# Patient Record
Sex: Male | Born: 1995 | Race: Black or African American | Hispanic: No | Marital: Single | Smoking: Current every day smoker
Health system: Southern US, Community
[De-identification: ages and names within clinical notes are randomized; demographics above are authoritative.]

---
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
## 2016-02-26 ENCOUNTER — Emergency Department: Payer: Commercial Managed Care - POS

## 2016-02-26 ENCOUNTER — Emergency Department
Admission: EM | Admit: 2016-02-26 | Discharge: 2016-02-26 | Disposition: A | Discharge status: Home / Self Care | Payer: Commercial Managed Care - POS | Attending: Emergency Medicine | Admitting: Emergency Medicine

## 2016-02-26 DIAGNOSIS — H5789 Other specified disorders of eye and adnexa: Secondary | ICD-10-CM

## 2016-02-26 DIAGNOSIS — B309 Viral conjunctivitis, unspecified: Secondary | ICD-10-CM

## 2016-02-26 DIAGNOSIS — F17210 Nicotine dependence, cigarettes, uncomplicated: Secondary | ICD-10-CM | POA: Insufficient documentation

## 2016-02-26 MED ORDER — ERYTHROMYCIN 5 MG/GM OP OINT
TOPICAL_OINTMENT | Freq: Four times a day (QID) | OPHTHALMIC | Status: AC
Start: 2016-02-26 — End: 2016-03-02

## 2016-02-26 NOTE — Discharge Instructions (Signed)
Conjunctivitis    You were diagnosed with conjunctivitis. This is also called "pink eye".    Conjunctivitis is an inflammation of the conjunctiva. These are the thin coverings of the white part of the eye and insides of the eyelids. It is caused by many different things. This includes viruses and bacteria. It even includes chemicals. Particles of "junk" that irritate the eye can also be a cause. Viruses are the most common cause.    Symptoms of conjunctivitis include pink eye and redness and drainage. It may feel like there is something in your eye (foreign body sensation). Your lid may get swollen. Your eyelids may also mat or get stuck in the morning.    Conjunctivitis can be very contagious. This means it can easily spread to others. You SHOULD NOT share hygienic items. This includes towels, make-up and tissues. You SHOULD NOT share clothing items. Wash your hands several times a day. Avoid touching your eyes.    Bacterial conjunctivitis is treated with warm compresses. It is also treated with ophthalmic (eye) antibiotics. These antibiotics are topical (not swallowed). Medicine is generally used for 5-7 days.    You SHOULD NOT wear contact lenses while you have conjunctivitis. Wait 48 hours after the infection completely clears up before using them again.    There is no specific follow-up needed. However, if you get any of the problems listed below, you will need a follow-up. If you don t get better as expected, you will also need a follow-up. It is always a good idea to get rechecked by your eye doctor if possible.    YOU SHOULD SEEK MEDICAL ATTENTION IMMEDIATELY, EITHER HERE OR AT THE NEAREST EMERGENCY DEPARTMENT, IF ANY OF THE FOLLOWING OCCURS:   Increasing eye pain.   Vision problems (problems seeing).   Photophobia (light bothering your eyes).   You don t get better after a few days or symptoms get worse at any time.

## 2016-02-26 NOTE — ED Provider Notes (Signed)
EMERGENCY DEPARTMENT HISTORY AND PHYSICAL EXAM    Date Time: 02/26/2016 4:14 PM  Patient Name: Kenneth Moran, 20 y.o., male  ED Provider: Sonda Primes, MD    History of Presenting Illness:     Chief Complaint: right eye redness  History obtained from: Patient.  Onset/Duration: 3 days ago  Quality: occasional dryness in the eye, none now  Severity:minimal  Aggravating Factors: has a URI, along with his fiance  Alleviating Factors: none  Associated Symptoms: rhinorrhea, cough dry  Narrative/Additional Historical Findings:Kenneth Moran is a 20 y.o. male  Who is presenting with the above cc.  He reports occasional crusting from the right eye.  Denies any f/c.  Denies contact lense use.    Nursing notes from this date of service were reviewed.    Past Medical History:   History reviewed. No pertinent past medical history.    Past Surgical History:   History reviewed. No pertinent past surgical history.    Family History:   History reviewed. No pertinent family history.    Social History:     Social History     Social History   . Marital Status: Single     Spouse Name: N/A   . Number of Children: N/A   . Years of Education: N/A     Social History Main Topics   . Smoking status: Current Every Day Smoker -- 0.50 packs/day     Types: Cigarettes   . Smokeless tobacco: Not on file   . Alcohol Use: No   . Drug Use: Yes     Special: Marijuana   . Sexual Activity: Not on file     Other Topics Concern   . Not on file     Social History Narrative   . No narrative on file       Allergies:   No Known Allergies    Medications:   No current facility-administered medications for this encounter.    Current outpatient prescriptions:   .  erythromycin (ROMYCIN) ophthalmic ointment, Place into the right eye every 6 (six) hours. Apply small ribbon to lower lid 4 times a day for 5 days, Disp: 1 g, Rfl: 0    Review of Systems:   Constitutional: No fever or change in activity.  Eyes: positive for eye redness. No eye discharge.  ENT: No ear  pain or sore throat  Cardiovascular: No cp or palpitations  Respiratory: No cough or shortness of breath.  GI: No vomiting or diarrhea.  Genitourinary: Normal urination frequency  Musculoskeletal: No extremity pain or decreased use  Skin: no rash or skin lesions.  Neurologic: Normal level of alertness    All other systems reviewed and are negative    Physical Exam:   ED Triage Vitals   Enc Vitals Group      BP 02/26/16 1556 142/70 mmHg      Heart Rate 02/26/16 1553 113      Resp Rate 02/26/16 1553 18      Temp 02/26/16 1553 98.6 F (37 C)      Temp Source 02/26/16 1553 Tympanic      SpO2 02/26/16 1553 99 %      Weight 02/26/16 1553 86.183 kg      Height 02/26/16 1553 1.778 m      Head Cir --       Peak Flow --       Pain Score --       Pain Loc --  Pain Edu? --       Excl. in GC? --      Constitutional: Vital signs reviewed. Well hydrated, well perfused, and no increased work of breathing. Appearance: nad  Head:  Normocephalic, atraumatic  Eyes: mild conjunctival injection in the right eye. No discharge. EOMI  ENT: Mucous membranes moist, No oral lesions.  Neck: Normal range of motion. Non-tender.  Respiratory/Chest: Clear to auscultation. No respiratory distress.   Cardiovascular: Regular rate and rhythm. No murmur.   Abdomen: Soft and non-tender. No masses or hepatosplenomegaly.  Genitourinary:  UpperExtremity: No edema or cyanosis.  Moving well.  LowerExtremity: No edema or cyanosis.  Moving well.  Neurological: No focal motor deficits by observation. Speech normal. Memory normal.  Skin: Warm and dry. No rash.  Lymphatic: No cervical lymphadenopathy.  Psychiatric: Normal affect. Normal concentration.    Labs:   Labs Reviewed - No data to display      Rads:     Radiology Results (24 Hour)     ** No results found for the last 24 hours. **          MDM and ED Course   DR. Demarko Zeimet  is the primary attending for this patient and has obtained and performed the history, PE, and medical decision making for  this patient.    MDM:    Suspect viral conjunctivitis, but counseled patient on if his symptoms became worse, with signs of bacterial conjunctivitis, he can start erythromycin ophthalmic ointment.  Visual acuity here was within normal limits.  Given referral to ophthalmologist.    Counseled patient on hand hygiene as well.    Assessment/Plan:   Results and instructions reviewed at the bedside with patient and family.    Clinical Impression  Final diagnoses:   Viral conjunctivitis of right eye   Redness of eye, right       Disposition  ED Disposition     Discharge Kenneth Moran discharge to home/self care.    Condition at disposition: Stable            Prescriptions  New Prescriptions    ERYTHROMYCIN (ROMYCIN) OPHTHALMIC OINTMENT    Place into the right eye every 6 (six) hours. Apply small ribbon to lower lid 4 times a day for 5 days           Signed by: Vito Backers, MD  02/26/16 [phone]

## 2016-02-26 NOTE — ED Notes (Signed)
Pt reports sore throat, redness and itchy eye, congestion x 3 days - reports he has been around family members who have been sick

## 2016-06-05 ENCOUNTER — Emergency Department
Admission: EM | Admit: 2016-06-05 | Discharge: 2016-06-05 | Disposition: A | Discharge status: Home / Self Care | Payer: Commercial Managed Care - POS | Attending: Emergency Medicine | Admitting: Emergency Medicine

## 2016-06-05 ENCOUNTER — Emergency Department: Payer: Commercial Managed Care - POS

## 2016-06-05 ENCOUNTER — Observation Stay: Payer: Commercial Managed Care - POS

## 2016-06-05 DIAGNOSIS — F1910 Other psychoactive substance abuse, uncomplicated: Secondary | ICD-10-CM | POA: Insufficient documentation

## 2016-06-05 DIAGNOSIS — F17210 Nicotine dependence, cigarettes, uncomplicated: Secondary | ICD-10-CM | POA: Insufficient documentation

## 2016-06-05 DIAGNOSIS — R4182 Altered mental status, unspecified: Secondary | ICD-10-CM | POA: Insufficient documentation

## 2016-06-05 LAB — COMPREHENSIVE METABOLIC PANEL
ALT: 24 U/L (ref 0–55)
AST (SGOT): 34 U/L (ref 5–34)
Albumin/Globulin Ratio: 1.2 (ref 0.9–2.2)
Albumin: 4.2 g/dL (ref 3.5–5.0)
Alkaline Phosphatase: 57 U/L (ref 38–106)
Anion Gap: 9 (ref 5.0–15.0)
BUN: 18 mg/dL (ref 9–28)
Bilirubin, Total: 0.6 mg/dL (ref 0.2–1.2)
CO2: 28 mEq/L (ref 22–29)
Calcium: 9.7 mg/dL (ref 8.5–10.5)
Chloride: 109 mEq/L (ref 100–111)
Creatinine: 1.6 mg/dL — ABNORMAL HIGH (ref 0.7–1.3)
Globulin: 3.4 g/dL (ref 2.0–3.6)
Glucose: 111 mg/dL — ABNORMAL HIGH (ref 70–100)
Potassium: 3.5 mEq/L (ref 3.5–5.1)
Protein, Total: 7.6 g/dL (ref 6.0–8.3)
Sodium: 146 mEq/L — ABNORMAL HIGH (ref 136–145)

## 2016-06-05 LAB — URINALYSIS, REFLEX TO MICROSCOPIC EXAM IF INDICATED
Bilirubin, UA: NEGATIVE
Blood, UA: NEGATIVE
Glucose, UA: NEGATIVE
Ketones UA: 20 — AB
Leukocyte Esterase, UA: NEGATIVE
Nitrite, UA: NEGATIVE
Protein, UR: 100 — AB
Specific Gravity UA: 1.035 (ref 1.001–1.035)
Urine pH: 5 (ref 5.0–8.0)
Urobilinogen, UA: 2 mg/dL

## 2016-06-05 LAB — CBC AND DIFFERENTIAL
Absolute NRBC: 0 10*3/uL
Basophils Absolute Automated: 0.02 10*3/uL (ref 0.00–0.20)
Basophils Automated: 0.3 %
Eosinophils Absolute Automated: 0.01 10*3/uL (ref 0.00–0.70)
Eosinophils Automated: 0.1 %
Hematocrit: 40.9 % — ABNORMAL LOW (ref 42.0–52.0)
Hgb: 14.2 g/dL (ref 13.0–17.0)
Immature Granulocytes Absolute: 0.02 10*3/uL
Immature Granulocytes: 0.3 %
Lymphocytes Absolute Automated: 1.03 10*3/uL (ref 0.50–4.40)
Lymphocytes Automated: 13.5 %
MCH: 31.5 pg (ref 28.0–32.0)
MCHC: 34.7 g/dL (ref 32.0–36.0)
MCV: 90.7 fL (ref 80.0–100.0)
MPV: 9.9 fL (ref 9.4–12.3)
Monocytes Absolute Automated: 0.61 10*3/uL (ref 0.00–1.20)
Monocytes: 8 %
Neutrophils Absolute: 5.94 10*3/uL (ref 1.80–8.10)
Neutrophils: 77.8 %
Nucleated RBC: 0 /100 WBC (ref 0.0–1.0)
Platelets: 223 10*3/uL (ref 140–400)
RBC: 4.51 10*6/uL — ABNORMAL LOW (ref 4.70–6.00)
RDW: 13 % (ref 12–15)
WBC: 7.63 10*3/uL (ref 3.50–10.80)

## 2016-06-05 LAB — GFR: EGFR: >60

## 2016-06-05 LAB — SALICYLATE LEVEL: Salicylate Level: <5 mg/dL — ABNORMAL LOW (ref 15.0–30.0)

## 2016-06-05 LAB — GLUCOSE WHOLE BLOOD - POCT: Whole Blood Glucose POCT: 127 mg/dL — ABNORMAL HIGH (ref 70–100)

## 2016-06-05 LAB — RAPID DRUG SCREEN, URINE
Barbiturate Screen, UR: NEGATIVE
Benzodiazepine Screen, UR: POSITIVE — AB
Cannabinoid Screen, UR: POSITIVE — AB
Cocaine, UR: NEGATIVE
Opiate Screen, UR: NEGATIVE
PCP Screen, UR: NEGATIVE
Urine Amphetamine Screen: NEGATIVE

## 2016-06-05 LAB — TROPONIN I: Troponin I: <0.01 ng/mL (ref 0.00–0.09)

## 2016-06-05 LAB — ETHANOL: Alcohol: NOT DETECTED mg/dL

## 2016-06-05 LAB — ACETAMINOPHEN LEVEL: Acetaminophen Level: <7 ug/mL — ABNORMAL LOW (ref 10–30)

## 2016-06-05 MED ORDER — SODIUM CHLORIDE 0.9 % IV BOLUS
1000.0000 mL | Freq: Once | INTRAVENOUS | Status: AC
Start: 2016-06-05 — End: 2016-06-05
  Administered 2016-06-05: 1000 mL via INTRAVENOUS

## 2016-06-05 NOTE — ED Provider Notes (Addendum)
EMERGENCY DEPARTMENT NOTE    Physician/Midlevel provider first contact with patient: 06/05/16 1211         HISTORY OF PRESENT ILLNESS   Historian:Patient  Translator Used: No    Chief Complaint: Altered Mental Status      20 y.o. male who presents altered and accompanied by [facility]. Per EMS and [place] [employer], pt was found earlier today running in front of traffic and banging his head on the asphalt. Pt with prior hx of run-ins with the police. Pt was agitated and requiring IM sedation (versed and ketamine). No deformities noted on exam. Pt was febrile, tachycardic, and hypertensive.     1. Location of symptoms:n/a  2. Onset of symptoms: n/a  3. What was patient doing when symptoms started (Context): see above  4. Severity: moderate  5. Timing: n/a  6. Activities that worsen symptoms: n/a  7. Activities that improve symptoms: n/a  8. Quality: n/a  9. Radiation of symptoms: no  10. Associated signs and Symptoms: see above  11. Are symptoms worsening? yes  MEDICAL HISTORY     Past Medical History:  History reviewed. No pertinent past medical history.    Past Surgical History:  History reviewed. No pertinent surgical history.    Social History:  Social History     Social History   . Marital status: Single     Spouse name: N/A   . Number of children: N/A   . Years of education: N/A     Occupational History   . Not on file.     Social History Main Topics   . Smoking status: Current Every Day Smoker     Packs/day: 0.50     Types: Cigarettes   . Smokeless tobacco: Not on file   . Alcohol use No   . Drug use:      Types: Marijuana   . Sexual activity: Not on file     Other Topics Concern   . Not on file     Social History Narrative   . No narrative on file       Family History:  No family history on file.    Outpatient Medication:  There are no discharge medications for this patient.        REVIEW OF SYSTEMS   Review of Systems  Unable to obtain due to clinical status  PHYSICAL EXAM     ED Triage Vitals [06/05/16 1209]   Enc  Vitals Group      BP 137/71      Heart Rate 87      Resp Rate 14      Temp 99.8 F (37.7 C)      Temp src       SpO2 98 %      Weight 88 kg      Height       Head Circumference       Peak Flow       Pain Score       Pain Loc       Pain Edu?       Excl. in GC?        Constitutional: Vital signs reviewed. Minimally responsive to sternal rub. Localized, opens eyes, no respiratory distress noted  Head:  Normocephalic, atraumatic  Eyes: PERRL, normal conjunctiva bilaterally, EOMI  ENT: Mucous membranes moist.  .  Neck: Normal range of motion. Non-tender.   Respiratory/Chest: clear to auscultation. No work of breathing. No tachypnea..  Cardiovascular: Regular  rate and rhythm. No murmur.   Abdomen: Soft and nontender in all quadrants. No guarding or rebound. No masses or hepatosplenomegaly. -----.  UpperExtremity: No edema or cyanosis.  LowerExtremity: No edema or cyanosis.  Neurological: Awake and alert. No focal motor deficits by observation.  Skin: Warm and dry. No rash.  Lymphatic: No cervical lymphadenopathy.    MEDICAL DECISION MAKING     20 y.o. male who presents altered and accompanied by [place] [employer]    Concern for substance abuse  IVF  Labs including urine drug screen, tylenol and salicylate level pending  Observation in ER   CT head negative for acute pathology     Reassessment/Updates:  - pt sternal rubbed, however pt opens eyes, localized and falls right back asleep. Observed in ER. Will admit for observation.     En route to his room, pt woke up, and got out of bed. Stated to myself and RN he would like to leave the hospital. Pt admits to overdosing on codeine syrup earlier today. IV taken out by RN. Pt walked calmly out the hospital.     DISCUSSION        Vital Signs: Reviewed the patient?s vital signs.   Nursing Notes: Reviewed and utilized available nursing notes.  Medical Records Reviewed: Reviewed available past medical records.  Counseling: The emergency provider has spoken with the patient and discussed  today?s findings, in addition to providing specific details for the plan of care.  Questions are answered and there is agreement with the plan.      CARDIAC STUDIES    The following cardiac studies were independently interpreted by the Emergency Medicine Physician.  For full cardiac study results please see chart.    Monitor Strip  Interpreted by ED Physician  Rate: 64  Rhythm: NSR   ST Changes: none    EKG Interpretation:  Signed and interpreted by ED Physician   Time Interpreted: 12:15  Comparison: none  Rate: 87  Rhythm: NSR  Axis: right  Intervals: normal  Blocks: none  ST segments: no acute ST changes  Interpretation: NSR    IMAGING STUDIES    The following imaging studies were independently interpreted by the Emergency Medicine Physician.  For full imaging study results please see chart.    CXR- no opacities noted, no pleural effusions, no pneumothorax, trachea midline    PULSE OXIMETRY    Oxygen Saturation by Pulse Oximetry: 97%  Interventions: none  Interpretation:  normal    EMERGENCY DEPT. MEDICATIONS      ED Medication Orders     Start Ordered     Status Ordering Provider    06/05/16 1213 06/05/16 1212  sodium chloride 0.9 % bolus 1,000 mL  Once     Route: Intravenous  Ordered Dose: 1,000 mL     Last MAR action:  Stopped [id number], Nicolaus Andel          LABORATORY RESULTS    Ordered and independently interpreted AVAILABLE laboratory tests. Please see results section in chart for full details.  Results for orders placed or performed during the hospital encounter of 06/05/16   CBC with differential   Result Value Ref Range    WBC 7.63 3.50 - 10.80 x10 3/uL    Hgb 14.2 13.0 - 17.0 g/dL    Hematocrit 16.1 (L) 42.0 - 52.0 %    Platelets 223 140 - 400 x10 3/uL    RBC 4.51 (L) 4.70 - 6.00 x10 6/uL    MCV 90.7 80.0 -  100.0 fL    MCH 31.5 28.0 - 32.0 pg    MCHC 34.7 32.0 - 36.0 g/dL    RDW 13 12 - 15 %    MPV 9.9 9.4 - 12.3 fL    Neutrophils 77.8 None %    Lymphocytes Automated 13.5 None %    Monocytes 8.0 None %     Eosinophils Automated 0.1 None %    Basophils Automated 0.3 None %    Immature Granulocyte 0.3 None %    Nucleated RBC 0.0 0.0 - 1.0 /100 WBC    Neutrophils Absolute 5.94 1.80 - 8.10 x10 3/uL    Abs Lymph Automated 1.03 0.50 - 4.40 x10 3/uL    Abs Mono Automated 0.61 0.00 - 1.20 x10 3/uL    Abs Eos Automated 0.01 0.00 - 0.70 x10 3/uL    Absolute Baso Automated 0.02 0.00 - 0.20 x10 3/uL    Absolute Immature Granulocyte 0.02 0 x10 3/uL    Absolute NRBC 0.00 0 x10 3/uL   Comprehensive Metabolic Panel (CMP)   Result Value Ref Range    Glucose 111 (H) 70 - 100 mg/dL    BUN 18 9 - 28 mg/dL    Creatinine 1.6 (H) 0.7 - 1.3 mg/dL    Sodium 956 (H) 213 - 145 mEq/L    Potassium 3.5 3.5 - 5.1 mEq/L    Chloride 109 100 - 111 mEq/L    CO2 28 22 - 29 mEq/L    Calcium 9.7 8.5 - 10.5 mg/dL    Protein, Total 7.6 6.0 - 8.3 g/dL    Albumin 4.2 3.5 - 5.0 g/dL    AST (SGOT) 34 5 - 34 U/L    ALT 24 0 - 55 U/L    Alkaline Phosphatase 57 38 - 106 U/L    Bilirubin, Total 0.6 0.2 - 1.2 mg/dL    Globulin 3.4 2.0 - 3.6 g/dL    Albumin/Globulin Ratio 1.2 0.9 - 2.2    Anion Gap 9.0 5.0 - 15.0   Alcohol (Ethanol) Level   Result Value Ref Range    Alcohol None Detected None Detected mg/dL   Acetaminophen level   Result Value Ref Range    Acetaminophen Level <7 (L) 10 - 30 ug/mL   Salicylate Level   Result Value Ref Range    Salicylate Level <5.0 (L) 15.0 - 30.0 mg/dL   Rapid drug screen, urine   Result Value Ref Range    Amphetamine Screen, UR Negative Negative    Barbiturate Screen, UR Negative Negative    Benzodiazepine Screen, UR Positive (A) Negative    Cannabinoid Screen, UR Positive (A) Negative    Cocaine, UR Negative Negative    Opiate Screen, UR Negative Negative    PCP Screen, UR Negative Negative   GFR   Result Value Ref Range    EGFR >60.0    Troponin I   Result Value Ref Range    Troponin I <0.01 0.00 - 0.09 ng/mL   UA, Reflex to Microscopic   Result Value Ref Range    Urine Type ucc     Color, UA Amber (A) Clear - Yellow    Clarity,  UA Clear Clear - Hazy    Specific Gravity UA 1.035 1.001 - 1.035    Urine pH 5.0 5.0 - 8.0    Leukocyte Esterase, UA Negative Negative    Nitrite, UA Negative Negative    Protein, UR 100 (A) Negative    Glucose, UA Negative Negative  Ketones UA 20 (A) Negative    Urobilinogen, UA 2.0 0.2 - 2.0 mg/dL    Bilirubin, UA Negative Negative    Blood, UA Negative Negative    RBC, UA 0 - 2 0 - 5 /hpf    WBC, UA 0 - 5 0 - 5 /hpf    Squamous Epithelial Cells, Urine 0 - 5 0 - 25 /hpf    Hyaline Casts, UA 11 - 25 (A) 0 - 5 /lpf    Urine Mucus Present None   Glucose Whole Blood - POCT   Result Value Ref Range    POCT - Glucose Whole blood 127 (H) 70 - 100 mg/dL   ECG 12 Lead   Result Value Ref Range    Ventricular Rate 87 BPM    Atrial Rate 87 BPM    P-R Interval 156 ms    QRS Duration 92 ms    Q-T Interval 380 ms    QTC Calculation (Bezet) 457 ms    P Axis 75 degrees    R Axis 93 degrees    T Axis -1 degrees         DIAGNOSIS      Diagnosis:  Final diagnoses:   Altered mental status   Substance abuse       Disposition:  ED Disposition     ED Disposition Condition Date/Time Comment    [place]  Mon Jun 08, 2016 10:18 PM             Prescriptions:  There are no discharge medications for this patient.           Rulon Abide, MD  06/08/16 2219       Rulon Abide, MD  06/08/16 2243

## 2016-06-05 NOTE — ED Triage Notes (Signed)
PT BIBA - AMS - #18 LEFT FA / DEXI =156.  250MG  KETAMINE AND VERSED 5MG  IM GIVEN BY EMS

## 2016-06-06 NOTE — ED Notes (Signed)
Pt became alert during transportation and decided to leave. IV was removed and patient exit the hospital safely.

## 2016-06-07 LAB — ECG 12-LEAD
Atrial Rate: 87 {beats}/min
P Axis: 75 degrees
P-R Interval: 156 ms
Q-T Interval: 380 ms
QRS Duration: 92 ms
QTC Calculation (Bezet): 457 ms
R Axis: 93 degrees
T Axis: -1 degrees
Ventricular Rate: 87 {beats}/min

## 2016-06-26 ENCOUNTER — Emergency Department
Admission: EM | Admit: 2016-06-26 | Discharge: 2016-06-27 | Disposition: A | Discharge status: Home / Self Care | Payer: Commercial Managed Care - POS | Attending: Emergency Medicine | Admitting: Emergency Medicine

## 2016-06-26 ENCOUNTER — Emergency Department: Payer: Commercial Managed Care - POS

## 2016-06-26 DIAGNOSIS — S0181XA Laceration without foreign body of other part of head, initial encounter: Secondary | ICD-10-CM | POA: Insufficient documentation

## 2016-06-26 DIAGNOSIS — S41111A Laceration without foreign body of right upper arm, initial encounter: Secondary | ICD-10-CM

## 2016-06-26 DIAGNOSIS — F17210 Nicotine dependence, cigarettes, uncomplicated: Secondary | ICD-10-CM | POA: Insufficient documentation

## 2016-06-26 MED ORDER — LIDOCAINE-EPINEPHRINE 1 %-1:100000 IJ SOLN
10.0000 mL | Freq: Once | INTRAMUSCULAR | Status: AC
Start: 2016-06-26 — End: 2016-06-27
  Administered 2016-06-27: 10 mL via INTRADERMAL
  Filled 2016-06-26: qty 20
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
## 2016-06-27 MED ORDER — IBUPROFEN 600 MG PO TABS
600.0000 mg | ORAL_TABLET | Freq: Once | ORAL | Status: AC
Start: 2016-06-27 — End: 2016-06-27
  Administered 2016-06-27: 600 mg via ORAL
  Filled 2016-06-27: qty 1

## 2016-06-27 MED ORDER — BACITRACIN ZINC 500 UNIT/GM EX OINT
TOPICAL_OINTMENT | Freq: Once | CUTANEOUS | Status: AC
Start: 2016-06-27 — End: 2016-06-27
  Filled 2016-06-27: qty 0.9

## 2016-06-27 MED ORDER — IBUPROFEN 600 MG PO TABS
600.0000 mg | ORAL_TABLET | Freq: Four times a day (QID) | ORAL | 0 refills | Status: AC | PRN
Start: 2016-06-27 — End: ?

## 2016-06-27 NOTE — Discharge Instructions (Signed)
Laceration, General Wound Care    Use the following wound care instructions for your laceration (cut):   Keep the wound clean and dry for the next 24 hours. Avoid excessive moisture. You can wash the wound gently with soap and water. Then apply a dry bandage.   DO NOT allow your wound to soak in water (don't do the dishes or go swimming, for example). You can shower, but do not rub your stitches too hard. Let the wound dry before putting another bandage on.   Take off old dressings every day. Then put on a clean, dry dressing.   If the dressing sticks to the wound, slightly moisten it with water. This way, it can come off more easily.   To help remove a scab, cleanse the area with a mixture of half hydrogen peroxide and half water. This will also help us to take out the sutures when they are ready to be taken out.   Let the area dry thoroughly.   Unless you receive instructions not to do so, you can place a thin layer of antibiotic ointment over the wound. You can buy Polysporin, Bacitracin, or Neosporin at the store. Neosporin can sometimes cause irritation to your skin. If this happens, stop using it and switch to another topical (surface) antibiotic.   If needed, put a clean, dry bandage over the wound to protect it.    Keep the injured area elevated (lifted) for the next 24 hours. This will decrease swelling and pain. You may also want to put ice on the area. Place some ice cubes in a re-sealable (Ziploc) bag and add some water. Put a thin washcloth between the bag and the skin. Apply the ice bag to the area for at least 20 minutes. Do this at least 4 times per day. It is okay to do this more often than directed. You can also do it for longer than directed. NEVER APPLY ICE DIRECTLY TO THE SKIN.    If you had a local anesthetic, it will wear off in about 2 hours. Until then, be careful not to hurt yourself because of having less feeling in the area.    Not all lacerations (cuts) will need  antibiotics. Your doctor may have decided that you need antibiotics to prevent an infection. Be sure to fill the prescription and take all medicines as directed.    If your doctor gave you a prescription for pain medicine, fill the prescription and use the medicine as directed.    YOU SHOULD SEEK MEDICAL ATTENTION IMMEDIATELY, EITHER HERE OR AT THE NEAREST EMERGENCY DEPARTMENT, IF ANY OF THE FOLLOWING OCCURS:   You see redness or swelling.   There are red streaks or there is redness around the wound.   The wound smells bad or has a lot of drainage.   You have fever (temperature higher than 100.4F or 38C), chills, worse pain and / or swelling.

## 2016-07-07 ENCOUNTER — Emergency Department
Admission: EM | Admit: 2016-07-07 | Discharge: 2016-07-07 | Disposition: A | Discharge status: Home / Self Care | Payer: Commercial Managed Care - POS | Attending: Emergency Medicine | Admitting: Emergency Medicine

## 2016-07-07 DIAGNOSIS — IMO0002 Reserved for concepts with insufficient information to code with codable children: Secondary | ICD-10-CM

## 2016-07-07 DIAGNOSIS — Z4802 Encounter for removal of sutures: Secondary | ICD-10-CM | POA: Insufficient documentation

## 2016-07-07 NOTE — ED Notes (Addendum)
20 yr old male, p/w in ED for suture removal to R arm. No specific number of sutures (to arm) listed on previous visit. Per Dr. Leane Platt, staff should remove every other suture, and send pt home with instructions on how to remove remaining sutures in 2 days from now. 15 intact sutures were counted, and 8 removed. No s/s of infection observed.

## 2016-07-08 NOTE — Discharge Instructions (Signed)
Observe  Remove remainder of sutures in two days

## 2016-07-08 NOTE — ED Provider Notes (Signed)
Physician/Midlevel provider first contact with patient: 07/07/16 2238         History     Chief Complaint   Patient presents with   . Suture / Staple Removal     HPI     Time seen:    Historian: patient    Chief complaint: SR    No past medical history on file.    No past surgical history on file.    No family history on file.    Social  Social History   Substance Use Topics   . Smoking status: Current Every Day Smoker     Packs/day: 0.50     Types: Cigarettes   . Smokeless tobacco: Never Used   . Alcohol use No   .   No Known Allergies    Home Medications             ibuprofen (ADVIL,MOTRIN) 600 MG tablet     Take 1 tablet (600 mg total) by mouth every 6 (six) hours as needed for Pain or Fever.         Review of Systems   Skin: Negative for color change and rash.   Neurological: Negative for weakness and numbness.     Physical Exam       Physical Exam   Skin: Skin is warm and dry.   Wound over right biceps area has healed well with   NSOI, with [id number]-[id number]     MDM and ED Course     ED Medication Orders     None        SR by RN     MDM    ED Course        Procedures    Clinical Impression & Disposition     Clinical Impression  Final diagnoses:   Visit for suture removal   Wound healing well on examination     ED Disposition     ED Disposition Condition Date/Time Comment    Discharge  Wed Jul 08, 2016  7:37 PM Neita Goodnight discharge to home/self care.    Condition at disposition: Stable                       Terri Skains, MD  07/08/16 [phone]

## 2021-03-10 ENCOUNTER — Emergency Department (HOSPITAL_COMMUNITY)
Admission: EM | Admit: 2021-03-10 | Discharge: 2021-03-10 | Disposition: A | Discharge status: Home / Self Care | Payer: No Typology Code available for payment source | Attending: Emergency Medicine | Admitting: Emergency Medicine

## 2021-03-10 ENCOUNTER — Emergency Department (HOSPITAL_COMMUNITY): Payer: No Typology Code available for payment source

## 2021-03-10 DIAGNOSIS — R0789 Other chest pain: Secondary | ICD-10-CM | POA: Diagnosis not present

## 2021-03-10 DIAGNOSIS — M7918 Myalgia, other site: Secondary | ICD-10-CM

## 2021-03-10 DIAGNOSIS — R519 Headache, unspecified: Secondary | ICD-10-CM | POA: Insufficient documentation

## 2021-03-10 DIAGNOSIS — M545 Low back pain, unspecified: Secondary | ICD-10-CM | POA: Insufficient documentation

## 2021-03-10 DIAGNOSIS — R079 Chest pain, unspecified: Secondary | ICD-10-CM | POA: Diagnosis present

## 2021-03-10 DIAGNOSIS — R000 Tachycardia, unspecified: Secondary | ICD-10-CM | POA: Diagnosis not present

## 2021-03-10 DIAGNOSIS — Y92410 Unspecified street and highway as the place of occurrence of the external cause: Secondary | ICD-10-CM | POA: Diagnosis not present

## 2021-03-10 DIAGNOSIS — R1011 Right upper quadrant pain: Secondary | ICD-10-CM | POA: Insufficient documentation

## 2021-03-10 MED ORDER — ACETAMINOPHEN 500 MG PO TABS
1000.0000 mg | ORAL_TABLET | Freq: Once | ORAL | Status: AC
  Administered 2021-03-10: 1000 mg via ORAL
  Filled 2021-03-10: qty 2

## 2021-03-10 MED ORDER — IBUPROFEN 800 MG PO TABS
800.0000 mg | ORAL_TABLET | Freq: Once | ORAL | Status: AC
  Administered 2021-03-10: 800 mg via ORAL
  Filled 2021-03-10: qty 1

## 2021-03-10 NOTE — ED Notes (Signed)
Pt returned from CT °
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
## 2021-03-10 NOTE — ED Notes (Signed)
Pt transported to CT ?
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
## 2022-06-22 ENCOUNTER — Encounter (HOSPITAL_COMMUNITY): Admission: EM | Disposition: A | Discharge status: Home / Self Care | Payer: Self-pay | Source: Home / Self Care

## 2022-06-22 ENCOUNTER — Inpatient Hospital Stay (HOSPITAL_COMMUNITY): Payer: Self-pay

## 2022-06-22 ENCOUNTER — Emergency Department (HOSPITAL_COMMUNITY): Payer: Self-pay

## 2022-06-22 ENCOUNTER — Emergency Department (HOSPITAL_COMMUNITY): Payer: Self-pay | Admitting: Certified Registered Nurse Anesthetist

## 2022-06-22 ENCOUNTER — Other Ambulatory Visit: Payer: Self-pay

## 2022-06-22 ENCOUNTER — Inpatient Hospital Stay (HOSPITAL_COMMUNITY): Payer: Self-pay | Admitting: Certified Registered Nurse Anesthetist

## 2022-06-22 ENCOUNTER — Inpatient Hospital Stay (HOSPITAL_COMMUNITY)
Admission: EM | Admit: 2022-06-22 | Discharge: 2022-07-10 | DRG: 957 | Disposition: A | Discharge status: Home / Self Care | Payer: Self-pay | Attending: General Surgery | Admitting: General Surgery

## 2022-06-22 ENCOUNTER — Encounter (HOSPITAL_COMMUNITY): Payer: Self-pay | Admitting: Certified Registered Nurse Anesthetist

## 2022-06-22 DIAGNOSIS — S31619A Laceration without foreign body of abdominal wall, unspecified quadrant with penetration into peritoneal cavity, initial encounter: Secondary | ICD-10-CM

## 2022-06-22 DIAGNOSIS — S21312A Laceration without foreign body of left front wall of thorax with penetration into thoracic cavity, initial encounter: Secondary | ICD-10-CM

## 2022-06-22 DIAGNOSIS — S21332A Puncture wound without foreign body of left front wall of thorax with penetration into thoracic cavity, initial encounter: Secondary | ICD-10-CM

## 2022-06-22 DIAGNOSIS — S0101XA Laceration without foreign body of scalp, initial encounter: Secondary | ICD-10-CM

## 2022-06-22 DIAGNOSIS — S36439A Laceration of unspecified part of small intestine, initial encounter: Secondary | ICD-10-CM

## 2022-06-22 DIAGNOSIS — T07XXXA Unspecified multiple injuries, initial encounter: Principal | ICD-10-CM

## 2022-06-22 DIAGNOSIS — T148XXA Other injury of unspecified body region, initial encounter: Secondary | ICD-10-CM | POA: Diagnosis present

## 2022-06-22 DIAGNOSIS — Z9889 Other specified postprocedural states: Secondary | ICD-10-CM

## 2022-06-22 DIAGNOSIS — S61412A Laceration without foreign body of left hand, initial encounter: Secondary | ICD-10-CM

## 2022-06-22 DIAGNOSIS — S36438A Laceration of other part of small intestine, initial encounter: Secondary | ICD-10-CM

## 2022-06-22 DIAGNOSIS — Y908 Blood alcohol level of 240 mg/100 ml or more: Secondary | ICD-10-CM | POA: Diagnosis present

## 2022-06-22 DIAGNOSIS — T794XXA Traumatic shock, initial encounter: Secondary | ICD-10-CM | POA: Diagnosis present

## 2022-06-22 DIAGNOSIS — S25512A Laceration of intercostal blood vessels, left side, initial encounter: Principal | ICD-10-CM | POA: Diagnosis present

## 2022-06-22 DIAGNOSIS — Y92009 Unspecified place in unspecified non-institutional (private) residence as the place of occurrence of the external cause: Secondary | ICD-10-CM

## 2022-06-22 DIAGNOSIS — J15211 Pneumonia due to Methicillin susceptible Staphylococcus aureus: Secondary | ICD-10-CM | POA: Diagnosis not present

## 2022-06-22 DIAGNOSIS — D62 Acute posthemorrhagic anemia: Secondary | ICD-10-CM | POA: Diagnosis present

## 2022-06-22 DIAGNOSIS — S271XXA Traumatic hemothorax, initial encounter: Secondary | ICD-10-CM | POA: Diagnosis present

## 2022-06-22 DIAGNOSIS — J9601 Acute respiratory failure with hypoxia: Secondary | ICD-10-CM | POA: Diagnosis not present

## 2022-06-22 DIAGNOSIS — S0181XA Laceration without foreign body of other part of head, initial encounter: Secondary | ICD-10-CM | POA: Diagnosis present

## 2022-06-22 DIAGNOSIS — F19231 Other psychoactive substance dependence with withdrawal delirium: Secondary | ICD-10-CM | POA: Diagnosis not present

## 2022-06-22 DIAGNOSIS — J150 Pneumonia due to Klebsiella pneumoniae: Secondary | ICD-10-CM | POA: Diagnosis not present

## 2022-06-22 DIAGNOSIS — Q438 Other specified congenital malformations of intestine: Secondary | ICD-10-CM

## 2022-06-22 DIAGNOSIS — F10129 Alcohol abuse with intoxication, unspecified: Secondary | ICD-10-CM | POA: Diagnosis not present

## 2022-06-22 DIAGNOSIS — Z9911 Dependence on respirator [ventilator] status: Secondary | ICD-10-CM

## 2022-06-22 DIAGNOSIS — Z23 Encounter for immunization: Secondary | ICD-10-CM

## 2022-06-22 DIAGNOSIS — E872 Acidosis, unspecified: Secondary | ICD-10-CM | POA: Diagnosis present

## 2022-06-22 DIAGNOSIS — R451 Restlessness and agitation: Secondary | ICD-10-CM | POA: Diagnosis present

## 2022-06-22 DIAGNOSIS — S61012A Laceration without foreign body of left thumb without damage to nail, initial encounter: Secondary | ICD-10-CM | POA: Diagnosis present

## 2022-06-22 DIAGNOSIS — R443 Hallucinations, unspecified: Secondary | ICD-10-CM | POA: Diagnosis not present

## 2022-06-22 DIAGNOSIS — S21112A Laceration without foreign body of left front wall of thorax without penetration into thoracic cavity, initial encounter: Secondary | ICD-10-CM | POA: Diagnosis present

## 2022-06-22 HISTORY — PX: LAPAROTOMY: SHX154

## 2022-06-22 HISTORY — PX: WOUND EXPLORATION: SHX6188

## 2022-06-22 HISTORY — PX: CYST REMOVAL HAND: SHX6279

## 2022-06-22 HISTORY — PX: HEMATOMA EVACUATION: SHX5118

## 2022-06-22 LAB — COMPREHENSIVE METABOLIC PANEL
ALT: 30 U/L (ref 0–44)
ALT: 16 U/L (ref 0–44)
ALT: 18 U/L (ref 0–44)
AST: 45 U/L — ABNORMAL HIGH (ref 15–41)
AST: 21 U/L (ref 15–41)
AST: 23 U/L (ref 15–41)
Albumin: 3.8 g/dL (ref 3.5–5.0)
Albumin: 2.5 g/dL — ABNORMAL LOW (ref 3.5–5.0)
Albumin: 3.1 g/dL — ABNORMAL LOW (ref 3.5–5.0)
Alkaline Phosphatase: 43 U/L (ref 38–126)
Alkaline Phosphatase: 27 U/L — ABNORMAL LOW (ref 38–126)
Alkaline Phosphatase: 34 U/L — ABNORMAL LOW (ref 38–126)
Anion gap: 15 (ref 5–15)
Anion gap: 9 (ref 5–15)
Anion gap: 11 (ref 5–15)
BUN: 11 mg/dL (ref 6–20)
BUN: 10 mg/dL (ref 6–20)
BUN: 8 mg/dL (ref 6–20)
CO2: 17 mmol/L — ABNORMAL LOW (ref 22–32)
CO2: 16 mmol/L — ABNORMAL LOW (ref 22–32)
CO2: 22 mmol/L (ref 22–32)
Calcium: 8.4 mg/dL — ABNORMAL LOW (ref 8.9–10.3)
Calcium: 7.4 mg/dL — ABNORMAL LOW (ref 8.9–10.3)
Calcium: 8.2 mg/dL — ABNORMAL LOW (ref 8.9–10.3)
Chloride: 109 mmol/L (ref 98–111)
Chloride: 113 mmol/L — ABNORMAL HIGH (ref 98–111)
Chloride: 110 mmol/L (ref 98–111)
Creatinine, Ser: 1.38 mg/dL — ABNORMAL HIGH (ref 0.61–1.24)
Creatinine, Ser: 0.96 mg/dL (ref 0.61–1.24)
Creatinine, Ser: 0.88 mg/dL (ref 0.61–1.24)
GFR, Estimated: >60 mL/min (ref >60)
GFR, Estimated: >60 mL/min (ref >60)
GFR, Estimated: >60 mL/min (ref >60)
Glucose, Bld: 171 mg/dL — ABNORMAL HIGH (ref 70–99)
Glucose, Bld: 127 mg/dL — ABNORMAL HIGH (ref 70–99)
Glucose, Bld: 90 mg/dL (ref 70–99)
Potassium: 3.5 mmol/L (ref 3.5–5.1)
Potassium: 3.9 mmol/L (ref 3.5–5.1)
Potassium: 3.7 mmol/L (ref 3.5–5.1)
Sodium: 141 mmol/L (ref 135–145)
Sodium: 138 mmol/L (ref 135–145)
Sodium: 143 mmol/L (ref 135–145)
Total Bilirubin: 0.8 mg/dL (ref 0.3–1.2)
Total Bilirubin: 0.5 mg/dL (ref 0.3–1.2)
Total Bilirubin: 2.1 mg/dL — ABNORMAL HIGH (ref 0.3–1.2)
Total Protein: 6.6 g/dL (ref 6.5–8.1)
Total Protein: 3.9 g/dL — ABNORMAL LOW (ref 6.5–8.1)
Total Protein: 4.9 g/dL — ABNORMAL LOW (ref 6.5–8.1)

## 2022-06-22 LAB — I-STAT CHEM 8, ED
BUN: 13 mg/dL (ref 6–20)
Calcium, Ion: 1.12 mmol/L — ABNORMAL LOW (ref 1.15–1.40)
Chloride: 105 mmol/L (ref 98–111)
Creatinine, Ser: 1.6 mg/dL — ABNORMAL HIGH (ref 0.61–1.24)
Glucose, Bld: 170 mg/dL — ABNORMAL HIGH (ref 70–99)
HCT: 37 % — ABNORMAL LOW (ref 39.0–52.0)
Hemoglobin: 12.6 g/dL — ABNORMAL LOW (ref 13.0–17.0)
Potassium: 3.6 mmol/L (ref 3.5–5.1)
Sodium: 143 mmol/L (ref 135–145)
TCO2: 20 mmol/L — ABNORMAL LOW (ref 22–32)

## 2022-06-22 LAB — POCT I-STAT 7, (LYTES, BLD GAS, ICA,H+H)
Acid-base deficit: 6 mmol/L — ABNORMAL HIGH (ref 0.0–2.0)
Acid-base deficit: 9 mmol/L — ABNORMAL HIGH (ref 0.0–2.0)
Acid-base deficit: 8 mmol/L — ABNORMAL HIGH (ref 0.0–2.0)
Acid-base deficit: 9 mmol/L — ABNORMAL HIGH (ref 0.0–2.0)
Acid-base deficit: 3 mmol/L — ABNORMAL HIGH (ref 0.0–2.0)
Bicarbonate: 21.1 mmol/L (ref 20.0–28.0)
Bicarbonate: 18 mmol/L — ABNORMAL LOW (ref 20.0–28.0)
Bicarbonate: 17.9 mmol/L — ABNORMAL LOW (ref 20.0–28.0)
Bicarbonate: 18.5 mmol/L — ABNORMAL LOW (ref 20.0–28.0)
Bicarbonate: 22.7 mmol/L (ref 20.0–28.0)
Calcium, Ion: 1.14 mmol/L — ABNORMAL LOW (ref 1.15–1.40)
Calcium, Ion: 1.06 mmol/L — ABNORMAL LOW (ref 1.15–1.40)
Calcium, Ion: 0.71 mmol/L — CL (ref 1.15–1.40)
Calcium, Ion: 0.73 mmol/L — CL (ref 1.15–1.40)
Calcium, Ion: 1.03 mmol/L — ABNORMAL LOW (ref 1.15–1.40)
HCT: 23 % — ABNORMAL LOW (ref 39.0–52.0)
HCT: 23 % — ABNORMAL LOW (ref 39.0–52.0)
HCT: 18 % — ABNORMAL LOW (ref 39.0–52.0)
HCT: 22 % — ABNORMAL LOW (ref 39.0–52.0)
HCT: 20 % — ABNORMAL LOW (ref 39.0–52.0)
Hemoglobin: 7.8 g/dL — ABNORMAL LOW (ref 13.0–17.0)
Hemoglobin: 7.8 g/dL — ABNORMAL LOW (ref 13.0–17.0)
Hemoglobin: 6.1 g/dL — CL (ref 13.0–17.0)
Hemoglobin: 7.5 g/dL — ABNORMAL LOW (ref 13.0–17.0)
Hemoglobin: 6.8 g/dL — CL (ref 13.0–17.0)
O2 Saturation: 100 %
O2 Saturation: 100 %
O2 Saturation: 100 %
O2 Saturation: 100 %
O2 Saturation: 100 %
Patient temperature: 34
Patient temperature: 34
Potassium: 3.5 mmol/L (ref 3.5–5.1)
Potassium: 4.1 mmol/L (ref 3.5–5.1)
Potassium: 4 mmol/L (ref 3.5–5.1)
Potassium: 4.5 mmol/L (ref 3.5–5.1)
Potassium: 4.5 mmol/L (ref 3.5–5.1)
Sodium: 144 mmol/L (ref 135–145)
Sodium: 143 mmol/L (ref 135–145)
Sodium: 143 mmol/L (ref 135–145)
Sodium: 144 mmol/L (ref 135–145)
Sodium: 145 mmol/L (ref 135–145)
TCO2: 23 mmol/L (ref 22–32)
TCO2: 19 mmol/L — ABNORMAL LOW (ref 22–32)
TCO2: 19 mmol/L — ABNORMAL LOW (ref 22–32)
TCO2: 20 mmol/L — ABNORMAL LOW (ref 22–32)
TCO2: 24 mmol/L (ref 22–32)
pCO2 arterial: 42.4 mmHg (ref 32–48)
pCO2 arterial: 35.5 mmHg (ref 32–48)
pCO2 arterial: 43 mmHg (ref 32–48)
pCO2 arterial: 44.7 mmHg (ref 32–48)
pCO2 arterial: 43.6 mmHg (ref 32–48)
pH, Arterial: 7.29 — ABNORMAL LOW (ref 7.35–7.45)
pH, Arterial: 7.297 — ABNORMAL LOW (ref 7.35–7.45)
pH, Arterial: 7.225 — ABNORMAL LOW (ref 7.35–7.45)
pH, Arterial: 7.229 — ABNORMAL LOW (ref 7.35–7.45)
pH, Arterial: 7.325 — ABNORMAL LOW (ref 7.35–7.45)
pO2, Arterial: 536 mmHg — ABNORMAL HIGH (ref 83–108)
pO2, Arterial: 446 mmHg — ABNORMAL HIGH (ref 83–108)
pO2, Arterial: 231 mmHg — ABNORMAL HIGH (ref 83–108)
pO2, Arterial: 451 mmHg — ABNORMAL HIGH (ref 83–108)
pO2, Arterial: 223 mmHg — ABNORMAL HIGH (ref 83–108)

## 2022-06-22 LAB — HIV ANTIBODY (ROUTINE TESTING W REFLEX): HIV Screen 4th Generation wRfx: NONREACTIVE

## 2022-06-22 LAB — CBC
HCT: 37.2 % — ABNORMAL LOW (ref 39.0–52.0)
HCT: 24.8 % — ABNORMAL LOW (ref 39.0–52.0)
HCT: 28.7 % — ABNORMAL LOW (ref 39.0–52.0)
Hemoglobin: 12.5 g/dL — ABNORMAL LOW (ref 13.0–17.0)
Hemoglobin: 8.3 g/dL — ABNORMAL LOW (ref 13.0–17.0)
Hemoglobin: 10.1 g/dL — ABNORMAL LOW (ref 13.0–17.0)
MCH: 31.3 pg (ref 26.0–34.0)
MCH: 30.9 pg (ref 26.0–34.0)
MCH: 30.1 pg (ref 26.0–34.0)
MCHC: 33.6 g/dL (ref 30.0–36.0)
MCHC: 33.5 g/dL (ref 30.0–36.0)
MCHC: 35.2 g/dL (ref 30.0–36.0)
MCV: 93.2 fL (ref 80.0–100.0)
MCV: 92.2 fL (ref 80.0–100.0)
MCV: 85.7 fL (ref 80.0–100.0)
Platelets: 283 10*3/uL (ref 150–400)
Platelets: 165 10*3/uL (ref 150–400)
Platelets: 85 10*3/uL — ABNORMAL LOW (ref 150–400)
RBC: 3.99 MIL/uL — ABNORMAL LOW (ref 4.22–5.81)
RBC: 2.69 MIL/uL — ABNORMAL LOW (ref 4.22–5.81)
RBC: 3.35 MIL/uL — ABNORMAL LOW (ref 4.22–5.81)
RDW: 12.6 % (ref 11.5–15.5)
RDW: 13 % (ref 11.5–15.5)
RDW: 14.9 % (ref 11.5–15.5)
WBC: 9.5 10*3/uL (ref 4.0–10.5)
WBC: 12 10*3/uL — ABNORMAL HIGH (ref 4.0–10.5)
WBC: 7.4 10*3/uL (ref 4.0–10.5)
nRBC: 0 % (ref 0.0–0.2)
nRBC: 0.2 % (ref 0.0–0.2)
nRBC: 0 % (ref 0.0–0.2)

## 2022-06-22 LAB — GLUCOSE, CAPILLARY
Glucose-Capillary: 81 mg/dL (ref 70–99)
Glucose-Capillary: 61 mg/dL — ABNORMAL LOW (ref 70–99)
Glucose-Capillary: 111 mg/dL — ABNORMAL HIGH (ref 70–99)
Glucose-Capillary: 93 mg/dL (ref 70–99)

## 2022-06-22 LAB — DIC (DISSEMINATED INTRAVASCULAR COAGULATION)PANEL
D-Dimer, Quant: 4.23 ug/mL-FEU — ABNORMAL HIGH (ref 0.00–0.50)
Fibrinogen: 192 mg/dL — ABNORMAL LOW (ref 210–475)
INR: 1.4 — ABNORMAL HIGH (ref 0.8–1.2)
Platelets: 76 10*3/uL — ABNORMAL LOW (ref 150–400)
Prothrombin Time: 16.9 seconds — ABNORMAL HIGH (ref 11.4–15.2)
Smear Review: NONE SEEN
aPTT: 33 seconds (ref 24–36)

## 2022-06-22 LAB — SAMPLE TO BLOOD BANK

## 2022-06-22 LAB — HEMOGLOBIN AND HEMATOCRIT, BLOOD
HCT: 27.3 % — ABNORMAL LOW (ref 39.0–52.0)
Hemoglobin: 9.5 g/dL — ABNORMAL LOW (ref 13.0–17.0)

## 2022-06-22 LAB — MASSIVE TRANSFUSION PROTOCOL ORDER (BLOOD BANK NOTIFICATION)

## 2022-06-22 LAB — PREPARE RBC (CROSSMATCH)

## 2022-06-22 LAB — PROTIME-INR
INR: 1.2 (ref 0.8–1.2)
INR: 1.9 — ABNORMAL HIGH (ref 0.8–1.2)
Prothrombin Time: 14.9 seconds (ref 11.4–15.2)
Prothrombin Time: 21.6 seconds — ABNORMAL HIGH (ref 11.4–15.2)

## 2022-06-22 LAB — LACTIC ACID, PLASMA: Lactic Acid, Venous: 6.3 mmol/L (ref 0.5–1.9)

## 2022-06-22 LAB — BLOOD PRODUCT ORDER (VERBAL) VERIFICATION

## 2022-06-22 LAB — MRSA NEXT GEN BY PCR, NASAL: MRSA by PCR Next Gen: NOT DETECTED

## 2022-06-22 LAB — APTT: aPTT: 34 seconds (ref 24–36)

## 2022-06-22 LAB — PHOSPHORUS
Phosphorus: 3.8 mg/dL (ref 2.5–4.6)
Phosphorus: 3.5 mg/dL (ref 2.5–4.6)

## 2022-06-22 LAB — MAGNESIUM
Magnesium: 1.5 mg/dL — ABNORMAL LOW (ref 1.7–2.4)
Magnesium: 2.5 mg/dL — ABNORMAL HIGH (ref 1.7–2.4)

## 2022-06-22 LAB — ABO/RH: ABO/RH(D): AB POS

## 2022-06-22 LAB — ETHANOL: Alcohol, Ethyl (B): 245 mg/dL — ABNORMAL HIGH (ref <10)

## 2022-06-22 SURGERY — WOUND EXPLORATION
Anesthesia: General | Site: Hand | Laterality: Left

## 2022-06-22 SURGERY — LAPAROTOMY, EXPLORATORY
Anesthesia: General

## 2022-06-22 MED ORDER — METHOCARBAMOL 500 MG PO TABS
1000.0000 mg | ORAL_TABLET | Freq: Three times a day (TID) | ORAL | Status: DC
  Administered 2022-06-22 – 2022-07-06 (×41): 1000 mg
  Filled 2022-06-22 (×42): qty 2

## 2022-06-22 MED ORDER — FENTANYL 2500MCG IN NS 250ML (10MCG/ML) PREMIX INFUSION
0.0000 ug/h | INTRAVENOUS | Status: DC
  Administered 2022-06-22: 250 ug/h via INTRAVENOUS
  Administered 2022-06-22: 100 ug/h via INTRAVENOUS
  Administered 2022-06-23: 225 ug/h via INTRAVENOUS
  Administered 2022-06-23: 125 ug/h via INTRAVENOUS
  Administered 2022-06-24: 75 ug/h via INTRAVENOUS
  Administered 2022-06-25: 200 ug/h via INTRAVENOUS
  Filled 2022-06-22 (×7): qty 250

## 2022-06-22 MED ORDER — SODIUM BICARBONATE 8.4 % IV SOLN
INTRAVENOUS | Status: AC
  Filled 2022-06-22: qty 50

## 2022-06-22 MED ORDER — PHENYLEPHRINE HCL-NACL 20-0.9 MG/250ML-% IV SOLN
INTRAVENOUS | Status: AC
  Filled 2022-06-22: qty 250

## 2022-06-22 MED ORDER — PHENYLEPHRINE HCL (PRESSORS) 10 MG/ML IV SOLN: INTRAVENOUS | Status: DC | PRN

## 2022-06-22 MED ORDER — FENTANYL CITRATE (PF) 100 MCG/2ML IJ SOLN
INTRAMUSCULAR | Status: DC | PRN
  Administered 2022-06-22: 100 ug via INTRAVENOUS
  Administered 2022-06-22 (×3): 50 ug via INTRAVENOUS

## 2022-06-22 MED ORDER — PROPOFOL 10 MG/ML IV BOLUS
INTRAVENOUS | Status: AC
  Filled 2022-06-22: qty 20

## 2022-06-22 MED ORDER — SUCCINYLCHOLINE CHLORIDE 200 MG/10ML IV SOSY
PREFILLED_SYRINGE | INTRAVENOUS | Status: AC
  Filled 2022-06-22: qty 10

## 2022-06-22 MED ORDER — CALCIUM CHLORIDE 10 % IV SOLN
INTRAVENOUS | Status: DC | PRN
  Administered 2022-06-22 (×3): 200 mg via INTRAVENOUS

## 2022-06-22 MED ORDER — ROCURONIUM BROMIDE 50 MG/5ML IV SOLN
INTRAVENOUS | Status: AC | PRN
  Administered 2022-06-22: 100 mg via INTRAVENOUS

## 2022-06-22 MED ORDER — CALCIUM GLUCONATE-NACL 1-0.675 GM/50ML-% IV SOLN
INTRAVENOUS | Status: AC
  Administered 2022-06-22: 1000 mg
  Filled 2022-06-22: qty 50

## 2022-06-22 MED ORDER — FENTANYL CITRATE (PF) 250 MCG/5ML IJ SOLN
INTRAMUSCULAR | Status: AC
  Filled 2022-06-22: qty 5

## 2022-06-22 MED ORDER — PROPOFOL 1000 MG/100ML IV EMUL
INTRAVENOUS | Status: AC
  Filled 2022-06-22: qty 100

## 2022-06-22 MED ORDER — LIDOCAINE 2% (20 MG/ML) 5 ML SYRINGE
INTRAMUSCULAR | Status: AC
  Filled 2022-06-22: qty 5

## 2022-06-22 MED ORDER — ALBUMIN HUMAN 5 % IV SOLN: INTRAVENOUS | Status: DC | PRN

## 2022-06-22 MED ORDER — LACTATED RINGERS IV SOLN: INTRAVENOUS | Status: DC | PRN

## 2022-06-22 MED ORDER — IOHEXOL 350 MG/ML SOLN: 75.0000 mL | Freq: Once | INTRAVENOUS | Status: DC | PRN

## 2022-06-22 MED ORDER — ENOXAPARIN SODIUM 30 MG/0.3ML IJ SOSY
30.0000 mg | PREFILLED_SYRINGE | Freq: Two times a day (BID) | INTRAMUSCULAR | Status: DC
  Administered 2022-06-24 – 2022-07-10 (×33): 30 mg via SUBCUTANEOUS
  Filled 2022-06-22 (×32): qty 0.3

## 2022-06-22 MED ORDER — VITAL 1.5 CAL PO LIQD
1000.0000 mL | ORAL | Status: AC
  Administered 2022-06-22 – 2022-06-25 (×3): 1000 mL
  Filled 2022-06-22 (×2): qty 1000

## 2022-06-22 MED ORDER — OXYCODONE HCL 5 MG/5ML PO SOLN
5.0000 mg | ORAL | Status: DC | PRN
  Administered 2022-06-22: 10 mg
  Administered 2022-06-23: 5 mg
  Administered 2022-06-23 – 2022-07-05 (×31): 10 mg
  Administered 2022-07-06: 5 mg
  Filled 2022-06-22 (×22): qty 10
  Filled 2022-06-22: qty 5
  Filled 2022-06-22 (×12): qty 10

## 2022-06-22 MED ORDER — VASOPRESSIN 20 UNIT/ML IV SOLN
INTRAVENOUS | Status: DC | PRN
  Administered 2022-06-22: 4 [IU] via INTRAVENOUS
  Administered 2022-06-22 (×3): 2 [IU] via INTRAVENOUS

## 2022-06-22 MED ORDER — ORAL CARE MOUTH RINSE
15.0000 mL | OROMUCOSAL | Status: DC
  Administered 2022-06-22: 15 mL via OROMUCOSAL

## 2022-06-22 MED ORDER — PANTOPRAZOLE SODIUM 40 MG IV SOLR
40.0000 mg | Freq: Every day | INTRAVENOUS | Status: DC
  Administered 2022-06-22 – 2022-06-23 (×2): 40 mg via INTRAVENOUS
  Filled 2022-06-22 (×2): qty 10

## 2022-06-22 MED ORDER — PROPOFOL 1000 MG/100ML IV EMUL
INTRAVENOUS | Status: AC | PRN
  Administered 2022-06-22: 20 ug/kg/min via INTRAVENOUS

## 2022-06-22 MED ORDER — MIDAZOLAM HCL 2 MG/2ML IJ SOLN
INTRAMUSCULAR | Status: AC
  Filled 2022-06-22: qty 2

## 2022-06-22 MED ORDER — METRONIDAZOLE 500 MG/100ML IV SOLN
500.0000 mg | Freq: Two times a day (BID) | INTRAVENOUS | Status: DC
  Administered 2022-06-22: 500 mg via INTRAVENOUS
  Filled 2022-06-22: qty 100

## 2022-06-22 MED ORDER — DOCUSATE SODIUM 50 MG/5ML PO LIQD
100.0000 mg | Freq: Two times a day (BID) | ORAL | Status: DC
  Administered 2022-06-22 – 2022-06-30 (×13): 100 mg
  Filled 2022-06-22 (×14): qty 10

## 2022-06-22 MED ORDER — VASOPRESSIN 20 UNIT/ML IV SOLN
INTRAVENOUS | Status: AC
  Filled 2022-06-22: qty 1

## 2022-06-22 MED ORDER — NOREPINEPHRINE 4 MG/250ML-% IV SOLN
0.0000 ug/min | INTRAVENOUS | Status: DC
  Administered 2022-06-22: 4 ug/min via INTRAVENOUS

## 2022-06-22 MED ORDER — HEPARIN 6000 UNIT IRRIGATION SOLUTION
Status: AC
  Filled 2022-06-22: qty 500

## 2022-06-22 MED ORDER — TRANEXAMIC ACID-NACL 1000-0.7 MG/100ML-% IV SOLN
INTRAVENOUS | Status: AC
  Administered 2022-06-22: 1000 mg via INTRAVENOUS
  Filled 2022-06-22: qty 100

## 2022-06-22 MED ORDER — SODIUM BICARBONATE 8.4 % IV SOLN
INTRAVENOUS | Status: DC | PRN
  Administered 2022-06-22 (×2): 50 meq via INTRAVENOUS

## 2022-06-22 MED ORDER — LACTATED RINGERS IV SOLN
INTRAVENOUS | Status: AC | PRN
  Administered 2022-06-22 (×2): 1000 mL via INTRAVENOUS

## 2022-06-22 MED ORDER — ORAL CARE MOUTH RINSE: 15.0000 mL | OROMUCOSAL | Status: DC | PRN

## 2022-06-22 MED ORDER — SODIUM CHLORIDE 0.9% IV SOLUTION: Freq: Once | INTRAVENOUS | Status: DC

## 2022-06-22 MED ORDER — ROCURONIUM BROMIDE 10 MG/ML (PF) SYRINGE
PREFILLED_SYRINGE | INTRAVENOUS | Status: AC
  Filled 2022-06-22: qty 10

## 2022-06-22 MED ORDER — FENTANYL CITRATE PF 50 MCG/ML IJ SOSY: 50.0000 ug | PREFILLED_SYRINGE | Freq: Once | INTRAMUSCULAR | Status: DC

## 2022-06-22 MED ORDER — PROPOFOL 500 MG/50ML IV EMUL
INTRAVENOUS | Status: DC | PRN
  Administered 2022-06-22: 50 ug/kg/min via INTRAVENOUS

## 2022-06-22 MED ORDER — FENTANYL BOLUS VIA INFUSION
50.0000 ug | INTRAVENOUS | Status: DC | PRN
  Administered 2022-06-22 – 2022-06-25 (×4): 100 ug via INTRAVENOUS

## 2022-06-22 MED ORDER — CALCIUM GLUCONATE-NACL 1-0.675 GM/50ML-% IV SOLN: 1.0000 g | Freq: Once | INTRAVENOUS | Status: DC

## 2022-06-22 MED ORDER — ROCURONIUM BROMIDE 100 MG/10ML IV SOLN
INTRAVENOUS | Status: DC | PRN
  Administered 2022-06-22: 100 mg via INTRAVENOUS

## 2022-06-22 MED ORDER — POLYETHYLENE GLYCOL 3350 17 G PO PACK
17.0000 g | PACK | Freq: Every day | ORAL | Status: DC
  Administered 2022-06-22 – 2022-06-30 (×6): 17 g
  Filled 2022-06-22 (×6): qty 1

## 2022-06-22 MED ORDER — ETOMIDATE 2 MG/ML IV SOLN
INTRAVENOUS | Status: AC | PRN
  Administered 2022-06-22: 30 mg via INTRAVENOUS

## 2022-06-22 MED ORDER — CALCIUM CHLORIDE 10 % IV SOLN
INTRAVENOUS | Status: DC | PRN
  Administered 2022-06-22: 100 mg via INTRAVENOUS
  Administered 2022-06-22 (×2): 200 mg via INTRAVENOUS
  Administered 2022-06-22: 100 mg via INTRAVENOUS
  Administered 2022-06-22 (×2): 200 mg via INTRAVENOUS

## 2022-06-22 MED ORDER — ACETAMINOPHEN 500 MG PO TABS
1000.0000 mg | ORAL_TABLET | Freq: Four times a day (QID) | ORAL | Status: DC
  Administered 2022-06-22 – 2022-07-06 (×50): 1000 mg
  Filled 2022-06-22 (×52): qty 2

## 2022-06-22 MED ORDER — MAGNESIUM SULFATE 4 GM/100ML IV SOLN
4.0000 g | Freq: Once | INTRAVENOUS | Status: AC
  Administered 2022-06-22: 4 g via INTRAVENOUS
  Filled 2022-06-22: qty 100

## 2022-06-22 MED ORDER — ORAL CARE MOUTH RINSE
15.0000 mL | OROMUCOSAL | Status: DC
  Administered 2022-06-22 – 2022-06-30 (×98): 15 mL via OROMUCOSAL

## 2022-06-22 MED ORDER — HEMOSTATIC AGENTS (NO CHARGE) OPTIME
TOPICAL | Status: DC | PRN
  Administered 2022-06-22: 2 via TOPICAL

## 2022-06-22 MED ORDER — CHLORHEXIDINE GLUCONATE CLOTH 2 % EX PADS
6.0000 | MEDICATED_PAD | Freq: Every day | CUTANEOUS | Status: DC
  Administered 2022-06-23 – 2022-06-24 (×4): 6 via TOPICAL

## 2022-06-22 MED ORDER — CEFAZOLIN SODIUM-DEXTROSE 2-4 GM/100ML-% IV SOLN
2.0000 g | Freq: Once | INTRAVENOUS | Status: AC
  Administered 2022-06-22: 2 g via INTRAVENOUS
  Filled 2022-06-22: qty 100

## 2022-06-22 MED ORDER — NOREPINEPHRINE 4 MG/250ML-% IV SOLN
INTRAVENOUS | Status: DC | PRN
  Administered 2022-06-22: 4 ug/min via INTRAVENOUS

## 2022-06-22 MED ORDER — MIDAZOLAM HCL 5 MG/5ML IJ SOLN
INTRAMUSCULAR | Status: DC | PRN
  Administered 2022-06-22: 2 mg via INTRAVENOUS

## 2022-06-22 MED ORDER — METRONIDAZOLE 500 MG/100ML IV SOLN
500.0000 mg | Freq: Two times a day (BID) | INTRAVENOUS | Status: AC
  Administered 2022-06-22: 500 mg via INTRAVENOUS
  Filled 2022-06-22: qty 100

## 2022-06-22 MED ORDER — POTASSIUM CHLORIDE IN NACL 20-0.9 MEQ/L-% IV SOLN
INTRAVENOUS | Status: DC
  Filled 2022-06-22: qty 1000

## 2022-06-22 MED ORDER — DEXTROSE 50 % IV SOLN
INTRAVENOUS | Status: AC
  Administered 2022-06-22: 12.5 g via INTRAVENOUS
  Filled 2022-06-22: qty 50

## 2022-06-22 MED ORDER — ONDANSETRON HCL 4 MG/2ML IJ SOLN
INTRAMUSCULAR | Status: AC
  Filled 2022-06-22: qty 2

## 2022-06-22 MED ORDER — IOHEXOL 350 MG/ML SOLN
100.0000 mL | Freq: Once | INTRAVENOUS | Status: AC | PRN
  Administered 2022-06-22: 100 mL via INTRAVENOUS

## 2022-06-22 MED ORDER — TETANUS-DIPHTH-ACELL PERTUSSIS 5-2.5-18.5 LF-MCG/0.5 IM SUSY
0.5000 mL | PREFILLED_SYRINGE | Freq: Once | INTRAMUSCULAR | Status: AC
  Administered 2022-06-22: 0.5 mL via INTRAMUSCULAR
  Filled 2022-06-22: qty 0.5

## 2022-06-22 MED ORDER — PROPOFOL 1000 MG/100ML IV EMUL
5.0000 ug/kg/min | INTRAVENOUS | Status: DC
  Administered 2022-06-22: 60 ug/kg/min via INTRAVENOUS
  Administered 2022-06-22: 50 ug/kg/min via INTRAVENOUS
  Administered 2022-06-22: 60 ug/kg/min via INTRAVENOUS
  Administered 2022-06-22 (×2): 70 ug/kg/min via INTRAVENOUS
  Administered 2022-06-23: 30 ug/kg/min via INTRAVENOUS
  Administered 2022-06-23: 20 ug/kg/min via INTRAVENOUS
  Administered 2022-06-23: 35 ug/kg/min via INTRAVENOUS
  Administered 2022-06-23: 50 ug/kg/min via INTRAVENOUS
  Administered 2022-06-23 (×2): 20 ug/kg/min via INTRAVENOUS
  Administered 2022-06-24: 10 ug/kg/min via INTRAVENOUS
  Administered 2022-06-24: 30 ug/kg/min via INTRAVENOUS
  Administered 2022-06-24: 20 ug/kg/min via INTRAVENOUS
  Administered 2022-06-25: 25 ug/kg/min via INTRAVENOUS
  Filled 2022-06-22 (×15): qty 100

## 2022-06-22 MED ORDER — 0.9 % SODIUM CHLORIDE (POUR BTL) OPTIME
TOPICAL | Status: DC | PRN
  Administered 2022-06-22: 1000 mL

## 2022-06-22 MED ORDER — PROSOURCE TF20 ENFIT COMPATIBL EN LIQD
60.0000 mL | Freq: Two times a day (BID) | ENTERAL | Status: DC
  Administered 2022-06-22 – 2022-07-06 (×29): 60 mL
  Filled 2022-06-22 (×29): qty 60

## 2022-06-22 MED ORDER — ROCURONIUM BROMIDE 100 MG/10ML IV SOLN
INTRAVENOUS | Status: DC | PRN
  Administered 2022-06-22: 50 mg via INTRAVENOUS
  Administered 2022-06-22: 100 mg via INTRAVENOUS

## 2022-06-22 MED ORDER — 0.9 % SODIUM CHLORIDE (POUR BTL) OPTIME
TOPICAL | Status: DC | PRN
  Administered 2022-06-22 (×4): 1000 mL

## 2022-06-22 MED ORDER — SODIUM CHLORIDE 0.9 % IV SOLN: INTRAVENOUS | Status: DC | PRN

## 2022-06-22 MED ORDER — CEFAZOLIN SODIUM-DEXTROSE 2-3 GM-%(50ML) IV SOLR
INTRAVENOUS | Status: DC | PRN
  Administered 2022-06-22: 2 g via INTRAVENOUS

## 2022-06-22 MED ORDER — TRANEXAMIC ACID-NACL 1000-0.7 MG/100ML-% IV SOLN: 1000.0000 mg | INTRAVENOUS | Status: AC

## 2022-06-22 MED ORDER — PANTOPRAZOLE SODIUM 40 MG PO TBEC: 40.0000 mg | DELAYED_RELEASE_TABLET | Freq: Every day | ORAL | Status: DC

## 2022-06-22 MED ORDER — PHENYLEPHRINE HCL-NACL 20-0.9 MG/250ML-% IV SOLN
0.0000 ug/min | INTRAVENOUS | Status: DC
  Administered 2022-06-22: 80 ug/min via INTRAVENOUS

## 2022-06-22 MED ORDER — DEXTROSE 50 % IV SOLN: 12.5000 g | INTRAVENOUS | Status: AC

## 2022-06-22 MED ORDER — PHENYLEPHRINE HCL-NACL 20-0.9 MG/250ML-% IV SOLN
INTRAVENOUS | Status: DC | PRN
  Administered 2022-06-22: 60 ug/min via INTRAVENOUS

## 2022-06-22 SURGICAL SUPPLY — 42 items
ADH SKN CLS APL DERMABOND .7 (GAUZE/BANDAGES/DRESSINGS) ×3
BAG COUNTER SPONGE SURGICOUNT (BAG) ×3 IMPLANT
BAG SPNG CNTER NS LX DISP (BAG) ×3
BIOPATCH BLUE 3/4IN DISK W/1.5 (GAUZE/BANDAGES/DRESSINGS) ×1 IMPLANT
BNDG ELASTIC 4X5.8 VLCR STR LF (GAUZE/BANDAGES/DRESSINGS) ×1 IMPLANT
BNDG GAUZE DERMACEA FLUFF 4 (GAUZE/BANDAGES/DRESSINGS) ×1 IMPLANT
BNDG GZE DERMACEA 4 6PLY (GAUZE/BANDAGES/DRESSINGS) ×3
CANISTER SUCT 3000ML PPV (MISCELLANEOUS) ×3 IMPLANT
CLIP LIGATING EXTRA MED SLVR (CLIP) ×1 IMPLANT
CLIP LIGATING EXTRA SM BLUE (MISCELLANEOUS) ×1 IMPLANT
COVER SURGICAL LIGHT HANDLE (MISCELLANEOUS) ×3 IMPLANT
DERMABOND ADVANCED .7 DNX12 (GAUZE/BANDAGES/DRESSINGS) ×1 IMPLANT
DRSG COVADERM 4X10 (GAUZE/BANDAGES/DRESSINGS) ×1 IMPLANT
DRSG XEROFORM 1X8 (GAUZE/BANDAGES/DRESSINGS) ×1 IMPLANT
ELECT BLADE 4.0 EZ CLEAN MEGAD (MISCELLANEOUS) ×3
ELECT REM PT RETURN 9FT ADLT (ELECTROSURGICAL) ×3
ELECTRODE BLDE 4.0 EZ CLN MEGD (MISCELLANEOUS) ×1 IMPLANT
ELECTRODE REM PT RTRN 9FT ADLT (ELECTROSURGICAL) ×3 IMPLANT
GLOVE BIO SURGEON STRL SZ 6 (GLOVE) ×1 IMPLANT
GLOVE BIO SURGEON STRL SZ7.5 (GLOVE) ×3 IMPLANT
GLOVE BIOGEL PI IND STRL 6 (GLOVE) ×1 IMPLANT
GLOVE BIOGEL PI IND STRL 8 (GLOVE) ×3 IMPLANT
GLOVE SRG 8 PF TXTR STRL LF DI (GLOVE) ×3 IMPLANT
GLOVE SURG POLYISO LF SZ8 (GLOVE) IMPLANT
GLOVE SURG UNDER POLY LF SZ8 (GLOVE) ×3
GOWN STRL REUS W/ TWL LRG LVL3 (GOWN DISPOSABLE) ×9 IMPLANT
GOWN STRL REUS W/TWL 2XL LVL3 (GOWN DISPOSABLE) ×6 IMPLANT
GOWN STRL REUS W/TWL LRG LVL3 (GOWN DISPOSABLE) ×9
HEMOSTAT SNOW SURGICEL 2X4 (HEMOSTASIS) ×2 IMPLANT
KIT BASIN OR (CUSTOM PROCEDURE TRAY) ×3 IMPLANT
KIT TURNOVER KIT B (KITS) ×3 IMPLANT
NS IRRIG 1000ML POUR BTL (IV SOLUTION) ×6 IMPLANT
PACK GENERAL/GYN (CUSTOM PROCEDURE TRAY) ×3 IMPLANT
PAD ARMBOARD 7.5X6 YLW CONV (MISCELLANEOUS) ×6 IMPLANT
SPONGE T-LAP 18X18 ~~LOC~~+RFID (SPONGE) ×1 IMPLANT
STAPLER VISISTAT 35W (STAPLE) ×1 IMPLANT
SUT PROLENE 5 0 C 1 24 (SUTURE) ×1 IMPLANT
SUT PROLENE 6 0 CC (SUTURE) ×2 IMPLANT
SUT VIC AB 2-0 CT1 27 (SUTURE) ×15
SUT VIC AB 2-0 CT1 TAPERPNT 27 (SUTURE) ×5 IMPLANT
TOWEL GREEN STERILE (TOWEL DISPOSABLE) ×3 IMPLANT
WATER STERILE IRR 1000ML POUR (IV SOLUTION) ×6 IMPLANT

## 2022-06-22 SURGICAL SUPPLY — 42 items
BAG COUNTER SPONGE SURGICOUNT (BAG) ×1 IMPLANT
BAG SPNG CNTER NS LX DISP (BAG) ×1
BIOPATCH RED 1 DISK 7.0 (GAUZE/BANDAGES/DRESSINGS) IMPLANT
BLADE CLIPPER SURG (BLADE) IMPLANT
BNDG GAUZE DERMACEA FLUFF 4 (GAUZE/BANDAGES/DRESSINGS) IMPLANT
BNDG GZE DERMACEA 4 6PLY (GAUZE/BANDAGES/DRESSINGS) ×1
COVER SURGICAL LIGHT HANDLE (MISCELLANEOUS) ×1 IMPLANT
DRAPE LAPAROSCOPIC ABDOMINAL (DRAPES) ×1 IMPLANT
DRAPE WARM FLUID 44X44 (DRAPES) ×1 IMPLANT
ELECT CAUTERY BLADE 6.4 (BLADE) ×1 IMPLANT
ELECT REM PT RETURN 9FT ADLT (ELECTROSURGICAL) ×1
ELECTRODE REM PT RTRN 9FT ADLT (ELECTROSURGICAL) ×1 IMPLANT
GAUZE PAD ABD 8X10 STRL (GAUZE/BANDAGES/DRESSINGS) IMPLANT
GAUZE SPONGE 4X4 12PLY STRL (GAUZE/BANDAGES/DRESSINGS) IMPLANT
GAUZE SPONGE 4X4 12PLY STRL LF (GAUZE/BANDAGES/DRESSINGS) IMPLANT
GLOVE BIOGEL M STRL SZ7.5 (GLOVE) ×1 IMPLANT
GLOVE INDICATOR 8.0 STRL GRN (GLOVE) ×2 IMPLANT
GOWN STRL REUS W/ TWL LRG LVL3 (GOWN DISPOSABLE) ×1 IMPLANT
GOWN STRL REUS W/TWL 2XL LVL3 (GOWN DISPOSABLE) ×1 IMPLANT
GOWN STRL REUS W/TWL LRG LVL3 (GOWN DISPOSABLE) ×1
HANDLE SUCTION POOLE (INSTRUMENTS) ×1 IMPLANT
KIT BASIN OR (CUSTOM PROCEDURE TRAY) ×1 IMPLANT
KIT TURNOVER KIT B (KITS) ×1 IMPLANT
LIGASURE IMPACT 36 18CM CVD LR (INSTRUMENTS) IMPLANT
NS IRRIG 1000ML POUR BTL (IV SOLUTION) ×2 IMPLANT
PACK GENERAL/GYN (CUSTOM PROCEDURE TRAY) ×1 IMPLANT
PAD ARMBOARD 7.5X6 YLW CONV (MISCELLANEOUS) ×1 IMPLANT
PENCIL SMOKE EVACUATOR (MISCELLANEOUS) ×1 IMPLANT
SPONGE T-LAP 18X18 ~~LOC~~+RFID (SPONGE) IMPLANT
STAPLER VISISTAT 35W (STAPLE) ×1 IMPLANT
SUCTION POOLE HANDLE (INSTRUMENTS) ×1
SUT ETHILON 2 0 FS 18 (SUTURE) IMPLANT
SUT PDS AB 1 TP1 96 (SUTURE) ×2 IMPLANT
SUT SILK 2 0 SH CR/8 (SUTURE) ×1 IMPLANT
SUT SILK 2 0 TIES 10X30 (SUTURE) ×1 IMPLANT
SUT SILK 3 0 SH CR/8 (SUTURE) ×1 IMPLANT
SUT SILK 3 0 TIES 10X30 (SUTURE) ×1 IMPLANT
SYSTEM SAHARA CHEST DRAIN ATS (WOUND CARE) IMPLANT
TOWEL GREEN STERILE (TOWEL DISPOSABLE) ×1 IMPLANT
TRAY FOLEY MTR SLVR 16FR STAT (SET/KITS/TRAYS/PACK) IMPLANT
TRAY WAYNE PNEUMOTHORAX 14X18 (TRAY / TRAY PROCEDURE) IMPLANT
YANKAUER SUCT BULB TIP NO VENT (SUCTIONS) IMPLANT
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
## 2022-06-22 NOTE — ED Notes (Signed)
Moist gauze placed over exposed bowel

## 2022-06-22 NOTE — Progress Notes (Signed)
Patient transported to OR 16 without incidence. Vent outside room.

## 2022-06-22 NOTE — Progress Notes (Signed)
Patient transported from CT to 4N24 without incidence.
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
## 2022-06-22 NOTE — ED Notes (Signed)
Preparing for intubation

## 2022-06-22 NOTE — Transfer of Care (Signed)
Immediate Anesthesia Transfer of Care Note  Patient: Joseph [name]  Procedure(s) Performed: Left chest exploration and intercostal artery ligation (Left: Chest) HEMOTHORAX EVACUATION (Left: Chest) Repair left hand laceration (Hand)  Patient Location: ICU  Anesthesia Type:General  Level of Consciousness: Patient remains intubated per anesthesia plan  Airway & Oxygen Therapy: Patient remains intubated per anesthesia plan and Patient placed on Ventilator (see vital sign flow sheet for setting)  Post-op Assessment: Report given to RN and Post -op Vital signs reviewed and stable  Post vital signs: Reviewed and stable  Last Vitals:  Vitals Value Taken Time  BP 134/92 06/22/22 0814  Temp 35.3 C 06/22/22 0818  Pulse 89 06/22/22 0816  Resp 20 06/22/22 0818  SpO2 92 % 06/22/22 0816  Vitals shown include unvalidated device data.  Last Pain:  Vitals:   06/22/22 0211  PainSc: 10-Worst pain ever         Complications: No notable events documented.
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
## 2022-06-22 NOTE — Anesthesia Preprocedure Evaluation (Signed)
Anesthesia Evaluation  Patient identified by MRN, date of birth, ID band Patient unresponsive  Preop documentation limited or incomplete due to emergent nature of procedure.  Airway Mallampati: Intubated       Dental   Pulmonary     + decreased breath sounds      Cardiovascular  Rhythm:Regular Rate:Tachycardia     Neuro/Psych    GI/Hepatic   Endo/Other    Renal/GU      Musculoskeletal   Abdominal   Peds  Hematology   Anesthesia Other Findings   Reproductive/Obstetrics                             Anesthesia Physical Anesthesia Plan  ASA: 3 and emergent  Anesthesia Plan: General   Post-op Pain Management:    Induction: Intravenous  PONV Risk Score and Plan: Treatment may vary due to age or medical condition  Airway Management Planned:   Additional Equipment: Arterial line and CVP  Intra-op Plan:   Post-operative Plan: Post-operative intubation/ventilation  Informed Consent: I have reviewed the patients History and Physical, chart, labs and discussed the procedure including the risks, benefits and alternatives for the proposed anesthesia with the patient or authorized representative who has indicated his/her understanding and acceptance.       Plan Discussed with: CRNA and Anesthesiologist  Anesthesia Plan Comments:         Anesthesia Quick Evaluation
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
## 2022-06-22 NOTE — ED Notes (Signed)
**  Pt received a total of 13 PRBCs, 8 FFP, 2 PLT and 2 Cryo between 4NICU and OR.  All documented in under MTP tab in flowsheets.

## 2022-06-22 NOTE — Progress Notes (Signed)
Pt received 6 units of RPCs, 3 Ffps, 1  cryo, 1 platelets by the time he left the unit for OR.

## 2022-06-22 NOTE — Progress Notes (Signed)
Orthopedic Tech Progress Note Patient Details:  Joseph [name] 01/02/1996 [id number]  Patient ID: Joseph [name], male   DOB: [birth year], 26 y.o.   MRN: [id number] I attended trauma page. Karolee Stamps 06/22/2022, 1:44 AM
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
## 2022-06-22 NOTE — Anesthesia Preprocedure Evaluation (Addendum)
Anesthesia Evaluation  Patient identified by MRN, date of birth, ID band Patient unresponsive  General Assessment Comment:Emergency case from [place] documentation limited or incomplete due to emergent nature of procedure.  Airway Mallampati: Intubated       Dental   Pulmonary     + decreased breath sounds      Cardiovascular  Rhythm:Regular Rate:Tachycardia     Neuro/Psych    GI/Hepatic   Endo/Other    Renal/GU      Musculoskeletal   Abdominal   Peds  Hematology   Anesthesia Other Findings   Reproductive/Obstetrics                            Anesthesia Physical Anesthesia Plan  ASA: 1 and emergent  Anesthesia Plan: General   Post-op Pain Management:    Induction: Intravenous  PONV Risk Score and Plan: Treatment may vary due to age or medical condition  Airway Management Planned: Oral ETT  Additional Equipment:   Intra-op Plan:   Post-operative Plan: Post-operative intubation/ventilation  Informed Consent:   Plan Discussed with: Anesthesiologist, CRNA and Surgeon  Anesthesia Plan Comments:        Anesthesia Quick Evaluation
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
## 2022-06-22 NOTE — Progress Notes (Signed)
RT NOTE:  Ventilated pt transported to OR without event. Report given to [place], RRT.
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
## 2022-06-22 NOTE — Anesthesia Procedure Notes (Signed)
Arterial Line Insertion Start/End10/05/2022 2:15 AM, 06/22/2022 2:30 AM Performed by: Josephine Igo, CRNA, CRNA  Patient location: OR. Emergency situation Patient sedated Left, radial was placed Catheter size: 20 G Hand hygiene performed  and maximum sterile barriers used   Attempts: 4 Procedure performed without using ultrasound guided technique. Following insertion, dressing applied and Biopatch. Post procedure assessment: normal  Patient tolerated the procedure well with no immediate complications.

## 2022-06-22 NOTE — Transfer of Care (Signed)
Immediate Anesthesia Transfer of Care Note  Patient: Joseph [name]  Procedure(s) Performed: EXPLORATORY LAPAROTOMY PLACEMENT OF LEFT CHEST TUBE. REPAIR OF 7 CENTIMETER RIGHT SCALP LACERATION. REPAIR OF SCALP LACERATION  AT [place].  Patient Location: ICU  Anesthesia Type:General  Level of Consciousness: Patient remains intubated per anesthesia plan  Airway & Oxygen Therapy: Patient remains intubated per anesthesia plan and Patient placed on Ventilator (see vital sign flow sheet for setting)  Post-op Assessment: Report given to RN and Post -op Vital signs reviewed and stable  Post vital signs: Reviewed and stable  Last Vitals:  Vitals Value Taken Time  BP 121/61 06/22/22 0431  Temp    Pulse 86 06/22/22 0434  Resp 20 06/22/22 0439  SpO2 100 % 06/22/22 0434  Vitals shown include unvalidated device data.  Last Pain:  Vitals:   06/22/22 0211  PainSc: 10-Worst pain ever         Complications: No notable events documented.

## 2022-06-22 NOTE — Procedures (Signed)
Cortrak  Person Inserting Tube:  Skarleth Delmonico D, RD Tube Type:  Cortrak - 43 inches Tube Size:  10 Tube Location:  Left nare Secured by: Bridle Technique Used to Measure Tube Placement:  Marking at nare/corner of mouth Cortrak Secured At:  69 cm  Cortrak Tube Team Note:  Consult received to place a Cortrak feeding tube.   X-ray is required, abdominal x-ray has been ordered by the Cortrak team. Please confirm tube placement before using the Cortrak tube.   If the tube becomes dislodged please keep the tube and contact the Cortrak team at [email or web address] for replacement.  If after hours and replacement cannot be delayed, place a NG tube and confirm placement with an abdominal x-ray.    Joseph [name], RD, LDN Clinical Dietitian RD pager # available in AMION  After hours/weekend pager # available in AMION  
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
## 2022-06-22 NOTE — Anesthesia Postprocedure Evaluation (Signed)
Anesthesia Post Note  Patient: Masaichi Kracht  Procedure(s) Performed: Left chest exploration and intercostal artery ligation (Left: Chest) HEMOTHORAX EVACUATION (Left: Chest) Repair left hand laceration (Hand)     Patient location during evaluation: PACU Anesthesia Type: General Level of consciousness: patient remains intubated per anesthesia plan Pain management: pain level not controlled Vital Signs Assessment: post-procedure vital signs reviewed and stable Respiratory status: patient remains intubated per anesthesia plan and patient on ventilator - see flowsheet for VS Cardiovascular status: stable Postop Assessment: no apparent nausea or vomiting Anesthetic complications: no   No notable events documented.  Last Vitals:  Vitals:   06/22/22 0915 06/22/22 0930  BP:    Pulse: 90 89  Resp: 20 20  Temp: (!) 35.7 C (!) 36 C  SpO2: 100% 100%    Last Pain:  Vitals:   06/22/22 0211  PainSc: 10-Worst pain ever                 Huston Foley

## 2022-06-23 ENCOUNTER — Inpatient Hospital Stay (HOSPITAL_COMMUNITY): Payer: Self-pay

## 2022-06-23 ENCOUNTER — Encounter (HOSPITAL_COMMUNITY): Payer: Self-pay | Admitting: General Surgery

## 2022-06-23 LAB — URINALYSIS, ROUTINE W REFLEX MICROSCOPIC
Bacteria, UA: NONE SEEN
Bacteria, UA: NONE SEEN
Bilirubin Urine: NEGATIVE
Bilirubin Urine: NEGATIVE
Glucose, UA: NEGATIVE mg/dL
Glucose, UA: NEGATIVE mg/dL
Ketones, ur: NEGATIVE mg/dL
Ketones, ur: NEGATIVE mg/dL
Leukocytes,Ua: NEGATIVE
Leukocytes,Ua: NEGATIVE
Nitrite: NEGATIVE
Nitrite: NEGATIVE
Protein, ur: NEGATIVE mg/dL
Protein, ur: 100 mg/dL — AB
Specific Gravity, Urine: 1.014 (ref 1.005–1.030)
Specific Gravity, Urine: 1.029 (ref 1.005–1.030)
pH: 5 (ref 5.0–8.0)
pH: 5 (ref 5.0–8.0)

## 2022-06-23 LAB — BPAM FFP
Blood Product Expiration Date: 202310142359
Blood Product Expiration Date: 202310142359
Blood Product Expiration Date: 202310132359
Blood Product Expiration Date: 202310132359
Blood Product Expiration Date: 202310132359
Blood Product Expiration Date: 202310132359
Blood Product Expiration Date: 202310142359
Blood Product Expiration Date: 202310142359
Blood Product Expiration Date: 202310142359
Blood Product Expiration Date: 202310142359
Blood Product Expiration Date: 202310142359
Blood Product Expiration Date: 202310142359
Blood Product Expiration Date: 202310142359
Blood Product Expiration Date: 202310142359
Blood Product Expiration Date: 202310142359
Blood Product Expiration Date: 202310142359
Blood Product Expiration Date: 202310142359
Blood Product Expiration Date: 202310142359
Blood Product Expiration Date: 202310142359
Blood Product Expiration Date: 202310142359
ISSUE DATE / TIME: 202310090359
ISSUE DATE / TIME: 202310090359
ISSUE DATE / TIME: 202310090548
ISSUE DATE / TIME: 202310090548
ISSUE DATE / TIME: 202310090621
ISSUE DATE / TIME: 202310090621
ISSUE DATE / TIME: 202310090621
ISSUE DATE / TIME: 202310090621
ISSUE DATE / TIME: 202310091618
ISSUE DATE / TIME: 202310091628
ISSUE DATE / TIME: 202310091628
ISSUE DATE / TIME: 202310091628
ISSUE DATE / TIME: 202310091628
ISSUE DATE / TIME: 202310091628
ISSUE DATE / TIME: 202310091628
ISSUE DATE / TIME: 202310091640
ISSUE DATE / TIME: 202310091640
ISSUE DATE / TIME: 202310091640
ISSUE DATE / TIME: 202310091640
ISSUE DATE / TIME: 202310091652
Unit Type and Rh: 8400
Unit Type and Rh: 8400
Unit Type and Rh: 2800
Unit Type and Rh: 2800
Unit Type and Rh: 600
Unit Type and Rh: 600
Unit Type and Rh: 6200
Unit Type and Rh: 6200
Unit Type and Rh: 8400
Unit Type and Rh: 8400
Unit Type and Rh: 8400
Unit Type and Rh: 8400
Unit Type and Rh: 8400
Unit Type and Rh: 8400
Unit Type and Rh: 8400
Unit Type and Rh: 8400
Unit Type and Rh: 8400
Unit Type and Rh: 8400
Unit Type and Rh: 8400
Unit Type and Rh: 8400

## 2022-06-23 LAB — PREPARE PLATELET PHERESIS
Unit division: 0
Unit division: 0
Unit division: 0

## 2022-06-23 LAB — PREPARE FRESH FROZEN PLASMA
Unit division: 0
Unit division: 0
Unit division: 0
Unit division: 0
Unit division: 0
Unit division: 0
Unit division: 0
Unit division: 0
Unit division: 0
Unit division: 0
Unit division: 0
Unit division: 0
Unit division: 0
Unit division: 0
Unit division: 0
Unit division: 0
Unit division: 0
Unit division: 0

## 2022-06-23 LAB — BPAM PLATELET PHERESIS
Blood Product Expiration Date: 202310112359
Blood Product Expiration Date: 202310112359
Blood Product Expiration Date: 202310122359
ISSUE DATE / TIME: 202310090523
ISSUE DATE / TIME: 202310090523
ISSUE DATE / TIME: 202310091654
Unit Type and Rh: 5100
Unit Type and Rh: 6200
Unit Type and Rh: 7300

## 2022-06-23 LAB — GLUCOSE, CAPILLARY
Glucose-Capillary: 68 mg/dL — ABNORMAL LOW (ref 70–99)
Glucose-Capillary: 94 mg/dL (ref 70–99)
Glucose-Capillary: 83 mg/dL (ref 70–99)
Glucose-Capillary: 99 mg/dL (ref 70–99)
Glucose-Capillary: 91 mg/dL (ref 70–99)
Glucose-Capillary: 114 mg/dL — ABNORMAL HIGH (ref 70–99)

## 2022-06-23 LAB — PHOSPHORUS
Phosphorus: 2.3 mg/dL — ABNORMAL LOW (ref 2.5–4.6)
Phosphorus: 2.9 mg/dL (ref 2.5–4.6)

## 2022-06-23 LAB — BASIC METABOLIC PANEL
Anion gap: 5 (ref 5–15)
BUN: 9 mg/dL (ref 6–20)
CO2: 27 mmol/L (ref 22–32)
Calcium: 8 mg/dL — ABNORMAL LOW (ref 8.9–10.3)
Chloride: 108 mmol/L (ref 98–111)
Creatinine, Ser: 1.07 mg/dL (ref 0.61–1.24)
GFR, Estimated: >60 mL/min (ref >60)
Glucose, Bld: 112 mg/dL — ABNORMAL HIGH (ref 70–99)
Potassium: 3.7 mmol/L (ref 3.5–5.1)
Sodium: 140 mmol/L (ref 135–145)

## 2022-06-23 LAB — CBC
HCT: 25.7 % — ABNORMAL LOW (ref 39.0–52.0)
Hemoglobin: 8.8 g/dL — ABNORMAL LOW (ref 13.0–17.0)
MCH: 30.1 pg (ref 26.0–34.0)
MCHC: 34.2 g/dL (ref 30.0–36.0)
MCV: 88 fL (ref 80.0–100.0)
Platelets: 81 10*3/uL — ABNORMAL LOW (ref 150–400)
RBC: 2.92 MIL/uL — ABNORMAL LOW (ref 4.22–5.81)
RDW: 15.4 % (ref 11.5–15.5)
WBC: 7.3 10*3/uL (ref 4.0–10.5)
nRBC: 0 % (ref 0.0–0.2)

## 2022-06-23 LAB — CALCIUM, IONIZED: Calcium, Ionized, Serum: 4.9 mg/dL (ref 4.5–5.6)

## 2022-06-23 LAB — TRIGLYCERIDES: Triglycerides: 351 mg/dL — ABNORMAL HIGH (ref <150)

## 2022-06-23 LAB — MAGNESIUM
Magnesium: 2 mg/dL (ref 1.7–2.4)
Magnesium: 2.2 mg/dL (ref 1.7–2.4)

## 2022-06-23 MED ORDER — DEXTROSE 50 % IV SOLN
12.5000 g | Freq: Once | INTRAVENOUS | Status: AC
  Administered 2022-06-23: 12.5 g via INTRAVENOUS

## 2022-06-23 MED ORDER — IBUPROFEN 200 MG PO TABS
800.0000 mg | ORAL_TABLET | Freq: Four times a day (QID) | ORAL | Status: DC | PRN
  Administered 2022-06-23 – 2022-06-25 (×4): 800 mg
  Filled 2022-06-23 (×4): qty 4

## 2022-06-23 MED ORDER — POTASSIUM PHOSPHATES 15 MMOLE/5ML IV SOLN
15.0000 mmol | Freq: Once | INTRAVENOUS | Status: AC
  Administered 2022-06-23: 15 mmol via INTRAVENOUS
  Filled 2022-06-23: qty 5

## 2022-06-23 MED ORDER — SODIUM CHLORIDE 0.9 % IV SOLN
2.0000 g | Freq: Three times a day (TID) | INTRAVENOUS | Status: DC
  Administered 2022-06-23 – 2022-06-29 (×17): 2 g via INTRAVENOUS
  Filled 2022-06-23 (×17): qty 12.5

## 2022-06-23 MED ORDER — FOLIC ACID 1 MG PO TABS
1.0000 mg | ORAL_TABLET | Freq: Every day | ORAL | Status: DC
  Administered 2022-06-23 – 2022-07-06 (×14): 1 mg
  Filled 2022-06-23 (×14): qty 1

## 2022-06-23 MED ORDER — THIAMINE MONONITRATE 100 MG PO TABS
100.0000 mg | ORAL_TABLET | Freq: Every day | ORAL | Status: DC
  Administered 2022-06-23 – 2022-06-30 (×8): 100 mg
  Filled 2022-06-23 (×9): qty 1

## 2022-06-23 MED ORDER — THIAMINE HCL 100 MG/ML IJ SOLN: 100.0000 mg | Freq: Every day | INTRAMUSCULAR | Status: DC

## 2022-06-23 MED ORDER — LORAZEPAM 2 MG/ML IJ SOLN
1.0000 mg | INTRAMUSCULAR | Status: AC | PRN
  Administered 2022-06-23: 1 mg via INTRAVENOUS
  Administered 2022-06-23 – 2022-06-24 (×6): 2 mg via INTRAVENOUS
  Administered 2022-06-24: 1 mg via INTRAVENOUS
  Administered 2022-06-24 – 2022-06-25 (×3): 2 mg via INTRAVENOUS
  Administered 2022-06-25: 4 mg via INTRAVENOUS
  Administered 2022-06-25 (×3): 2 mg via INTRAVENOUS
  Filled 2022-06-23 (×9): qty 1
  Filled 2022-06-23: qty 2
  Filled 2022-06-23 (×5): qty 1

## 2022-06-23 MED ORDER — METOPROLOL TARTRATE 5 MG/5ML IV SOLN: 5.0000 mg | Freq: Once | INTRAVENOUS | Status: AC

## 2022-06-23 MED ORDER — METOPROLOL TARTRATE 5 MG/5ML IV SOLN
INTRAVENOUS | Status: AC
  Administered 2022-06-23: 5 mg via INTRAVENOUS
  Filled 2022-06-23: qty 5

## 2022-06-23 MED ORDER — LACTATED RINGERS IV BOLUS
1000.0000 mL | Freq: Once | INTRAVENOUS | Status: AC
  Administered 2022-06-23: 1000 mL via INTRAVENOUS

## 2022-06-23 MED ORDER — PANTOPRAZOLE 2 MG/ML SUSPENSION
40.0000 mg | Freq: Every day | ORAL | Status: DC
  Administered 2022-06-24 – 2022-07-04 (×11): 40 mg
  Filled 2022-06-23 (×13): qty 20

## 2022-06-23 MED ORDER — IBUPROFEN 200 MG PO TABS: 800.0000 mg | ORAL_TABLET | Freq: Four times a day (QID) | ORAL | Status: DC | PRN

## 2022-06-23 MED ORDER — LORAZEPAM 1 MG PO TABS: 1.0000 mg | ORAL_TABLET | ORAL | Status: AC | PRN

## 2022-06-23 MED ORDER — IOHEXOL 350 MG/ML SOLN
100.0000 mL | Freq: Once | INTRAVENOUS | Status: AC | PRN
  Administered 2022-06-23: 80 mL via INTRAVENOUS

## 2022-06-23 MED ORDER — ADULT MULTIVITAMIN W/MINERALS CH
1.0000 | ORAL_TABLET | Freq: Every day | ORAL | Status: DC
  Administered 2022-06-23 – 2022-07-06 (×14): 1
  Filled 2022-06-23 (×14): qty 1

## 2022-06-23 NOTE — Progress Notes (Signed)
Sputum sample collected walked to Lab.

## 2022-06-23 NOTE — Progress Notes (Signed)
Pharmacy Antibiotic Note  Joseph [name] is a 26 y.o. male admitted on 06/22/2022 with sepsis.  Pharmacy has been consulted for cefepime dosing. -WBC= 7.3, tmax= 103.1, SCr ~ 1.0 -cultures ordered  Plan: -Cefepime 2gm IV q8h (will start after cultures collected) -Will follow renal function, cultures and clinical progress   Height: 5\' 11"  (180.3 cm) (Per Merged chart) Weight: 99.8 kg (220 lb) IBW/kg (Calculated) : 75.3  Temp (24hrs), Avg:100.8 F (38.2 C), Min:99.1 F (37.3 C), Max:103.3 F (39.6 C)  Recent Labs  Lab 06/22/22 0133 06/22/22 0136 06/22/22 0432 06/22/22 1041 06/23/22 0554 06/23/22 1053  WBC 9.5  --  12.0* 7.4  --  7.3  CREATININE 1.38* 1.60* 0.96 0.88 1.07  --   LATICACIDVEN 6.3*  --   --   --   --   --     Estimated Creatinine Clearance: 123.7 mL/min (by C-G formula based on SCr of 1.07 mg/dL).    No Known Allergies  Antimicrobials this admission: 10/10 cefepime   Microbiology results: 10/10 resp 10/10 urine  Thank you for allowing pharmacy to be a part of this patient's care.  Hildred Laser, PharmD Clinical Pharmacist **Pharmacist phone directory can now be found on [email or web address] ([email or web address]).  Listed under [place].

## 2022-06-23 NOTE — Progress Notes (Signed)
HR 140s, Temp 103.3 and climbing despite ice and scheduled tylenol. Ativan given, Dr. Kae Heller paged, awaiting response.

## 2022-06-23 NOTE — Progress Notes (Signed)
  Daily Progress Note  S/p: Left chest exploration, intercostal artery ligation, hemothorax evacuation, hand lac repair  Subjective: Intubated, sedated  Objective: Vitals:   06/23/22 0800 06/23/22 0814  BP: 111/66   Pulse: (!) 111   Resp: 20   Temp: (!) 100.6 F (38.1 C)   SpO2: 100% 100%    Physical Examination Light sedation, moving extremities, unable to follow commands Aline left wrist Left chest dressing with light strike-through.  CT with sanguinous output.   ASSESSMENT/PLAN:   Will discuss CT output with Trauma surgery    Cassandria Santee MD MS [facility] [phone] 06/23/2022  8:23 AM

## 2022-06-23 NOTE — Progress Notes (Signed)
Patient transported to CT & back to room 4N24 on the ventilator with no problems.
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
## 2022-06-23 NOTE — Consult Note (Signed)
[facility] Nurse Consult Note: Reason for Consult: Consult requested for Vac change to abd full thickness post-op wound to begin tomorrow.  [place] team will perform as requested.  Supplies ordered to the bedside. Thank-you,  Julien Girt MSN, [place], [place], [place], [place]

## 2022-06-24 ENCOUNTER — Inpatient Hospital Stay (HOSPITAL_COMMUNITY): Payer: Self-pay

## 2022-06-24 ENCOUNTER — Inpatient Hospital Stay: Payer: Self-pay

## 2022-06-24 LAB — PREPARE CRYOPRECIPITATE
Unit division: 0
Unit division: 0
Unit division: 0

## 2022-06-24 LAB — POCT I-STAT 7, (LYTES, BLD GAS, ICA,H+H)
Acid-Base Excess: 0 mmol/L (ref 0.0–2.0)
Bicarbonate: 26.2 mmol/L (ref 20.0–28.0)
Calcium, Ion: 1.21 mmol/L (ref 1.15–1.40)
HCT: 26 % — ABNORMAL LOW (ref 39.0–52.0)
Hemoglobin: 8.8 g/dL — ABNORMAL LOW (ref 13.0–17.0)
O2 Saturation: 88 %
Patient temperature: 37.5
Potassium: 4.4 mmol/L (ref 3.5–5.1)
Sodium: 139 mmol/L (ref 135–145)
TCO2: 28 mmol/L (ref 22–32)
pCO2 arterial: 47.9 mmHg (ref 32–48)
pH, Arterial: 7.348 — ABNORMAL LOW (ref 7.35–7.45)
pO2, Arterial: 60 mmHg — ABNORMAL LOW (ref 83–108)

## 2022-06-24 LAB — BASIC METABOLIC PANEL
Anion gap: 7 (ref 5–15)
BUN: 13 mg/dL (ref 6–20)
CO2: 25 mmol/L (ref 22–32)
Calcium: 7.5 mg/dL — ABNORMAL LOW (ref 8.9–10.3)
Chloride: 106 mmol/L (ref 98–111)
Creatinine, Ser: 1.01 mg/dL (ref 0.61–1.24)
GFR, Estimated: >60 mL/min (ref >60)
Glucose, Bld: 117 mg/dL — ABNORMAL HIGH (ref 70–99)
Potassium: 4.2 mmol/L (ref 3.5–5.1)
Sodium: 138 mmol/L (ref 135–145)

## 2022-06-24 LAB — TRIGLYCERIDES: Triglycerides: 118 mg/dL (ref <150)

## 2022-06-24 LAB — GLUCOSE, CAPILLARY
Glucose-Capillary: 104 mg/dL — ABNORMAL HIGH (ref 70–99)
Glucose-Capillary: 117 mg/dL — ABNORMAL HIGH (ref 70–99)
Glucose-Capillary: 124 mg/dL — ABNORMAL HIGH (ref 70–99)
Glucose-Capillary: 126 mg/dL — ABNORMAL HIGH (ref 70–99)
Glucose-Capillary: 128 mg/dL — ABNORMAL HIGH (ref 70–99)
Glucose-Capillary: 129 mg/dL — ABNORMAL HIGH (ref 70–99)
Glucose-Capillary: 131 mg/dL — ABNORMAL HIGH (ref 70–99)

## 2022-06-24 LAB — BPAM CRYOPRECIPITATE
Blood Product Expiration Date: 202310091114
Blood Product Expiration Date: 202310091114
Blood Product Expiration Date: 202310091236
ISSUE DATE / TIME: 202310090534
ISSUE DATE / TIME: 202310090534
ISSUE DATE / TIME: 202310090637
Unit Type and Rh: 6200
Unit Type and Rh: 6200
Unit Type and Rh: 6200

## 2022-06-24 LAB — CBC
HCT: 23.8 % — ABNORMAL LOW (ref 39.0–52.0)
Hemoglobin: 8.2 g/dL — ABNORMAL LOW (ref 13.0–17.0)
MCH: 30.7 pg (ref 26.0–34.0)
MCHC: 34.5 g/dL (ref 30.0–36.0)
MCV: 89.1 fL (ref 80.0–100.0)
Platelets: 80 10*3/uL — ABNORMAL LOW (ref 150–400)
RBC: 2.67 MIL/uL — ABNORMAL LOW (ref 4.22–5.81)
RDW: 14.7 % (ref 11.5–15.5)
WBC: 3.4 10*3/uL — ABNORMAL LOW (ref 4.0–10.5)
nRBC: 0 % (ref 0.0–0.2)

## 2022-06-24 MED ORDER — METOPROLOL TARTRATE 5 MG/5ML IV SOLN
5.0000 mg | Freq: Four times a day (QID) | INTRAVENOUS | Status: DC | PRN
  Administered 2022-06-24: 5 mg via INTRAVENOUS
  Filled 2022-06-24: qty 5

## 2022-06-24 MED ORDER — NOREPINEPHRINE 4 MG/250ML-% IV SOLN: 0.0000 ug/min | INTRAVENOUS | Status: DC

## 2022-06-24 MED ORDER — METOPROLOL TARTRATE 5 MG/5ML IV SOLN
5.0000 mg | Freq: Once | INTRAVENOUS | Status: AC
  Administered 2022-06-24: 5 mg via INTRAVENOUS
  Filled 2022-06-24: qty 5

## 2022-06-24 MED ORDER — CHLORHEXIDINE GLUCONATE CLOTH 2 % EX PADS
6.0000 | MEDICATED_PAD | Freq: Every day | CUTANEOUS | Status: DC
  Administered 2022-06-25 – 2022-07-08 (×13): 6 via TOPICAL

## 2022-06-24 MED ORDER — LIDOCAINE-EPINEPHRINE (PF) 2 %-1:200000 IJ SOLN
INTRAMUSCULAR | Status: AC
  Filled 2022-06-24: qty 20
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
## 2022-06-24 NOTE — Consult Note (Signed)
[place] [place] nursing team requested to see patient for NPWT to the midline,  discussed this am with bedside nurse. They placed yesterday and can manage midline, non complicated NPWT dressing  Updated orders for Tu/Th/Sat changes. Made CCS aware and bedside verified ok with updated orders.    Re consult if needed, will not follow at this time. Thanks  Weslee Fogg [employer], RN,CWOCN, CNS, [place] [phone])

## 2022-06-24 NOTE — Progress Notes (Signed)
RT placed pt in PS/CPAP mode 10/5. Pt tolerating well with SVS. RN of pt notified. RT will continue to monitor pt.

## 2022-06-24 NOTE — Progress Notes (Signed)
ABG results given to Dr. Bobbye Morton. Verbal order received to leave pt on wean at this time. RT will continue to monitor and be available as needed.

## 2022-06-24 NOTE — Procedures (Signed)
   Procedure Note  Date: 06/24/2022  Procedure: tube thoracostomy--left    Pre-op diagnosis: left hemothorax  Post-op diagnosis: same  Surgeon: Jesusita Oka, MD  Anesthesia: local   EBL: <5cc  Drains/Implants: 58F chest tube Specimen: none  Description of procedure: Time-out was performed verifying correct patient, procedure, site, laterality. This procedure was performed emergently and therefore informed consent was not obtained. Fifteen cc's of local anesthetic was infiltrated into the tissues just over the fourth intercostal space.  A longitudinal incision was made parallel to the rib at the fourth intercostal space. This incision was deepened down through the muscle until the pleural cavity was entered. Blood was encountered upon entry and a 58F chest tube was inserted through this tract.   The tube was secured at 13cm at the skin with suture and connected to an atrium at -20cm water wall suction. The site was dressed with gauze and tape. The patient tolerated the procedure well. There were no complications. Follow up chest x-ray was ordered to confirm tube positioning, complete evacuation, and complete lung re-expansion.    Jesusita Oka, MD General and [place] Surgery

## 2022-06-24 NOTE — Progress Notes (Signed)
Transition of Care [facility]) - CAGE-AID Screening   Patient Details  Name: Joseph [name] MRN: [id number] Date of Birth: 24-Oct-1995  Elvina Sidle, RN Trauma Response Nurse Phone Number: [phone] 06/24/2022, 4:30 PM   CAGE-AID Screening: Substance Abuse Screening unable to be completed due to: : Patient unable to participate (Pt is intubated in ICU)  Have You Ever Felt You Ought to Cut Down on Your Drinking or Drug Use?: No Have People Annoyed You By Critizing Your Drinking Or Drug Use?: No Have You Felt Bad Or Guilty About Your Drinking Or Drug Use?: No Have You Ever Had a Drink or Used Drugs First Thing In The Morning to Steady Your Nerves or to Get Rid of a Hangover?: No CAGE-AID Score: 0

## 2022-06-24 NOTE — Progress Notes (Signed)
Alerted MD Lovick that patient was tachycardic in the 130s and I had  been given prn ativan per mar orders.  Was given orders for prn lopressor.

## 2022-06-25 ENCOUNTER — Inpatient Hospital Stay (HOSPITAL_COMMUNITY): Payer: Self-pay

## 2022-06-25 LAB — CBC
HCT: 22.8 % — ABNORMAL LOW (ref 39.0–52.0)
HCT: 19.4 % — ABNORMAL LOW (ref 39.0–52.0)
Hemoglobin: 7.8 g/dL — ABNORMAL LOW (ref 13.0–17.0)
Hemoglobin: 6.3 g/dL — CL (ref 13.0–17.0)
MCH: 30.6 pg (ref 26.0–34.0)
MCH: 29.7 pg (ref 26.0–34.0)
MCHC: 34.2 g/dL (ref 30.0–36.0)
MCHC: 32.5 g/dL (ref 30.0–36.0)
MCV: 89.4 fL (ref 80.0–100.0)
MCV: 91.5 fL (ref 80.0–100.0)
Platelets: 111 10*3/uL — ABNORMAL LOW (ref 150–400)
Platelets: 113 10*3/uL — ABNORMAL LOW (ref 150–400)
RBC: 2.55 MIL/uL — ABNORMAL LOW (ref 4.22–5.81)
RBC: 2.12 MIL/uL — ABNORMAL LOW (ref 4.22–5.81)
RDW: 14.7 % (ref 11.5–15.5)
RDW: 14.8 % (ref 11.5–15.5)
WBC: 3.6 10*3/uL — ABNORMAL LOW (ref 4.0–10.5)
WBC: 3.2 10*3/uL — ABNORMAL LOW (ref 4.0–10.5)
nRBC: 0 % (ref 0.0–0.2)
nRBC: 0.9 % — ABNORMAL HIGH (ref 0.0–0.2)

## 2022-06-25 LAB — POCT I-STAT 7, (LYTES, BLD GAS, ICA,H+H)
Acid-Base Excess: 1 mmol/L (ref 0.0–2.0)
Acid-base deficit: 1 mmol/L (ref 0.0–2.0)
Bicarbonate: 25.6 mmol/L (ref 20.0–28.0)
Bicarbonate: 27 mmol/L (ref 20.0–28.0)
Calcium, Ion: 1.16 mmol/L (ref 1.15–1.40)
Calcium, Ion: 1.14 mmol/L — ABNORMAL LOW (ref 1.15–1.40)
HCT: 20 % — ABNORMAL LOW (ref 39.0–52.0)
HCT: 19 % — ABNORMAL LOW (ref 39.0–52.0)
Hemoglobin: 6.8 g/dL — CL (ref 13.0–17.0)
Hemoglobin: 6.5 g/dL — CL (ref 13.0–17.0)
O2 Saturation: 79 %
O2 Saturation: 92 %
Patient temperature: 38.1
Patient temperature: 38.6
Potassium: 3.8 mmol/L (ref 3.5–5.1)
Potassium: 4 mmol/L (ref 3.5–5.1)
Sodium: 140 mmol/L (ref 135–145)
Sodium: 139 mmol/L (ref 135–145)
TCO2: 27 mmol/L (ref 22–32)
TCO2: 29 mmol/L (ref 22–32)
pCO2 arterial: 54 mmHg — ABNORMAL HIGH (ref 32–48)
pCO2 arterial: 57.7 mmHg — ABNORMAL HIGH (ref 32–48)
pH, Arterial: 7.289 — ABNORMAL LOW (ref 7.35–7.45)
pH, Arterial: 7.286 — ABNORMAL LOW (ref 7.35–7.45)
pO2, Arterial: 52 mmHg — ABNORMAL LOW (ref 83–108)
pO2, Arterial: 80 mmHg — ABNORMAL LOW (ref 83–108)

## 2022-06-25 LAB — BASIC METABOLIC PANEL
Anion gap: 11 (ref 5–15)
BUN: 17 mg/dL (ref 6–20)
CO2: 26 mmol/L (ref 22–32)
Calcium: 8.2 mg/dL — ABNORMAL LOW (ref 8.9–10.3)
Chloride: 102 mmol/L (ref 98–111)
Creatinine, Ser: 1.01 mg/dL (ref 0.61–1.24)
GFR, Estimated: >60 mL/min (ref >60)
Glucose, Bld: 126 mg/dL — ABNORMAL HIGH (ref 70–99)
Potassium: 4.2 mmol/L (ref 3.5–5.1)
Sodium: 139 mmol/L (ref 135–145)

## 2022-06-25 LAB — GLUCOSE, CAPILLARY
Glucose-Capillary: 120 mg/dL — ABNORMAL HIGH (ref 70–99)
Glucose-Capillary: 121 mg/dL — ABNORMAL HIGH (ref 70–99)
Glucose-Capillary: 130 mg/dL — ABNORMAL HIGH (ref 70–99)
Glucose-Capillary: 141 mg/dL — ABNORMAL HIGH (ref 70–99)
Glucose-Capillary: 142 mg/dL — ABNORMAL HIGH (ref 70–99)
Glucose-Capillary: 146 mg/dL — ABNORMAL HIGH (ref 70–99)
Glucose-Capillary: 135 mg/dL — ABNORMAL HIGH (ref 70–99)
Glucose-Capillary: 136 mg/dL — ABNORMAL HIGH (ref 70–99)

## 2022-06-25 LAB — URINE CULTURE: Culture: NO GROWTH

## 2022-06-25 LAB — HEMOGLOBIN AND HEMATOCRIT, BLOOD
HCT: 21.5 % — ABNORMAL LOW (ref 39.0–52.0)
Hemoglobin: 7.3 g/dL — ABNORMAL LOW (ref 13.0–17.0)

## 2022-06-25 LAB — PREPARE RBC (CROSSMATCH)

## 2022-06-25 MED ORDER — NOREPINEPHRINE 4 MG/250ML-% IV SOLN
INTRAVENOUS | Status: AC
  Administered 2022-06-25: 2 ug/min via INTRAVENOUS
  Filled 2022-06-25: qty 250

## 2022-06-25 MED ORDER — ALBUMIN HUMAN 5 % IV SOLN
INTRAVENOUS | Status: AC
  Filled 2022-06-25: qty 500

## 2022-06-25 MED ORDER — IBUPROFEN 100 MG/5ML PO SUSP
800.0000 mg | Freq: Four times a day (QID) | ORAL | Status: DC | PRN
  Administered 2022-07-03 – 2022-07-04 (×2): 800 mg
  Filled 2022-06-25 (×2): qty 40

## 2022-06-25 MED ORDER — ALBUMIN HUMAN 5 % IV SOLN
25.0000 g | Freq: Once | INTRAVENOUS | Status: AC
  Administered 2022-06-25: 25 g via INTRAVENOUS

## 2022-06-25 MED ORDER — FENTANYL 2500MCG IN NS 250ML (10MCG/ML) PREMIX INFUSION
50.0000 ug/h | INTRAVENOUS | Status: DC
  Administered 2022-06-26 – 2022-06-28 (×7): 300 ug/h via INTRAVENOUS
  Administered 2022-06-28: 250 ug/h via INTRAVENOUS
  Administered 2022-06-29 (×2): 400 ug/h via INTRAVENOUS
  Administered 2022-06-29: 300 ug/h via INTRAVENOUS
  Administered 2022-06-30 (×2): 400 ug/h via INTRAVENOUS
  Filled 2022-06-25 (×13): qty 250

## 2022-06-25 MED ORDER — SODIUM CHLORIDE 0.9% IV SOLUTION: Freq: Once | INTRAVENOUS | Status: DC

## 2022-06-25 MED ORDER — DEXMEDETOMIDINE HCL IN NACL 400 MCG/100ML IV SOLN
INTRAVENOUS | Status: AC
  Filled 2022-06-25: qty 100

## 2022-06-25 MED ORDER — OSMOLITE 1.5 CAL PO LIQD
1000.0000 mL | ORAL | Status: DC
  Administered 2022-06-26 – 2022-06-30 (×6): 1000 mL

## 2022-06-25 MED ORDER — CLONAZEPAM 0.5 MG PO TABS
0.5000 mg | ORAL_TABLET | Freq: Two times a day (BID) | ORAL | Status: DC
  Administered 2022-06-25 (×2): 0.5 mg
  Filled 2022-06-25 (×2): qty 1

## 2022-06-25 MED ORDER — VECURONIUM BROMIDE 10 MG IV SOLR
0.1000 mg/kg | INTRAVENOUS | Status: DC | PRN
  Administered 2022-06-25 – 2022-06-29 (×2): 10 mg via INTRAVENOUS
  Filled 2022-06-25 (×3): qty 10

## 2022-06-25 MED ORDER — ARTIFICIAL TEARS OPHTHALMIC OINT
1.0000 | TOPICAL_OINTMENT | Freq: Three times a day (TID) | OPHTHALMIC | Status: DC
  Administered 2022-06-25 – 2022-06-29 (×11): 1 via OPHTHALMIC
  Filled 2022-06-25 (×3): qty 3.5

## 2022-06-25 MED ORDER — PROPOFOL 1000 MG/100ML IV EMUL
25.0000 ug/kg/min | INTRAVENOUS | Status: DC
  Administered 2022-06-25: 25 ug/kg/min via INTRAVENOUS
  Administered 2022-06-26: 30.06 ug/kg/min via INTRAVENOUS
  Administered 2022-06-26 (×3): 60 ug/kg/min via INTRAVENOUS
  Administered 2022-06-26: 40 ug/kg/min via INTRAVENOUS
  Administered 2022-06-26 (×2): 30 ug/kg/min via INTRAVENOUS
  Administered 2022-06-27 (×3): 40 ug/kg/min via INTRAVENOUS
  Administered 2022-06-27: 30 ug/kg/min via INTRAVENOUS
  Administered 2022-06-27: 50 ug/kg/min via INTRAVENOUS
  Administered 2022-06-27: 60 ug/kg/min via INTRAVENOUS
  Administered 2022-06-27 – 2022-06-28 (×4): 50 ug/kg/min via INTRAVENOUS
  Administered 2022-06-28 (×2): 60 ug/kg/min via INTRAVENOUS
  Administered 2022-06-29 (×5): 80 ug/kg/min via INTRAVENOUS
  Administered 2022-06-29: 60 ug/kg/min via INTRAVENOUS
  Administered 2022-06-29: 70 ug/kg/min via INTRAVENOUS
  Administered 2022-06-29 (×2): 80 ug/kg/min via INTRAVENOUS
  Administered 2022-06-29: 70 ug/kg/min via INTRAVENOUS
  Administered 2022-06-29 – 2022-06-30 (×4): 80 ug/kg/min via INTRAVENOUS
  Filled 2022-06-25 (×29): qty 100
  Filled 2022-06-25: qty 200
  Filled 2022-06-25 (×7): qty 100

## 2022-06-25 MED ORDER — FENTANYL BOLUS VIA INFUSION
50.0000 ug | INTRAVENOUS | Status: DC | PRN
  Administered 2022-06-26 – 2022-06-29 (×5): 50 ug via INTRAVENOUS

## 2022-06-25 MED ORDER — DEXMEDETOMIDINE HCL IN NACL 400 MCG/100ML IV SOLN
0.4000 ug/kg/h | INTRAVENOUS | Status: DC
  Administered 2022-06-25: 0.4 ug/kg/h via INTRAVENOUS
  Administered 2022-06-25: 0.9 ug/kg/h via INTRAVENOUS
  Administered 2022-06-25 – 2022-06-27 (×12): 1.2 ug/kg/h via INTRAVENOUS
  Administered 2022-06-27: 1 ug/kg/h via INTRAVENOUS
  Administered 2022-06-27 – 2022-06-29 (×13): 1.2 ug/kg/h via INTRAVENOUS
  Administered 2022-06-30: 2 ug/kg/h via INTRAVENOUS
  Administered 2022-06-30: 1.4 ug/kg/h via INTRAVENOUS
  Administered 2022-06-30 (×3): 2 ug/kg/h via INTRAVENOUS
  Administered 2022-07-01 (×2): 1 ug/kg/h via INTRAVENOUS
  Administered 2022-07-01 (×7): 2 ug/kg/h via INTRAVENOUS
  Administered 2022-07-02: 0.9 ug/kg/h via INTRAVENOUS
  Administered 2022-07-02 (×3): 1 ug/kg/h via INTRAVENOUS
  Administered 2022-07-02: 0.4 ug/kg/h via INTRAVENOUS
  Administered 2022-07-02 – 2022-07-03 (×4): 1 ug/kg/h via INTRAVENOUS
  Administered 2022-07-03 (×4): 1.8 ug/kg/h via INTRAVENOUS
  Administered 2022-07-03: 1 ug/kg/h via INTRAVENOUS
  Administered 2022-07-04 (×3): 2 ug/kg/h via INTRAVENOUS
  Administered 2022-07-04: 1.6 ug/kg/h via INTRAVENOUS
  Administered 2022-07-04: 1.8 ug/kg/h via INTRAVENOUS
  Administered 2022-07-04: 2 ug/kg/h via INTRAVENOUS
  Administered 2022-07-04 (×2): 1.6 ug/kg/h via INTRAVENOUS
  Administered 2022-07-04 (×2): 2 ug/kg/h via INTRAVENOUS
  Administered 2022-07-05: 1.6 ug/kg/h via INTRAVENOUS
  Administered 2022-07-05: 1.4 ug/kg/h via INTRAVENOUS
  Administered 2022-07-05: 1.3 ug/kg/h via INTRAVENOUS
  Administered 2022-07-05 (×2): 1.4 ug/kg/h via INTRAVENOUS
  Administered 2022-07-05: 1.8 ug/kg/h via INTRAVENOUS
  Administered 2022-07-05: 1.4 ug/kg/h via INTRAVENOUS
  Administered 2022-07-05 – 2022-07-06 (×2): 1 ug/kg/h via INTRAVENOUS
  Administered 2022-07-06: 1.4 ug/kg/h via INTRAVENOUS
  Filled 2022-06-25: qty 100
  Filled 2022-06-25 (×2): qty 200
  Filled 2022-06-25 (×3): qty 100
  Filled 2022-06-25 (×2): qty 200
  Filled 2022-06-25 (×2): qty 100
  Filled 2022-06-25: qty 200
  Filled 2022-06-25 (×5): qty 100
  Filled 2022-06-25 (×2): qty 200
  Filled 2022-06-25 (×11): qty 100
  Filled 2022-06-25: qty 200
  Filled 2022-06-25 (×2): qty 100
  Filled 2022-06-25: qty 400
  Filled 2022-06-25 (×3): qty 100
  Filled 2022-06-25: qty 200
  Filled 2022-06-25: qty 100
  Filled 2022-06-25: qty 200
  Filled 2022-06-25 (×12): qty 100
  Filled 2022-06-25: qty 400
  Filled 2022-06-25 (×2): qty 100
  Filled 2022-06-25 (×2): qty 200
  Filled 2022-06-25 (×12): qty 100

## 2022-06-25 MED ORDER — NOREPINEPHRINE 4 MG/250ML-% IV SOLN
0.0000 ug/min | INTRAVENOUS | Status: DC
  Administered 2022-06-25 – 2022-06-26 (×2): 5 ug/min via INTRAVENOUS
  Administered 2022-06-26: 6 ug/min via INTRAVENOUS
  Administered 2022-06-27: 5 ug/min via INTRAVENOUS
  Filled 2022-06-25 (×4): qty 250

## 2022-06-25 MED ORDER — FENTANYL CITRATE PF 50 MCG/ML IJ SOSY: 50.0000 ug | PREFILLED_SYRINGE | Freq: Once | INTRAMUSCULAR | Status: DC

## 2022-06-25 MED ORDER — QUETIAPINE FUMARATE 25 MG PO TABS
50.0000 mg | ORAL_TABLET | Freq: Two times a day (BID) | ORAL | Status: DC
  Administered 2022-06-25 (×2): 50 mg
  Filled 2022-06-25 (×2): qty 2

## 2022-06-25 NOTE — Progress Notes (Signed)
Pt placed on SBT this morning. Adequate RR and volumes achieved on 8/5 30%. PT continues to have significant agitation towards ETT repositioning and suctioning. Pt attempted to kick this RT during morning assessment. RN at bedside and MD made aware. RT will continue to monitor and be available as needed.
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
## 2022-06-25 NOTE — Progress Notes (Signed)
Called to bedside for sudden drop in O2 Sats. ETT tube placement assessed, cuff checked for leak, bilat BS audible, equal and clear. Pt sedated and calm. Manually ventilated pt w/ambu bag. O2 sats increased slowly to 84% over 12 minutes. Placed back on vent @ 100% O2, increased and insp time w/gradual increase in Sats to 91%. Post CXR and ABG, RR increased to 24 and I time to 1.1 w/gradual increase sats to 95%. Pt sedated and paralyzed. Will follow up and attempt to return vent settings to previous.

## 2022-06-25 NOTE — Progress Notes (Signed)
Patient ID: Joseph [name], male   DOB: 1995-11-28, 28 y.o.   MRN: [id number] Ptient needs 1u PRBC. No family available for blood consent. Emergency consent. Georganna Skeans, MD, MPH, FACS Please use [email or web address] to contact on call provider

## 2022-06-25 NOTE — Progress Notes (Signed)
Patient very agitated and attempting to kick RT this morning. CIWA 27. I will give 4 mg ativan per order. Dr Grandville Silos notified.  Montez Hageman RN
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
## 2022-06-25 NOTE — Progress Notes (Signed)
I was called to the bedside to evaluate the patient after he a decrease in O2 sats to the 60s. On my arrival, bag ventilation was in progress with SpO2 in the low 80s. Breath sounds clear and equal bilaterally, no air leak noted in chest tubes. Patient was comfortably sedated. O2 sats slowly improved to 85% and patient was placed back on the vent on PRVC. CXR stable with no pneumothorax. Significant gastric distension noted, feeds paused and OG placed to suction. In spite of sedation, patient was noted to have significant vent dyssynchrony. 2mg  Ativan administered with fentanyl bolus, with no improvement in dyssynchrony. Propofol infusion started and after confirming adequate sedation (no response to painful stimuli), the patient was subsequently paralyzed with 10mg  vecuronium. O2 sats have slowly improved to 95% on 100% FiO2 and PEEP 10. Will continue current vent settings for now.  Patient had a CTA of the chest two days ago that did not show any PE. He is febrile and respiratory cultures are growing abundant GNRs. Patient is already on cefepime for treatment of pneumonia. Continue current vent settings for now.

## 2022-06-25 NOTE — Progress Notes (Signed)
Date and time results received: 06/25/22 1600 (use smartphrase ".now" to insert current time)  Test: hemoglobin Critical Value: 6.3  Name of Provider Notified: Dr Grandville Silos  Orders Received? Or Actions Taken?: 1 u RBC

## 2022-06-26 ENCOUNTER — Inpatient Hospital Stay (HOSPITAL_COMMUNITY): Payer: Self-pay

## 2022-06-26 LAB — TRIGLYCERIDES: Triglycerides: 260 mg/dL — ABNORMAL HIGH (ref <150)

## 2022-06-26 LAB — BPAM RBC
Blood Product Expiration Date: 202310272359
Blood Product Expiration Date: 202310282359
Blood Product Expiration Date: 202310282359
Blood Product Expiration Date: 202310282359
Blood Product Expiration Date: 202310282359
Blood Product Expiration Date: 202310282359
Blood Product Expiration Date: 202310282359
Blood Product Expiration Date: 202310282359
Blood Product Expiration Date: 202310292359
Blood Product Expiration Date: 202310292359
Blood Product Expiration Date: 202310292359
Blood Product Expiration Date: 202310292359
Blood Product Expiration Date: 202310292359
Blood Product Expiration Date: 202310312359
Blood Product Expiration Date: 202310312359
Blood Product Expiration Date: 202311012359
Blood Product Expiration Date: 202311012359
Blood Product Expiration Date: 202311012359
Blood Product Expiration Date: 202311022359
Blood Product Expiration Date: 202311032359
Blood Product Expiration Date: 202311052359
Blood Product Expiration Date: 202311052359
ISSUE DATE / TIME: 202310090206
ISSUE DATE / TIME: 202310090206
ISSUE DATE / TIME: 202310090206
ISSUE DATE / TIME: 202310090206
ISSUE DATE / TIME: 202310090523
ISSUE DATE / TIME: 202310090523
ISSUE DATE / TIME: 202310090617
ISSUE DATE / TIME: 202310090617
ISSUE DATE / TIME: 202310090617
ISSUE DATE / TIME: 202310090617
ISSUE DATE / TIME: 202310090617
ISSUE DATE / TIME: 202310090617
ISSUE DATE / TIME: 202310090617
ISSUE DATE / TIME: 202310090630
ISSUE DATE / TIME: 202310090630
ISSUE DATE / TIME: 202310091151
ISSUE DATE / TIME: 202310091429
ISSUE DATE / TIME: 202310091728
ISSUE DATE / TIME: 202310091728
ISSUE DATE / TIME: 202310091946
ISSUE DATE / TIME: 202310121619
ISSUE DATE / TIME: 202310130757
Unit Type and Rh: 5100
Unit Type and Rh: 5100
Unit Type and Rh: 5100
Unit Type and Rh: 5100
Unit Type and Rh: 6200
Unit Type and Rh: 6200
Unit Type and Rh: 6200
Unit Type and Rh: 6200
Unit Type and Rh: 6200
Unit Type and Rh: 6200
Unit Type and Rh: 6200
Unit Type and Rh: 6200
Unit Type and Rh: 6200
Unit Type and Rh: 6200
Unit Type and Rh: 6200
Unit Type and Rh: 6200
Unit Type and Rh: 6200
Unit Type and Rh: 6200
Unit Type and Rh: 6200
Unit Type and Rh: 6200
Unit Type and Rh: 6200
Unit Type and Rh: 6200

## 2022-06-26 LAB — POCT I-STAT 7, (LYTES, BLD GAS, ICA,H+H)
Acid-Base Excess: 0 mmol/L (ref 0.0–2.0)
Acid-Base Excess: 1 mmol/L (ref 0.0–2.0)
Bicarbonate: 25.2 mmol/L (ref 20.0–28.0)
Bicarbonate: 25.9 mmol/L (ref 20.0–28.0)
Calcium, Ion: 1.19 mmol/L (ref 1.15–1.40)
Calcium, Ion: 1.22 mmol/L (ref 1.15–1.40)
HCT: 21 % — ABNORMAL LOW (ref 39.0–52.0)
HCT: 28 % — ABNORMAL LOW (ref 39.0–52.0)
Hemoglobin: 7.1 g/dL — ABNORMAL LOW (ref 13.0–17.0)
Hemoglobin: 9.5 g/dL — ABNORMAL LOW (ref 13.0–17.0)
O2 Saturation: 96 %
O2 Saturation: 92 %
Patient temperature: 37.6
Potassium: 4.1 mmol/L (ref 3.5–5.1)
Potassium: 4.1 mmol/L (ref 3.5–5.1)
Sodium: 140 mmol/L (ref 135–145)
Sodium: 140 mmol/L (ref 135–145)
TCO2: 27 mmol/L (ref 22–32)
TCO2: 27 mmol/L (ref 22–32)
pCO2 arterial: 46.5 mmHg (ref 32–48)
pCO2 arterial: 43.7 mmHg (ref 32–48)
pH, Arterial: 7.344 — ABNORMAL LOW (ref 7.35–7.45)
pH, Arterial: 7.38 (ref 7.35–7.45)
pO2, Arterial: 87 mmHg (ref 83–108)
pO2, Arterial: 65 mmHg — ABNORMAL LOW (ref 83–108)

## 2022-06-26 LAB — TYPE AND SCREEN
ABO/RH(D): AB POS
Antibody Screen: NEGATIVE
Unit division: 0
Unit division: 0
Unit division: 0
Unit division: 0
Unit division: 0
Unit division: 0
Unit division: 0
Unit division: 0
Unit division: 0
Unit division: 0
Unit division: 0
Unit division: 0
Unit division: 0
Unit division: 0
Unit division: 0
Unit division: 0
Unit division: 0
Unit division: 0
Unit division: 0
Unit division: 0
Unit division: 0
Unit division: 0

## 2022-06-26 LAB — BASIC METABOLIC PANEL
Anion gap: 6 (ref 5–15)
BUN: 18 mg/dL (ref 6–20)
CO2: 27 mmol/L (ref 22–32)
Calcium: 8.2 mg/dL — ABNORMAL LOW (ref 8.9–10.3)
Chloride: 108 mmol/L (ref 98–111)
Creatinine, Ser: 1.07 mg/dL (ref 0.61–1.24)
GFR, Estimated: >60 mL/min (ref >60)
Glucose, Bld: 107 mg/dL — ABNORMAL HIGH (ref 70–99)
Potassium: 4.2 mmol/L (ref 3.5–5.1)
Sodium: 141 mmol/L (ref 135–145)

## 2022-06-26 LAB — D-DIMER, QUANTITATIVE: D-Dimer, Quant: 5.83 ug/mL-FEU — ABNORMAL HIGH (ref 0.00–0.50)

## 2022-06-26 LAB — CBC
HCT: 21.5 % — ABNORMAL LOW (ref 39.0–52.0)
HCT: 23 % — ABNORMAL LOW (ref 39.0–52.0)
Hemoglobin: 7.3 g/dL — ABNORMAL LOW (ref 13.0–17.0)
Hemoglobin: 8.1 g/dL — ABNORMAL LOW (ref 13.0–17.0)
MCH: 29.8 pg (ref 26.0–34.0)
MCH: 30.8 pg (ref 26.0–34.0)
MCHC: 34 g/dL (ref 30.0–36.0)
MCHC: 35.2 g/dL (ref 30.0–36.0)
MCV: 87.8 fL (ref 80.0–100.0)
MCV: 87.5 fL (ref 80.0–100.0)
Platelets: 127 10*3/uL — ABNORMAL LOW (ref 150–400)
Platelets: 146 10*3/uL — ABNORMAL LOW (ref 150–400)
RBC: 2.45 MIL/uL — ABNORMAL LOW (ref 4.22–5.81)
RBC: 2.63 MIL/uL — ABNORMAL LOW (ref 4.22–5.81)
RDW: 15.9 % — ABNORMAL HIGH (ref 11.5–15.5)
RDW: 15.9 % — ABNORMAL HIGH (ref 11.5–15.5)
WBC: 4.5 10*3/uL (ref 4.0–10.5)
WBC: 6.4 10*3/uL (ref 4.0–10.5)
nRBC: 0.9 % — ABNORMAL HIGH (ref 0.0–0.2)
nRBC: 0.6 % — ABNORMAL HIGH (ref 0.0–0.2)

## 2022-06-26 LAB — CULTURE, RESPIRATORY W GRAM STAIN: Culture: NORMAL

## 2022-06-26 LAB — GLUCOSE, CAPILLARY
Glucose-Capillary: 110 mg/dL — ABNORMAL HIGH (ref 70–99)
Glucose-Capillary: 96 mg/dL (ref 70–99)
Glucose-Capillary: 85 mg/dL (ref 70–99)
Glucose-Capillary: 106 mg/dL — ABNORMAL HIGH (ref 70–99)
Glucose-Capillary: 108 mg/dL — ABNORMAL HIGH (ref 70–99)
Glucose-Capillary: 110 mg/dL — ABNORMAL HIGH (ref 70–99)

## 2022-06-26 LAB — PREPARE RBC (CROSSMATCH)

## 2022-06-26 MED ORDER — SODIUM CHLORIDE 0.9% IV SOLUTION: Freq: Once | INTRAVENOUS | Status: DC

## 2022-06-26 MED ORDER — QUETIAPINE FUMARATE 100 MG PO TABS
100.0000 mg | ORAL_TABLET | Freq: Two times a day (BID) | ORAL | Status: DC
  Administered 2022-06-26 – 2022-06-29 (×7): 100 mg
  Filled 2022-06-26 (×7): qty 1

## 2022-06-26 MED ORDER — CLONAZEPAM 1 MG PO TABS
1.0000 mg | ORAL_TABLET | Freq: Two times a day (BID) | ORAL | Status: DC
  Administered 2022-06-26 – 2022-07-06 (×19): 1 mg
  Filled 2022-06-26 (×20): qty 1

## 2022-06-26 NOTE — Progress Notes (Signed)
Phlebotomy notified that patient is now a lab draw.  Montez Hageman RN
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
## 2022-06-26 NOTE — Progress Notes (Signed)
Cortrak Tube Team Note:  Consult received to advance Cortrak into post pyloric position.  This RD and additional Cortrak RD both attempted to advance Cortrak tube to post pyloric position but unsuccessful. Of note, Cortrak tube tip prior to adjustments noted to be flipped up in the stomach pointed back to GE junction. Tube tip now appears to be in much better gastric position.   X-ray is required, abdominal x-ray has been ordered by the Cortrak team. Please confirm tube placement before using the Cortrak tube.   If the tube becomes dislodged please keep the tube and contact the Cortrak team at [email or web address] for replacement.  If after hours and replacement cannot be delayed, place a NG tube and confirm placement with an abdominal x-ray.   Kerman Passey MS, RDN, LDN, CNSC Registered Dietitian 3 Clinical Nutrition RD Pager and On-Call Pager Number Located in [place]

## 2022-06-27 ENCOUNTER — Inpatient Hospital Stay (HOSPITAL_COMMUNITY): Payer: Self-pay

## 2022-06-27 LAB — BASIC METABOLIC PANEL
Anion gap: 9 (ref 5–15)
BUN: 18 mg/dL (ref 6–20)
CO2: 24 mmol/L (ref 22–32)
Calcium: 8.5 mg/dL — ABNORMAL LOW (ref 8.9–10.3)
Chloride: 107 mmol/L (ref 98–111)
Creatinine, Ser: 0.96 mg/dL (ref 0.61–1.24)
GFR, Estimated: >60 mL/min (ref >60)
Glucose, Bld: 101 mg/dL — ABNORMAL HIGH (ref 70–99)
Potassium: 4.1 mmol/L (ref 3.5–5.1)
Sodium: 140 mmol/L (ref 135–145)

## 2022-06-27 LAB — CBC
HCT: 25.3 % — ABNORMAL LOW (ref 39.0–52.0)
Hemoglobin: 8.9 g/dL — ABNORMAL LOW (ref 13.0–17.0)
MCH: 30.8 pg (ref 26.0–34.0)
MCHC: 35.2 g/dL (ref 30.0–36.0)
MCV: 87.5 fL (ref 80.0–100.0)
Platelets: 189 10*3/uL (ref 150–400)
RBC: 2.89 MIL/uL — ABNORMAL LOW (ref 4.22–5.81)
RDW: 15.8 % — ABNORMAL HIGH (ref 11.5–15.5)
WBC: 9.8 10*3/uL (ref 4.0–10.5)
nRBC: 0.3 % — ABNORMAL HIGH (ref 0.0–0.2)

## 2022-06-27 LAB — POCT I-STAT 7, (LYTES, BLD GAS, ICA,H+H)
Acid-Base Excess: 2 mmol/L (ref 0.0–2.0)
Bicarbonate: 26.4 mmol/L (ref 20.0–28.0)
Calcium, Ion: 1.19 mmol/L (ref 1.15–1.40)
HCT: 22 % — ABNORMAL LOW (ref 39.0–52.0)
Hemoglobin: 7.5 g/dL — ABNORMAL LOW (ref 13.0–17.0)
O2 Saturation: 98 %
Patient temperature: 102.8
Potassium: 4 mmol/L (ref 3.5–5.1)
Sodium: 139 mmol/L (ref 135–145)
TCO2: 28 mmol/L (ref 22–32)
pCO2 arterial: 45.5 mmHg (ref 32–48)
pH, Arterial: 7.381 (ref 7.35–7.45)
pO2, Arterial: 120 mmHg — ABNORMAL HIGH (ref 83–108)

## 2022-06-27 LAB — BPAM RBC
Blood Product Expiration Date: 202311012359
ISSUE DATE / TIME: 202310131135
Unit Type and Rh: 6200

## 2022-06-27 LAB — TYPE AND SCREEN
ABO/RH(D): AB POS
Antibody Screen: NEGATIVE
Unit division: 0

## 2022-06-27 LAB — GLUCOSE, CAPILLARY
Glucose-Capillary: 78 mg/dL (ref 70–99)
Glucose-Capillary: 133 mg/dL — ABNORMAL HIGH (ref 70–99)
Glucose-Capillary: 97 mg/dL (ref 70–99)
Glucose-Capillary: 121 mg/dL — ABNORMAL HIGH (ref 70–99)
Glucose-Capillary: 130 mg/dL — ABNORMAL HIGH (ref 70–99)

## 2022-06-27 NOTE — Progress Notes (Signed)
Rectal probe temp 95.9. Warm blankets placed with no improvement in temperature. Bair hugger applied, Dr. Dema Severin made aware on rounds.

## 2022-06-27 NOTE — Progress Notes (Signed)
Rectal probe temp 97.9 . Cooling blanket switched to monitor.
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
## 2022-06-28 ENCOUNTER — Inpatient Hospital Stay: Payer: Self-pay

## 2022-06-28 LAB — CBC
HCT: 23.3 % — ABNORMAL LOW (ref 39.0–52.0)
Hemoglobin: 7.7 g/dL — ABNORMAL LOW (ref 13.0–17.0)
MCH: 30.1 pg (ref 26.0–34.0)
MCHC: 33 g/dL (ref 30.0–36.0)
MCV: 91 fL (ref 80.0–100.0)
Platelets: 277 10*3/uL (ref 150–400)
RBC: 2.56 MIL/uL — ABNORMAL LOW (ref 4.22–5.81)
RDW: 15.9 % — ABNORMAL HIGH (ref 11.5–15.5)
WBC: 12.1 10*3/uL — ABNORMAL HIGH (ref 4.0–10.5)
nRBC: 0.3 % — ABNORMAL HIGH (ref 0.0–0.2)

## 2022-06-28 LAB — GLUCOSE, CAPILLARY
Glucose-Capillary: 101 mg/dL — ABNORMAL HIGH (ref 70–99)
Glucose-Capillary: 102 mg/dL — ABNORMAL HIGH (ref 70–99)
Glucose-Capillary: 113 mg/dL — ABNORMAL HIGH (ref 70–99)
Glucose-Capillary: 117 mg/dL — ABNORMAL HIGH (ref 70–99)
Glucose-Capillary: 124 mg/dL — ABNORMAL HIGH (ref 70–99)
Glucose-Capillary: 89 mg/dL (ref 70–99)

## 2022-06-28 LAB — BASIC METABOLIC PANEL
Anion gap: 6 (ref 5–15)
BUN: 21 mg/dL — ABNORMAL HIGH (ref 6–20)
CO2: 26 mmol/L (ref 22–32)
Calcium: 8.4 mg/dL — ABNORMAL LOW (ref 8.9–10.3)
Chloride: 106 mmol/L (ref 98–111)
Creatinine, Ser: 0.73 mg/dL (ref 0.61–1.24)
GFR, Estimated: >60 mL/min (ref >60)
Glucose, Bld: 118 mg/dL — ABNORMAL HIGH (ref 70–99)
Potassium: 3.4 mmol/L — ABNORMAL LOW (ref 3.5–5.1)
Sodium: 138 mmol/L (ref 135–145)

## 2022-06-28 MED ORDER — SODIUM CHLORIDE 0.9% FLUSH
10.0000 mL | Freq: Two times a day (BID) | INTRAVENOUS | Status: DC
  Administered 2022-06-28: 10 mL
  Administered 2022-06-28 – 2022-06-29 (×2): 30 mL
  Administered 2022-06-29 – 2022-07-01 (×4): 10 mL
  Administered 2022-07-01: 20 mL
  Administered 2022-07-02: 10 mL
  Administered 2022-07-02: 20 mL
  Administered 2022-07-03: 10 mL
  Administered 2022-07-03: 30 mL
  Administered 2022-07-04 – 2022-07-05 (×3): 10 mL

## 2022-06-28 MED ORDER — SODIUM CHLORIDE 0.9% FLUSH: 10.0000 mL | INTRAVENOUS | Status: DC | PRN
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
## 2022-06-28 NOTE — Progress Notes (Signed)
Peripherally Inserted Central Catheter Placement  The IV Nurse has discussed with the patient and/or persons authorized to consent for the patient, the purpose of this procedure and the potential benefits and risks involved with this procedure.  The benefits include less needle sticks, lab draws from the catheter, and the patient may be discharged home with the catheter. Risks include, but not limited to, infection, bleeding, blood clot (thrombus formation), and puncture of an artery; nerve damage and irregular heartbeat and possibility to perform a PICC exchange if needed/ordered by physician.  Alternatives to this procedure were also discussed.  Bard Power PICC patient education guide, fact sheet on infection prevention and patient information card has been provided to patient /or left at bedside.    PICC Placement Documentation  PICC Triple Lumen 06/28/22 Left Brachial 40 cm 0 cm (Active)  Indication for Insertion or Continuance of Line Vasoactive infusions 06/28/22 1300  Exposed Catheter (cm) 0 cm 06/28/22 1300  Site Assessment Clean, Dry, Intact 06/28/22 1300  Lumen #1 Status Flushed;Saline locked;Blood return noted 06/28/22 1300  Lumen #2 Status Flushed;Saline locked;Blood return noted 06/28/22 1300  Lumen #3 Status Flushed;Saline locked;Blood return noted 06/28/22 1300  Dressing Type Transparent;Securing device 06/28/22 1300  Dressing Status Antimicrobial disc in place;Clean, Dry, Intact 06/28/22 1300  Safety Lock Not Applicable 63/78/58 8502  Line Care Connections checked and tightened 06/28/22 1300  Line Adjustment (NICU/IV Team Only) No 06/28/22 1300  Dressing Intervention New dressing 06/28/22 1300  Dressing Change Due 07/05/22 06/28/22 [place] 06/28/2022, 1:24 PM

## 2022-06-28 NOTE — Progress Notes (Addendum)
PICC line medically necessary 2/2 inability to obtain labs from numerous attempts at peripheral draw.  Check [email or web address] for General Surgery coverage night/weekend/holidays  Page if acute issues. No secure chat available for me given surgeries/clinic/off post call which would lead to a delay in care.  Nadeen Landau, MD [facility], [place] [facility]

## 2022-06-29 ENCOUNTER — Inpatient Hospital Stay (HOSPITAL_COMMUNITY): Payer: Self-pay

## 2022-06-29 LAB — POCT I-STAT 7, (LYTES, BLD GAS, ICA,H+H)
Acid-Base Excess: 1 mmol/L (ref 0.0–2.0)
Bicarbonate: 26 mmol/L (ref 20.0–28.0)
Calcium, Ion: 1.24 mmol/L (ref 1.15–1.40)
HCT: 23 % — ABNORMAL LOW (ref 39.0–52.0)
Hemoglobin: 7.8 g/dL — ABNORMAL LOW (ref 13.0–17.0)
O2 Saturation: 100 %
Potassium: 4 mmol/L (ref 3.5–5.1)
Sodium: 141 mmol/L (ref 135–145)
TCO2: 27 mmol/L (ref 22–32)
pCO2 arterial: 43.1 mmHg (ref 32–48)
pH, Arterial: 7.389 (ref 7.35–7.45)
pO2, Arterial: 183 mmHg — ABNORMAL HIGH (ref 83–108)

## 2022-06-29 LAB — TRIGLYCERIDES: Triglycerides: 402 mg/dL — ABNORMAL HIGH (ref <150)

## 2022-06-29 LAB — GLUCOSE, CAPILLARY
Glucose-Capillary: 108 mg/dL — ABNORMAL HIGH (ref 70–99)
Glucose-Capillary: 119 mg/dL — ABNORMAL HIGH (ref 70–99)
Glucose-Capillary: 125 mg/dL — ABNORMAL HIGH (ref 70–99)
Glucose-Capillary: 126 mg/dL — ABNORMAL HIGH (ref 70–99)
Glucose-Capillary: 129 mg/dL — ABNORMAL HIGH (ref 70–99)
Glucose-Capillary: 131 mg/dL — ABNORMAL HIGH (ref 70–99)

## 2022-06-29 LAB — CBC
HCT: 23.8 % — ABNORMAL LOW (ref 39.0–52.0)
Hemoglobin: 7.7 g/dL — ABNORMAL LOW (ref 13.0–17.0)
MCH: 29.5 pg (ref 26.0–34.0)
MCHC: 32.4 g/dL (ref 30.0–36.0)
MCV: 91.2 fL (ref 80.0–100.0)
Platelets: 346 10*3/uL (ref 150–400)
RBC: 2.61 MIL/uL — ABNORMAL LOW (ref 4.22–5.81)
RDW: 15.7 % — ABNORMAL HIGH (ref 11.5–15.5)
WBC: 11.8 10*3/uL — ABNORMAL HIGH (ref 4.0–10.5)
nRBC: 0.3 % — ABNORMAL HIGH (ref 0.0–0.2)

## 2022-06-29 LAB — BASIC METABOLIC PANEL
Anion gap: 10 (ref 5–15)
BUN: 19 mg/dL (ref 6–20)
CO2: 26 mmol/L (ref 22–32)
Calcium: 8.7 mg/dL — ABNORMAL LOW (ref 8.9–10.3)
Chloride: 103 mmol/L (ref 98–111)
Creatinine, Ser: 0.76 mg/dL (ref 0.61–1.24)
GFR, Estimated: >60 mL/min (ref >60)
Glucose, Bld: 130 mg/dL — ABNORMAL HIGH (ref 70–99)
Potassium: 3.5 mmol/L (ref 3.5–5.1)
Sodium: 139 mmol/L (ref 135–145)

## 2022-06-29 MED ORDER — MIDAZOLAM HCL (PF) 10 MG/2ML IJ SOLN
INTRAMUSCULAR | Status: AC
  Administered 2022-06-29: 10 mg
  Filled 2022-06-29: qty 2

## 2022-06-29 MED ORDER — POTASSIUM CHLORIDE 20 MEQ PO PACK
40.0000 meq | PACK | Freq: Once | ORAL | Status: AC
  Administered 2022-06-29: 40 meq
  Filled 2022-06-29: qty 2

## 2022-06-29 MED ORDER — VECURONIUM BROMIDE 10 MG IV SOLR: 10.0000 mg | Freq: Once | INTRAVENOUS | Status: AC

## 2022-06-29 MED ORDER — MIDAZOLAM HCL 2 MG/2ML IJ SOLN
2.0000 mg | INTRAMUSCULAR | Status: AC | PRN
  Administered 2022-06-30: 4 mg via INTRAVENOUS
  Filled 2022-06-29: qty 4

## 2022-06-29 MED ORDER — QUETIAPINE FUMARATE 200 MG PO TABS
200.0000 mg | ORAL_TABLET | Freq: Two times a day (BID) | ORAL | Status: DC
  Administered 2022-06-29 – 2022-06-30 (×3): 200 mg
  Filled 2022-06-29 (×4): qty 1

## 2022-06-29 MED ORDER — SODIUM CHLORIDE 0.9 % IV SOLN
2.0000 g | Freq: Three times a day (TID) | INTRAVENOUS | Status: DC
  Administered 2022-06-29 – 2022-07-01 (×7): 2 g via INTRAVENOUS
  Filled 2022-06-29 (×7): qty 12.5

## 2022-06-29 MED ORDER — MIDAZOLAM HCL 2 MG/2ML IJ SOLN: 2.0000 mg | INTRAMUSCULAR | Status: DC | PRN
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
## 2022-06-29 NOTE — Progress Notes (Signed)
Pharmacy Electrolyte Replacement  Recent Labs:  Recent Labs    06/29/22 0434  K 3.5  CREATININE 0.76    Low Critical Values (K </= 2.5, Phos </= 1, Mg </= 1) Present: None  MD Contacted: n/a  Plan: KCl 25mEq per tube x1 dose. Follow-up BMP in AM.   Sloan Leiter, PharmD, BCPS, BCCCP Clinical Pharmacist Please refer to [facility] for [place] numbers 06/29/2022, 10:19 AM

## 2022-06-29 NOTE — Progress Notes (Signed)
Patient ID: Joseph [name], male   DOB: Aug 07, 1996, 26 y.o.   MRN: [id number] RN reported patient desat. Increased PEEP to 12. Good volumes. I did a recruitemnt maneuver then bagged him. Easy to bag. Sats now 100%. New pulse ox placed. Will check CXR and abg.  Georganna Skeans, MD, MPH, FACS Please use [email or web address] to contact on call provider

## 2022-06-30 ENCOUNTER — Inpatient Hospital Stay (HOSPITAL_COMMUNITY): Payer: Self-pay

## 2022-06-30 LAB — BASIC METABOLIC PANEL
Anion gap: 11 (ref 5–15)
BUN: 17 mg/dL (ref 6–20)
CO2: 25 mmol/L (ref 22–32)
Calcium: 8.9 mg/dL (ref 8.9–10.3)
Chloride: 106 mmol/L (ref 98–111)
Creatinine, Ser: 0.73 mg/dL (ref 0.61–1.24)
GFR, Estimated: >60 mL/min (ref >60)
Glucose, Bld: 134 mg/dL — ABNORMAL HIGH (ref 70–99)
Potassium: 4.5 mmol/L (ref 3.5–5.1)
Sodium: 142 mmol/L (ref 135–145)

## 2022-06-30 LAB — CBC
HCT: 24.6 % — ABNORMAL LOW (ref 39.0–52.0)
Hemoglobin: 7.8 g/dL — ABNORMAL LOW (ref 13.0–17.0)
MCH: 29.2 pg (ref 26.0–34.0)
MCHC: 31.7 g/dL (ref 30.0–36.0)
MCV: 92.1 fL (ref 80.0–100.0)
Platelets: 471 10*3/uL — ABNORMAL HIGH (ref 150–400)
RBC: 2.67 MIL/uL — ABNORMAL LOW (ref 4.22–5.81)
RDW: 15.8 % — ABNORMAL HIGH (ref 11.5–15.5)
WBC: 15.4 10*3/uL — ABNORMAL HIGH (ref 4.0–10.5)
nRBC: 0.3 % — ABNORMAL HIGH (ref 0.0–0.2)

## 2022-06-30 LAB — GLUCOSE, CAPILLARY
Glucose-Capillary: 117 mg/dL — ABNORMAL HIGH (ref 70–99)
Glucose-Capillary: 144 mg/dL — ABNORMAL HIGH (ref 70–99)
Glucose-Capillary: 91 mg/dL (ref 70–99)
Glucose-Capillary: 129 mg/dL — ABNORMAL HIGH (ref 70–99)

## 2022-06-30 MED ORDER — ORAL CARE MOUTH RINSE
15.0000 mL | OROMUCOSAL | Status: DC
  Administered 2022-06-30 – 2022-07-02 (×7): 15 mL via OROMUCOSAL

## 2022-06-30 MED ORDER — HALOPERIDOL LACTATE 5 MG/ML IJ SOLN: 10.0000 mg | Freq: Four times a day (QID) | INTRAMUSCULAR | Status: DC | PRN

## 2022-06-30 MED ORDER — MIDAZOLAM HCL 2 MG/2ML IJ SOLN
2.0000 mg | INTRAMUSCULAR | Status: DC | PRN
  Administered 2022-06-30 – 2022-07-02 (×4): 2 mg via INTRAVENOUS
  Filled 2022-06-30 (×5): qty 2

## 2022-06-30 MED ORDER — HALOPERIDOL LACTATE 5 MG/ML IJ SOLN
10.0000 mg | Freq: Four times a day (QID) | INTRAMUSCULAR | Status: DC | PRN
  Administered 2022-06-30 – 2022-07-01 (×4): 10 mg via INTRAVENOUS
  Filled 2022-06-30 (×3): qty 2

## 2022-06-30 MED ORDER — ORAL CARE MOUTH RINSE: 15.0000 mL | OROMUCOSAL | Status: DC | PRN

## 2022-06-30 NOTE — ED Notes (Signed)
Trauma Event Note    TRN at bedside to round. Pt alert and appropriate, has provided staff with next of kin phone numbers. Per report, day shift RN made attempts to call family and were not able to reach anyone, would not allow leave a voicemail. I attempted to reach mother, sent to voicemail after two rings. Left voicemail with TRN phone number to return call. Demographics updated.   Mother Skipper Cliche [phone]  Last imported Vital Signs BP 133/75   Pulse (!) 106   Temp (!) 101.1 F (38.4 C)   Resp (!) 28   Ht 5\' 11"  (1.803 m) Comment: Per Merged chart  Wt 257 lb 8 oz (116.8 kg)   SpO2 99%   BMI 35.91 kg/m   Trending CBC Recent Labs    06/28/22 1505 06/29/22 0434 06/29/22 1130 06/30/22 0531  WBC 12.1* 11.8*  --  15.4*  HGB 7.7* 7.7* 7.8* 7.8*  HCT 23.3* 23.8* 23.0* 24.6*  PLT 277 346  --  471*    Trending Coag's No results for input(s): "APTT", "INR" in the last 72 hours.  Trending BMET Recent Labs    06/28/22 1505 06/29/22 0434 06/29/22 1130 06/30/22 0531  NA 138 139 141 142  K 3.4* 3.5 4.0 4.5  CL 106 103  --  106  CO2 26 26  --  25  BUN 21* 19  --  17  CREATININE 0.73 0.76  --  0.73  GLUCOSE 118* 130*  --  134*      Berkeley Vanaken O Ananda Caya  Trauma Response RN  Please call TRN at [phone] for further assistance.
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
## 2022-06-30 NOTE — Procedures (Signed)
Extubation Procedure Note  Patient Details:   Name: Joseph [name] DOB: 22-Feb-1996 MRN: [id number]   Airway Documentation:    Vent end date: 06/30/22 Vent end time: 1225   Evaluation  O2 sats: stable throughout Complications: No apparent complications Patient did tolerate procedure well. Bilateral Breath Sounds: Rhonchi   Yes  Pt extubated per physician order. Dr. Bobbye Morton at pt bedside during extubation procedure. Pt suctioned via ETT and orally prior, positive cuff leak. Upon extubation pt able to speak name, give a good cough and no stridor heard at this time. Pt placed on 4L nasal cannula at this time. RT will continue to monitor and be available as needed.   Sharla Kidney 06/30/2022, 12:32 PM

## 2022-07-01 ENCOUNTER — Inpatient Hospital Stay (HOSPITAL_COMMUNITY): Payer: Self-pay

## 2022-07-01 DIAGNOSIS — M7989 Other specified soft tissue disorders: Secondary | ICD-10-CM

## 2022-07-01 LAB — CBC
HCT: 23.1 % — ABNORMAL LOW (ref 39.0–52.0)
Hemoglobin: 7.6 g/dL — ABNORMAL LOW (ref 13.0–17.0)
MCH: 29.7 pg (ref 26.0–34.0)
MCHC: 32.9 g/dL (ref 30.0–36.0)
MCV: 90.2 fL (ref 80.0–100.0)
Platelets: 533 10*3/uL — ABNORMAL HIGH (ref 150–400)
RBC: 2.56 MIL/uL — ABNORMAL LOW (ref 4.22–5.81)
RDW: 15.2 % (ref 11.5–15.5)
WBC: 16.5 10*3/uL — ABNORMAL HIGH (ref 4.0–10.5)
nRBC: 0.2 % (ref 0.0–0.2)

## 2022-07-01 LAB — CULTURE, RESPIRATORY W GRAM STAIN

## 2022-07-01 LAB — HEPATIC FUNCTION PANEL
ALT: 45 U/L — ABNORMAL HIGH (ref 0–44)
AST: 42 U/L — ABNORMAL HIGH (ref 15–41)
Albumin: 1.7 g/dL — ABNORMAL LOW (ref 3.5–5.0)
Alkaline Phosphatase: 126 U/L (ref 38–126)
Bilirubin, Direct: 0.1 mg/dL (ref 0.0–0.2)
Indirect Bilirubin: 0.4 mg/dL (ref 0.3–0.9)
Total Bilirubin: 0.5 mg/dL (ref 0.3–1.2)
Total Protein: 5.7 g/dL — ABNORMAL LOW (ref 6.5–8.1)

## 2022-07-01 LAB — BASIC METABOLIC PANEL
Anion gap: 9 (ref 5–15)
BUN: 17 mg/dL (ref 6–20)
CO2: 26 mmol/L (ref 22–32)
Calcium: 8.2 mg/dL — ABNORMAL LOW (ref 8.9–10.3)
Chloride: 106 mmol/L (ref 98–111)
Creatinine, Ser: 0.76 mg/dL (ref 0.61–1.24)
GFR, Estimated: >60 mL/min (ref >60)
Glucose, Bld: 144 mg/dL — ABNORMAL HIGH (ref 70–99)
Potassium: 4 mmol/L (ref 3.5–5.1)
Sodium: 141 mmol/L (ref 135–145)

## 2022-07-01 LAB — GLUCOSE, CAPILLARY
Glucose-Capillary: 123 mg/dL — ABNORMAL HIGH (ref 70–99)
Glucose-Capillary: 125 mg/dL — ABNORMAL HIGH (ref 70–99)
Glucose-Capillary: 134 mg/dL — ABNORMAL HIGH (ref 70–99)
Glucose-Capillary: 135 mg/dL — ABNORMAL HIGH (ref 70–99)
Glucose-Capillary: 142 mg/dL — ABNORMAL HIGH (ref 70–99)
Glucose-Capillary: 153 mg/dL — ABNORMAL HIGH (ref 70–99)

## 2022-07-01 MED ORDER — SENNOSIDES 8.8 MG/5ML PO SYRP
5.0000 mL | ORAL_SOLUTION | Freq: Two times a day (BID) | ORAL | Status: DC
  Administered 2022-07-02 – 2022-07-05 (×7): 5 mL
  Filled 2022-07-01 (×8): qty 5

## 2022-07-01 MED ORDER — DOCUSATE SODIUM 50 MG/5ML PO LIQD
50.0000 mg | Freq: Two times a day (BID) | ORAL | Status: DC
  Administered 2022-07-02 – 2022-07-05 (×7): 50 mg
  Filled 2022-07-01 (×6): qty 10

## 2022-07-01 MED ORDER — POLYETHYLENE GLYCOL 3350 17 G PO PACK
17.0000 g | PACK | Freq: Two times a day (BID) | ORAL | Status: DC
  Administered 2022-07-03 – 2022-07-05 (×6): 17 g
  Filled 2022-07-01 (×7): qty 1

## 2022-07-01 MED ORDER — BISACODYL 10 MG RE SUPP: 10.0000 mg | Freq: Once | RECTAL | Status: DC

## 2022-07-01 MED ORDER — OLANZAPINE 5 MG PO TBDP
5.0000 mg | ORAL_TABLET | Freq: Two times a day (BID) | ORAL | Status: DC
  Administered 2022-07-01 (×2): 5 mg via ORAL
  Filled 2022-07-01 (×3): qty 1

## 2022-07-01 MED ORDER — DIPHENHYDRAMINE HCL 50 MG/ML IJ SOLN
50.0000 mg | Freq: Four times a day (QID) | INTRAMUSCULAR | Status: DC | PRN
  Administered 2022-07-01 (×2): 50 mg via INTRAVENOUS
  Filled 2022-07-01 (×3): qty 1

## 2022-07-01 MED ORDER — HALOPERIDOL LACTATE 5 MG/ML IJ SOLN: 10.0000 mg | Freq: Four times a day (QID) | INTRAMUSCULAR | Status: DC | PRN

## 2022-07-01 MED ORDER — ENSURE ENLIVE PO LIQD: 237.0000 mL | Freq: Two times a day (BID) | ORAL | Status: DC

## 2022-07-01 MED ORDER — THIAMINE HCL 100 MG/ML IJ SOLN
500.0000 mg | INTRAVENOUS | Status: AC
  Administered 2022-07-01 – 2022-07-03 (×3): 500 mg via INTRAVENOUS
  Filled 2022-07-01 (×4): qty 5

## 2022-07-01 MED ORDER — HALOPERIDOL LACTATE 5 MG/ML IJ SOLN
5.0000 mg | Freq: Four times a day (QID) | INTRAMUSCULAR | Status: DC | PRN
  Administered 2022-07-01 – 2022-07-04 (×3): 5 mg via INTRAVENOUS
  Filled 2022-07-01 (×3): qty 1

## 2022-07-01 MED ORDER — CEFAZOLIN SODIUM-DEXTROSE 2-4 GM/100ML-% IV SOLN
2.0000 g | Freq: Three times a day (TID) | INTRAVENOUS | Status: AC
  Administered 2022-07-01 – 2022-07-05 (×13): 2 g via INTRAVENOUS
  Filled 2022-07-01 (×14): qty 100

## 2022-07-01 MED ORDER — MAGNESIUM HYDROXIDE 400 MG/5ML PO SUSP: 30.0000 mL | Freq: Once | ORAL | Status: DC

## 2022-07-01 MED ORDER — BISACODYL 5 MG PO TBEC: 10.0000 mg | DELAYED_RELEASE_TABLET | Freq: Once | ORAL | Status: DC

## 2022-07-01 MED ORDER — SENNA 8.6 MG PO TABS: 2.0000 | ORAL_TABLET | Freq: Once | ORAL | Status: DC

## 2022-07-01 MED ORDER — MAGNESIUM CITRATE PO SOLN
1.0000 | Freq: Once | ORAL | Status: DC
  Filled 2022-07-01: qty 296
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
## 2022-07-01 NOTE — Progress Notes (Signed)
SLP Cancellation Note  Patient Details Name: Joseph [name] MRN: [id number] DOB: 1995/09/18   Cancelled treatment:       Reason Eval/Treat Not Completed: Other (comment) (Per RN, pt recently fell asleep and pt's RN requested that the evaluation be deferred to allow him to rest a bit longer. SLP will follow up later this morning.)  Keyleen Cerrato I. Hardin Negus, [place], [place] Office number [phone]  Horton Marshall 07/01/2022, 8:43 AM

## 2022-07-01 NOTE — Progress Notes (Signed)
Ok to change cefepime to cefazolin to complete 7d for MSSA/klebsiella PNA per Dr Bobbye Morton.  Onnie Boer, PharmD, BCIDP, AAHIVP, CPP Infectious Disease Pharmacist 07/01/2022 5:35 PM

## 2022-07-01 NOTE — Progress Notes (Signed)
Orthopedic Tech Progress Note Patient Details:  Adolfo Granieri [birth year] [id number]  Ortho Devices Type of Ortho Device: Cotton web roll, Ace wrap, Thumb spica splint Splint Material: Plaster Ortho Device/Splint Location: LUE Ortho Device/Splint Interventions: Ordered, Application, Adjustment   Post Interventions Patient Tolerated: Well Instructions Provided: Care of device  Janit Pagan 07/01/2022, 11:45 AM
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
## 2022-07-01 NOTE — Progress Notes (Signed)
OT Cancellation Note  Patient Details Name: Joseph [name] MRN: [id number] DOB: 06/13/1996   Cancelled Treatment:    Reason Eval/Treat Not Completed: Fatigue/lethargy limiting ability to participate. Per RN, pt recently fell asleep and pt's RN requested that the evaluation be deferred to allow him to rest a bit longer. OT will continue to follow acutely and will return as schedule allows.   [place] 07/01/2022, 8:48 AM  Jesse Sans OTR/L Acute Rehabilitation Services Office: [phone]

## 2022-07-01 NOTE — Progress Notes (Signed)
? ?  Inpatient Rehab Admissions Coordinator : ? ?Per therapy recommendations, patient was screened for CIR candidacy by Chukwuebuka Churchill RN MSN.  At this time patient appears to be a potential candidate for CIR. I will place a rehab consult per protocol for full assessment. Please call me with any questions. ? ?Joselynn Amoroso RN MSN ?Admissions Coordinator ?[phone] ?  ?

## 2022-07-01 NOTE — Progress Notes (Signed)
Upper extremity venous duplex has been completed.   Preliminary results in CV Proc.   Joseph [name] 07/01/2022 11:53 AM
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
## 2022-07-01 NOTE — Progress Notes (Signed)
   07/01/22 1500  Clinical Encounter Type  Visited With Patient  Visit Type Follow-up  Referral From Nurse  Consult/Referral To Chaplain   Chaplain responded to a spiritual consult for prayer. The patient, Joseph [name], was pleased with the visit and asked that I simply pray. During my visit he appeared very anxious, and when I said goodbye he seemed irritated. I noticed he had a lot of movement with his arms as well.   Danice Goltz [facility] [phone]

## 2022-07-02 ENCOUNTER — Inpatient Hospital Stay (HOSPITAL_COMMUNITY): Payer: Self-pay

## 2022-07-02 LAB — POCT I-STAT 7, (LYTES, BLD GAS, ICA,H+H)
Acid-Base Excess: 2 mmol/L (ref 0.0–2.0)
Acid-Base Excess: 2 mmol/L (ref 0.0–2.0)
Bicarbonate: 24.8 mmol/L (ref 20.0–28.0)
Bicarbonate: 26.7 mmol/L (ref 20.0–28.0)
Calcium, Ion: 1.21 mmol/L (ref 1.15–1.40)
Calcium, Ion: 1.21 mmol/L (ref 1.15–1.40)
HCT: 24 % — ABNORMAL LOW (ref 39.0–52.0)
HCT: 22 % — ABNORMAL LOW (ref 39.0–52.0)
Hemoglobin: 8.2 g/dL — ABNORMAL LOW (ref 13.0–17.0)
Hemoglobin: 7.5 g/dL — ABNORMAL LOW (ref 13.0–17.0)
O2 Saturation: 94 %
O2 Saturation: 100 %
Patient temperature: 99.7
Patient temperature: 101.4
Potassium: 4 mmol/L (ref 3.5–5.1)
Potassium: 4 mmol/L (ref 3.5–5.1)
Sodium: 142 mmol/L (ref 135–145)
Sodium: 141 mmol/L (ref 135–145)
TCO2: 26 mmol/L (ref 22–32)
TCO2: 28 mmol/L (ref 22–32)
pCO2 arterial: 30.7 mmHg — ABNORMAL LOW (ref 32–48)
pCO2 arterial: 42.4 mmHg (ref 32–48)
pH, Arterial: 7.517 — ABNORMAL HIGH (ref 7.35–7.45)
pH, Arterial: 7.414 (ref 7.35–7.45)
pO2, Arterial: 63 mmHg — ABNORMAL LOW (ref 83–108)
pO2, Arterial: 203 mmHg — ABNORMAL HIGH (ref 83–108)

## 2022-07-02 LAB — CBC
HCT: 23.6 % — ABNORMAL LOW (ref 39.0–52.0)
Hemoglobin: 7.6 g/dL — ABNORMAL LOW (ref 13.0–17.0)
MCH: 29.3 pg (ref 26.0–34.0)
MCHC: 32.2 g/dL (ref 30.0–36.0)
MCV: 91.1 fL (ref 80.0–100.0)
Platelets: 627 10*3/uL — ABNORMAL HIGH (ref 150–400)
RBC: 2.59 MIL/uL — ABNORMAL LOW (ref 4.22–5.81)
RDW: 15.1 % (ref 11.5–15.5)
WBC: 15.6 10*3/uL — ABNORMAL HIGH (ref 4.0–10.5)
nRBC: 0 % (ref 0.0–0.2)

## 2022-07-02 LAB — CULTURE, BLOOD (ROUTINE X 2)
Culture: NO GROWTH
Culture: NO GROWTH
Special Requests: ADEQUATE
Special Requests: ADEQUATE

## 2022-07-02 LAB — FOLATE: Folate: 11.6 ng/mL (ref >5.9)

## 2022-07-02 LAB — GLUCOSE, CAPILLARY
Glucose-Capillary: 124 mg/dL — ABNORMAL HIGH (ref 70–99)
Glucose-Capillary: 123 mg/dL — ABNORMAL HIGH (ref 70–99)
Glucose-Capillary: 117 mg/dL — ABNORMAL HIGH (ref 70–99)
Glucose-Capillary: 105 mg/dL — ABNORMAL HIGH (ref 70–99)
Glucose-Capillary: 102 mg/dL — ABNORMAL HIGH (ref 70–99)
Glucose-Capillary: 109 mg/dL — ABNORMAL HIGH (ref 70–99)

## 2022-07-02 LAB — BASIC METABOLIC PANEL
Anion gap: 10 (ref 5–15)
BUN: 17 mg/dL (ref 6–20)
CO2: 25 mmol/L (ref 22–32)
Calcium: 8.6 mg/dL — ABNORMAL LOW (ref 8.9–10.3)
Chloride: 106 mmol/L (ref 98–111)
Creatinine, Ser: 0.86 mg/dL (ref 0.61–1.24)
GFR, Estimated: >60 mL/min (ref >60)
Glucose, Bld: 116 mg/dL — ABNORMAL HIGH (ref 70–99)
Potassium: 4 mmol/L (ref 3.5–5.1)
Sodium: 141 mmol/L (ref 135–145)

## 2022-07-02 LAB — VITAMIN B12: Vitamin B-12: 813 pg/mL (ref 180–914)

## 2022-07-02 LAB — TSH: TSH: 0.586 u[IU]/mL (ref 0.350–4.500)

## 2022-07-02 MED ORDER — POLYETHYLENE GLYCOL 3350 17 G PO PACK: 17.0000 g | PACK | Freq: Every day | ORAL | Status: DC

## 2022-07-02 MED ORDER — PROPOFOL 1000 MG/100ML IV EMUL
INTRAVENOUS | Status: AC
  Filled 2022-07-02: qty 100

## 2022-07-02 MED ORDER — ETOMIDATE 2 MG/ML IV SOLN
20.0000 mg | Freq: Once | INTRAVENOUS | Status: AC
  Administered 2022-07-02: 20 mg via INTRAVENOUS
  Filled 2022-07-02: qty 10

## 2022-07-02 MED ORDER — FENTANYL 2500MCG IN NS 250ML (10MCG/ML) PREMIX INFUSION
0.0000 ug/h | INTRAVENOUS | Status: DC
  Administered 2022-07-02: 250 ug/h via INTRAVENOUS
  Administered 2022-07-02: 50 ug/h via INTRAVENOUS
  Administered 2022-07-02: 250 ug/h via INTRAVENOUS
  Administered 2022-07-03: 200 ug/h via INTRAVENOUS
  Administered 2022-07-04: 150 ug/h via INTRAVENOUS
  Filled 2022-07-02 (×4): qty 250

## 2022-07-02 MED ORDER — FENTANYL 2500MCG IN NS 250ML (10MCG/ML) PREMIX INFUSION
INTRAVENOUS | Status: AC
  Filled 2022-07-02: qty 250

## 2022-07-02 MED ORDER — FENTANYL CITRATE PF 50 MCG/ML IJ SOSY
50.0000 ug | PREFILLED_SYRINGE | Freq: Once | INTRAMUSCULAR | Status: AC
  Administered 2022-07-02: 50 ug via INTRAVENOUS

## 2022-07-02 MED ORDER — FENTANYL BOLUS VIA INFUSION
50.0000 ug | INTRAVENOUS | Status: DC | PRN
  Administered 2022-07-04: 100 ug via INTRAVENOUS

## 2022-07-02 MED ORDER — ORAL CARE MOUTH RINSE: 15.0000 mL | OROMUCOSAL | Status: DC | PRN

## 2022-07-02 MED ORDER — DIPHENHYDRAMINE HCL 50 MG/ML IJ SOLN: 25.0000 mg | Freq: Three times a day (TID) | INTRAMUSCULAR | Status: DC | PRN

## 2022-07-02 MED ORDER — PROPOFOL 1000 MG/100ML IV EMUL
5.0000 ug/kg/min | INTRAVENOUS | Status: DC
  Administered 2022-07-02: 60 ug/kg/min via INTRAVENOUS
  Administered 2022-07-02: 50 ug/kg/min via INTRAVENOUS
  Administered 2022-07-02: 70 ug/kg/min via INTRAVENOUS
  Administered 2022-07-02: 40 ug/kg/min via INTRAVENOUS
  Administered 2022-07-02: 80 ug/kg/min via INTRAVENOUS
  Administered 2022-07-02: 20 ug/kg/min via INTRAVENOUS
  Administered 2022-07-03: 25 ug/kg/min via INTRAVENOUS
  Administered 2022-07-03: 15 ug/kg/min via INTRAVENOUS
  Administered 2022-07-03: 30 ug/kg/min via INTRAVENOUS
  Administered 2022-07-03: 35 ug/kg/min via INTRAVENOUS
  Administered 2022-07-04 (×2): 20 ug/kg/min via INTRAVENOUS
  Filled 2022-07-02 (×12): qty 100

## 2022-07-02 MED ORDER — ORAL CARE MOUTH RINSE
15.0000 mL | OROMUCOSAL | Status: DC
  Administered 2022-07-02 – 2022-07-04 (×24): 15 mL via OROMUCOSAL

## 2022-07-02 MED ORDER — PEPTAMEN 1.5 CAL PO LIQD
1000.0000 mL | ORAL | Status: DC
  Administered 2022-07-02 – 2022-07-05 (×5): 1000 mL
  Filled 2022-07-02 (×7): qty 1000

## 2022-07-02 MED ORDER — OLANZAPINE 5 MG PO TABS
5.0000 mg | ORAL_TABLET | Freq: Two times a day (BID) | ORAL | Status: DC
  Administered 2022-07-02 – 2022-07-06 (×9): 5 mg
  Filled 2022-07-02 (×9): qty 1

## 2022-07-02 MED ORDER — ROCURONIUM BROMIDE 10 MG/ML (PF) SYRINGE
100.0000 mg | PREFILLED_SYRINGE | Freq: Once | INTRAVENOUS | Status: AC
  Administered 2022-07-02: 100 mg via INTRAVENOUS
  Filled 2022-07-02: qty 10
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
## 2022-07-02 NOTE — Procedures (Signed)
Intubation Procedure Note  Joseph [name]  [id number]  09/16/1995  Date:07/02/22  Time:8:57 AM   Provider Performing: Jesusita Oka    Procedure: Intubation (97673)  Indication(s) Respiratory Failure  Consent Unable to obtain consent due to emergent nature of procedure.   Anesthesia Etomidate and Rocuronium   Time Out Verified patient identification, verified procedure, site/side was marked, verified correct patient position, special equipment/implants available, medications/allergies/relevant history reviewed, required imaging and test results available.   Sterile Technique Usual hand hygeine, masks, and gloves were used   Procedure Description Patient positioned in bed supine.  Sedation given as noted above.  Patient was intubated with endotracheal tube using Glidescope.  View was Grade 1 full glottis .  Number of attempts was 1.  Colorimetric CO2 detector was consistent with tracheal placement.   Complications/Tolerance None; patient tolerated the procedure well. Chest X-ray is ordered to verify placement.   EBL none   Specimen(s) None

## 2022-07-02 NOTE — Progress Notes (Signed)
Patient having increased tachypnea with RR 30-45. Sp02=93% on 15lpm high flow cannula. Patient stated he is not having a hard time breathing. Placed patient on heated high flow at 20lpm with 60%. Will continue to monitor patients respiratory status.
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
## 2022-07-02 NOTE — Progress Notes (Signed)
   07/02/22 1215  Clinical Encounter Type  Visited With Patient not available  Visit Type Follow-up  Referral From Chaplain  Consult/Referral To Chaplain   Chaplain attempted to visit with patent during rounding. The patient, Joseph [name], was resting quietly.  Chaplain will continue to monitor.   Danice Goltz [facility]  [phone]

## 2022-07-02 NOTE — Progress Notes (Signed)
SLP Cancellation Note  Patient Details Name: Ulric Salzman MRN: [id number] DOB: 01-Jun-1996   Cancelled treatment:       Reason Eval/Treat Not Completed: Patient not medically ready (Pt has been reintubated today. SLP will follow up as appropriate.)  Truett Mcfarlan I. Hardin Negus, [place], [place] Office number [phone]  Horton Marshall 07/02/2022, 10:11 AM

## 2022-07-02 NOTE — Progress Notes (Signed)
RN notified TRN Ryan of patient's respiratory status. Versed and oxycodone PRN given.

## 2022-07-02 NOTE — Progress Notes (Signed)
IP rehab admissions - Noted patient intubated and on the vent today.  Not medically ready to consider inpatient rehab admission.  Once patient is off the vent, participating with therapies again, then can re-consult for CIR potential.  Call me for questions.  [phone]

## 2022-07-03 LAB — GLUCOSE, CAPILLARY
Glucose-Capillary: 109 mg/dL — ABNORMAL HIGH (ref 70–99)
Glucose-Capillary: 96 mg/dL (ref 70–99)
Glucose-Capillary: 127 mg/dL — ABNORMAL HIGH (ref 70–99)
Glucose-Capillary: 114 mg/dL — ABNORMAL HIGH (ref 70–99)
Glucose-Capillary: 114 mg/dL — ABNORMAL HIGH (ref 70–99)
Glucose-Capillary: 115 mg/dL — ABNORMAL HIGH (ref 70–99)

## 2022-07-03 LAB — TRIGLYCERIDES: Triglycerides: 118 mg/dL (ref <150)

## 2022-07-03 MED ORDER — SODIUM CHLORIDE 0.9 % IV SOLN: INTRAVENOUS | Status: DC | PRN
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
## 2022-07-03 NOTE — Progress Notes (Signed)
SLP Cancellation Note  Patient Details Name: Joseph [name] MRN: [id number] DOB: June 22, 1996   Cancelled treatment:       Reason Eval/Treat Not Completed: Patient not medically ready. SLP continues to follow as pt is on vent.     Osie Bond., M.A. [place] Office [phone]  Secure chat preferred  07/03/2022, 7:59 AM
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
## 2022-07-04 ENCOUNTER — Inpatient Hospital Stay (HOSPITAL_COMMUNITY): Payer: Self-pay

## 2022-07-04 LAB — CBC
HCT: 24.5 % — ABNORMAL LOW (ref 39.0–52.0)
Hemoglobin: 7.9 g/dL — ABNORMAL LOW (ref 13.0–17.0)
MCH: 29.6 pg (ref 26.0–34.0)
MCHC: 32.2 g/dL (ref 30.0–36.0)
MCV: 91.8 fL (ref 80.0–100.0)
Platelets: 854 10*3/uL — ABNORMAL HIGH (ref 150–400)
RBC: 2.67 MIL/uL — ABNORMAL LOW (ref 4.22–5.81)
RDW: 14.6 % (ref 11.5–15.5)
WBC: 12.5 10*3/uL — ABNORMAL HIGH (ref 4.0–10.5)
nRBC: 0 % (ref 0.0–0.2)

## 2022-07-04 LAB — GLUCOSE, CAPILLARY
Glucose-Capillary: 121 mg/dL — ABNORMAL HIGH (ref 70–99)
Glucose-Capillary: 112 mg/dL — ABNORMAL HIGH (ref 70–99)
Glucose-Capillary: 107 mg/dL — ABNORMAL HIGH (ref 70–99)
Glucose-Capillary: 121 mg/dL — ABNORMAL HIGH (ref 70–99)
Glucose-Capillary: 110 mg/dL — ABNORMAL HIGH (ref 70–99)
Glucose-Capillary: 120 mg/dL — ABNORMAL HIGH (ref 70–99)

## 2022-07-04 LAB — BASIC METABOLIC PANEL
Anion gap: 7 (ref 5–15)
BUN: 17 mg/dL (ref 6–20)
CO2: 26 mmol/L (ref 22–32)
Calcium: 8.3 mg/dL — ABNORMAL LOW (ref 8.9–10.3)
Chloride: 105 mmol/L (ref 98–111)
Creatinine, Ser: 0.73 mg/dL (ref 0.61–1.24)
GFR, Estimated: >60 mL/min (ref >60)
Glucose, Bld: 124 mg/dL — ABNORMAL HIGH (ref 70–99)
Potassium: 3.7 mmol/L (ref 3.5–5.1)
Sodium: 138 mmol/L (ref 135–145)

## 2022-07-04 MED ORDER — THIAMINE MONONITRATE 100 MG PO TABS
100.0000 mg | ORAL_TABLET | Freq: Every day | ORAL | Status: DC
  Administered 2022-07-04 – 2022-07-06 (×3): 100 mg
  Filled 2022-07-04 (×3): qty 1
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
## 2022-07-04 NOTE — Procedures (Signed)
Extubation Procedure Note  Patient Details:   Name: Joseph [name] DOB: 1996-08-17 MRN: [id number]   Airway Documentation:  Airway 7.5 mm (Active)  Secured at (cm) 25 cm 07/04/22 0756  Measured From Lips 07/04/22 0756  Secured Location Left 07/04/22 0756  Secured By [employer] 07/04/22 0756  Tube Holder Repositioned Yes 07/04/22 0756  Prone position No 07/04/22 0756  Cuff Pressure (cm H2O) Green OR 18-26 CmH2O 07/04/22 0756  Site Condition Dry 07/04/22 0756   Vent end date: 06/30/22 Vent end time: 1225   Evaluation  O2 sats: stable throughout Complications: No apparent complications Patient did tolerate procedure well. Bilateral Breath Sounds: Clear   Yes Pt extubated to 4L [place]. Vitals stable throughout. Will continue to monitor.  Lawernce Keas 07/04/2022, 11:18 AM

## 2022-07-04 NOTE — Progress Notes (Signed)
Propofol off from [phone]. Pt extremely agitated, banging siderails, sweating gagging, reaching for ETT. Sedation resumed for pt's safety.

## 2022-07-04 NOTE — Progress Notes (Signed)
Pt's Significant other Truman Hayward updated at bedside. Pt's father Sascha Baugher Sr.updated over phone.

## 2022-07-05 LAB — CBC
HCT: 25.9 % — ABNORMAL LOW (ref 39.0–52.0)
Hemoglobin: 8.6 g/dL — ABNORMAL LOW (ref 13.0–17.0)
MCH: 30.2 pg (ref 26.0–34.0)
MCHC: 33.2 g/dL (ref 30.0–36.0)
MCV: 90.9 fL (ref 80.0–100.0)
Platelets: 928 10*3/uL (ref 150–400)
RBC: 2.85 MIL/uL — ABNORMAL LOW (ref 4.22–5.81)
RDW: 14.5 % (ref 11.5–15.5)
WBC: 10.6 10*3/uL — ABNORMAL HIGH (ref 4.0–10.5)
nRBC: 0 % (ref 0.0–0.2)

## 2022-07-05 LAB — GLUCOSE, CAPILLARY
Glucose-Capillary: 117 mg/dL — ABNORMAL HIGH (ref 70–99)
Glucose-Capillary: 123 mg/dL — ABNORMAL HIGH (ref 70–99)
Glucose-Capillary: 129 mg/dL — ABNORMAL HIGH (ref 70–99)
Glucose-Capillary: 132 mg/dL — ABNORMAL HIGH (ref 70–99)
Glucose-Capillary: 134 mg/dL — ABNORMAL HIGH (ref 70–99)
Glucose-Capillary: 140 mg/dL — ABNORMAL HIGH (ref 70–99)

## 2022-07-05 LAB — BASIC METABOLIC PANEL
Anion gap: 8 (ref 5–15)
BUN: 13 mg/dL (ref 6–20)
CO2: 28 mmol/L (ref 22–32)
Calcium: 8.5 mg/dL — ABNORMAL LOW (ref 8.9–10.3)
Chloride: 104 mmol/L (ref 98–111)
Creatinine, Ser: 0.75 mg/dL (ref 0.61–1.24)
GFR, Estimated: >60 mL/min (ref >60)
Glucose, Bld: 129 mg/dL — ABNORMAL HIGH (ref 70–99)
Potassium: 3.5 mmol/L (ref 3.5–5.1)
Sodium: 140 mmol/L (ref 135–145)

## 2022-07-05 NOTE — Progress Notes (Signed)
Critical platelets of 928, on call MD paged
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
## 2022-07-06 LAB — BASIC METABOLIC PANEL
Anion gap: 7 (ref 5–15)
BUN: 14 mg/dL (ref 6–20)
CO2: 27 mmol/L (ref 22–32)
Calcium: 8.4 mg/dL — ABNORMAL LOW (ref 8.9–10.3)
Chloride: 106 mmol/L (ref 98–111)
Creatinine, Ser: 0.66 mg/dL (ref 0.61–1.24)
GFR, Estimated: >60 mL/min (ref >60)
Glucose, Bld: 119 mg/dL — ABNORMAL HIGH (ref 70–99)
Potassium: 3.2 mmol/L — ABNORMAL LOW (ref 3.5–5.1)
Sodium: 140 mmol/L (ref 135–145)

## 2022-07-06 LAB — GLUCOSE, CAPILLARY
Glucose-Capillary: 100 mg/dL — ABNORMAL HIGH (ref 70–99)
Glucose-Capillary: 101 mg/dL — ABNORMAL HIGH (ref 70–99)
Glucose-Capillary: 101 mg/dL — ABNORMAL HIGH (ref 70–99)
Glucose-Capillary: 87 mg/dL (ref 70–99)
Glucose-Capillary: 88 mg/dL (ref 70–99)
Glucose-Capillary: 98 mg/dL (ref 70–99)

## 2022-07-06 LAB — CBC
HCT: 25.9 % — ABNORMAL LOW (ref 39.0–52.0)
Hemoglobin: 8.4 g/dL — ABNORMAL LOW (ref 13.0–17.0)
MCH: 29.8 pg (ref 26.0–34.0)
MCHC: 32.4 g/dL (ref 30.0–36.0)
MCV: 91.8 fL (ref 80.0–100.0)
Platelets: 892 10*3/uL — ABNORMAL HIGH (ref 150–400)
RBC: 2.82 MIL/uL — ABNORMAL LOW (ref 4.22–5.81)
RDW: 14.8 % (ref 11.5–15.5)
WBC: 8.7 10*3/uL (ref 4.0–10.5)
nRBC: 0 % (ref 0.0–0.2)

## 2022-07-06 LAB — TRIGLYCERIDES: Triglycerides: 73 mg/dL (ref <150)

## 2022-07-06 LAB — VITAMIN B1: Vitamin B1 (Thiamine): 217 nmol/L — ABNORMAL HIGH (ref 66.5–200.0)

## 2022-07-06 MED ORDER — ADULT MULTIVITAMIN W/MINERALS CH
1.0000 | ORAL_TABLET | Freq: Every day | ORAL | Status: DC
  Administered 2022-07-07 – 2022-07-10 (×4): 1 via ORAL
  Filled 2022-07-06 (×4): qty 1

## 2022-07-06 MED ORDER — OXYCODONE HCL 5 MG PO TABS
5.0000 mg | ORAL_TABLET | ORAL | Status: DC | PRN
  Administered 2022-07-06 (×2): 5 mg via ORAL
  Administered 2022-07-07 – 2022-07-08 (×2): 10 mg via ORAL
  Filled 2022-07-06: qty 2
  Filled 2022-07-06: qty 1
  Filled 2022-07-06: qty 2
  Filled 2022-07-06: qty 1

## 2022-07-06 MED ORDER — METHOCARBAMOL 500 MG PO TABS
1000.0000 mg | ORAL_TABLET | Freq: Three times a day (TID) | ORAL | Status: DC
  Administered 2022-07-06 – 2022-07-10 (×12): 1000 mg via ORAL
  Filled 2022-07-06 (×12): qty 2

## 2022-07-06 MED ORDER — ENSURE ENLIVE PO LIQD
237.0000 mL | Freq: Two times a day (BID) | ORAL | Status: DC
  Administered 2022-07-07 – 2022-07-10 (×6): 237 mL via ORAL

## 2022-07-06 MED ORDER — POLYETHYLENE GLYCOL 3350 17 G PO PACK
17.0000 g | PACK | Freq: Two times a day (BID) | ORAL | Status: DC
  Filled 2022-07-06 (×4): qty 1

## 2022-07-06 MED ORDER — POTASSIUM CHLORIDE CRYS ER 20 MEQ PO TBCR
40.0000 meq | EXTENDED_RELEASE_TABLET | Freq: Once | ORAL | Status: AC
  Administered 2022-07-06: 40 meq via ORAL
  Filled 2022-07-06: qty 2

## 2022-07-06 MED ORDER — THIAMINE MONONITRATE 100 MG PO TABS
100.0000 mg | ORAL_TABLET | Freq: Every day | ORAL | Status: DC
  Administered 2022-07-07 – 2022-07-10 (×4): 100 mg via ORAL
  Filled 2022-07-06 (×4): qty 1

## 2022-07-06 MED ORDER — FOLIC ACID 1 MG PO TABS
1.0000 mg | ORAL_TABLET | Freq: Every day | ORAL | Status: DC
  Administered 2022-07-07 – 2022-07-10 (×4): 1 mg via ORAL
  Filled 2022-07-06 (×4): qty 1

## 2022-07-06 MED ORDER — SENNOSIDES-DOCUSATE SODIUM 8.6-50 MG PO TABS
1.0000 | ORAL_TABLET | Freq: Two times a day (BID) | ORAL | Status: DC
  Administered 2022-07-08: 1 via ORAL
  Filled 2022-07-06 (×4): qty 1

## 2022-07-06 MED ORDER — POTASSIUM CHLORIDE 20 MEQ PO PACK: 40.0000 meq | PACK | Freq: Once | ORAL | Status: DC

## 2022-07-06 MED ORDER — OLANZAPINE 5 MG PO TABS
5.0000 mg | ORAL_TABLET | Freq: Two times a day (BID) | ORAL | Status: DC
  Administered 2022-07-06 – 2022-07-10 (×8): 5 mg via ORAL
  Filled 2022-07-06 (×8): qty 1

## 2022-07-06 MED ORDER — ACETAMINOPHEN 500 MG PO TABS
1000.0000 mg | ORAL_TABLET | Freq: Four times a day (QID) | ORAL | Status: DC
  Administered 2022-07-06 – 2022-07-10 (×12): 1000 mg via ORAL
  Filled 2022-07-06 (×13): qty 2

## 2022-07-06 MED ORDER — IBUPROFEN 800 MG PO TABS: 800.0000 mg | ORAL_TABLET | Freq: Four times a day (QID) | ORAL | Status: DC | PRN

## 2022-07-06 MED ORDER — CLONAZEPAM 0.5 MG PO TABS
1.0000 mg | ORAL_TABLET | Freq: Two times a day (BID) | ORAL | Status: DC
  Administered 2022-07-06 – 2022-07-10 (×8): 1 mg via ORAL
  Filled 2022-07-06: qty 2
  Filled 2022-07-06 (×2): qty 1
  Filled 2022-07-06: qty 2
  Filled 2022-07-06: qty 1
  Filled 2022-07-06 (×3): qty 2
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
## 2022-07-06 NOTE — Progress Notes (Signed)
Inpatient Rehab Admissions Coordinator:  ? ?Per therapy recommendations,  patient was screened for CIR candidacy by Letisha Yera, MS, CCC-SLP. At this time, Pt. Appears to be a a potential candidate for CIR. I will place   order for rehab consult per protocol for full assessment. Please contact me any with questions. ? ?Phuc Kluttz, MS, CCC-SLP ?Rehab Admissions Coordinator  ?[phone] (celll) ?[phone] (office) ? ?

## 2022-07-06 NOTE — Progress Notes (Addendum)
Pt more awake precedex d/ed at 0739. Patient stable OOB to chair with PT tolerated well. Pt taking po without difficulty.
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
## 2022-07-06 NOTE — Progress Notes (Signed)
Arrived to patient's room and pt requested that VAST return at a latter time for PIV placement and line removal. Notified nurse. Fran Lowes, RN VAST
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
## 2022-07-07 LAB — GLUCOSE, CAPILLARY
Glucose-Capillary: 103 mg/dL — ABNORMAL HIGH (ref 70–99)
Glucose-Capillary: 86 mg/dL (ref 70–99)
Glucose-Capillary: 88 mg/dL (ref 70–99)
Glucose-Capillary: 89 mg/dL (ref 70–99)
Glucose-Capillary: 92 mg/dL (ref 70–99)
Glucose-Capillary: 97 mg/dL (ref 70–99)

## 2022-07-07 MED ORDER — ONDANSETRON HCL 4 MG/2ML IJ SOLN
4.0000 mg | Freq: Once | INTRAMUSCULAR | Status: AC
  Administered 2022-07-07: 4 mg via INTRAVENOUS
  Filled 2022-07-07: qty 2

## 2022-07-07 NOTE — Progress Notes (Signed)
  Progress Note   Date: 07/07/2022  Patient Name: Joseph [name]        MRN#: [id number]   Clarification of diagnosis:   Gram negative pneumonia Gram positive pneumonia    MSSA/Klebsiella Pneumonia ruled in

## 2022-07-07 NOTE — Progress Notes (Signed)
Inpatient Rehab Admissions Coordinator:    I met with pt. To discuss potential CIR admit. He is interested, states he can live with his Geraldo Docker who is local or his mom in [place].  Pt. Is uninsured but agreeable to cost as self-pay Pt.. I will confirm disposition and pursue for admission.   Clemens Catholic, [place], [place] Admissions Coordinator  [phone] ([place]) [phone] (office)
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
## 2022-07-08 NOTE — Progress Notes (Signed)
Orthopedic Tech Progress Note Patient Details:  Joseph [name] 08/27/1996 [id number]  Ortho Devices Type of Ortho Device: Thumb velcro splint Splint Material: Plaster Ortho Device/Splint Location: LUE Ortho Device/Splint Interventions: Ordered, Application, Adjustment   Post Interventions Patient Tolerated: Well Instructions Provided: Care of device  Joseph [name] 07/08/2022, 12:51 PM
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
## 2022-07-08 NOTE — Progress Notes (Signed)
PT Cancellation Note  Patient Details Name: Joseph [name] MRN: [id number] DOB: 1996-08-26   Cancelled Treatment:    Reason Eval/Treat Not Completed: (P) Other (comment) (Attempt in AM but RN defers until after wound vac changed. Attempt again later in day pt working with SLP ~1430. Attempt again and pt c/o fatigue.) Will continue efforts per PT plan of care as schedule permits.   Kara Pacer Nayden Czajka 07/08/2022, 5:55 PM

## 2022-07-09 MED ORDER — SODIUM CHLORIDE 0.9 % IV BOLUS
500.0000 mL | Freq: Once | INTRAVENOUS | Status: AC
  Administered 2022-07-09: 500 mL via INTRAVENOUS
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
## 2022-07-09 NOTE — TOC Progression Note (Signed)
Transition of Care [facility]) - Progression Note    Patient Details  Name: Joseph [name] MRN: [id number] Date of Birth: 06/08/1996  [facility] CM/SW Contact  Ella Bodo, RN Phone Number: 07/09/2022, 3:40pm  Clinical Narrative:    Patient desiring to go home today, and states that he has a friend he can go home with.  Physical therapy will need to work with the patient to determine if he is able to DC.  Addendum: 1640pm PT worked with patient; he is still orthostatic, and lost balance during ambulation.  PT continues to recommend inpatient rehab; appears rehab [facility] is waiting a callback from patient's mother to determine if he can discharge home with her.  Will follow progress.   Expected Discharge Plan: IP Rehab Facility Barriers to Discharge: Continued Medical Work up  Expected Discharge Plan and Services Expected Discharge Plan: [place]   Discharge Planning Services: CM Consult   Living arrangements for the past 2 months: Homeless                                       Social Determinants of Health (SDOH) Interventions    Readmission Risk Interventions     No data to display         Reinaldo Raddle, RN, BSN  Trauma/Neuro ICU Case Manager [phone]
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
## 2022-07-10 ENCOUNTER — Other Ambulatory Visit (HOSPITAL_COMMUNITY): Payer: Self-pay

## 2022-07-10 MED ORDER — FOLIC ACID 1 MG PO TABS: 1.0000 mg | ORAL_TABLET | Freq: Every day | ORAL | Status: DC

## 2022-07-10 MED ORDER — ACETAMINOPHEN 500 MG PO TABS
1000.0000 mg | ORAL_TABLET | Freq: Four times a day (QID) | ORAL | 0 refills | Status: DC | PRN
  Filled 2022-07-10: qty 30, 4d supply, fill #0

## 2022-07-10 MED ORDER — OLANZAPINE 5 MG PO TABS
5.0000 mg | ORAL_TABLET | Freq: Two times a day (BID) | ORAL | 0 refills | Status: DC
  Filled 2022-07-10: qty 60, 30d supply, fill #0

## 2022-07-10 MED ORDER — CLONAZEPAM 0.5 MG PO TABS
0.5000 mg | ORAL_TABLET | Freq: Two times a day (BID) | ORAL | 0 refills | Status: DC | PRN
  Filled 2022-07-10: qty 20, 10d supply, fill #0

## 2022-07-10 MED ORDER — METHOCARBAMOL 500 MG PO TABS
1000.0000 mg | ORAL_TABLET | Freq: Three times a day (TID) | ORAL | 1 refills | Status: AC | PRN
  Filled 2022-07-10: qty 42, 7d supply, fill #0

## 2022-07-10 MED ORDER — ADULT MULTIVITAMIN W/MINERALS CH: 1.0000 | ORAL_TABLET | Freq: Every day | ORAL | Status: DC

## 2022-07-10 MED ORDER — VITAMIN B-1 100 MG PO TABS: 100.0000 mg | ORAL_TABLET | Freq: Every day | ORAL | Status: DC
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
## 2022-07-10 NOTE — TOC Transition Note (Signed)
Transition of Care [facility]) - CM/SW Discharge Note   Patient Details  Name: Joseph [name] MRN: [id number] Date of Birth: 24-Sep-1995  [facility] CM/SW Contact:  Ella Bodo, RN Phone Number: 07/10/2022, 3:52 PM   Clinical Narrative:    Patient is desiring to dc to a friend's house or a shelter today; he has been cleared by PT/OT.  Nurse to provide teaching for wound care and supplies.  Outpatient psych resources have been added to AVS, per provider's request.     Final next level of care: Home/Self Care Barriers to Discharge: Barriers Resolved                       Discharge Plan and Services   Discharge Planning Services: Medication Assistance                                 Social Determinants of Health (SDOH) Interventions     Readmission Risk Interventions     No data to display         Reinaldo Raddle, RN, BSN  Trauma/Neuro ICU Case Manager [phone]

## 2022-07-10 NOTE — Progress Notes (Signed)
Vella Redhead to be D/C'd  per MD order.  Discussed with the patient and all questions fully answered.  VSS, Skin clean, dry and intact without evidence of skin break down, no evidence of skin tears noted.  IV catheter discontinued intact. Site without signs and symptoms of complications. Dressing and pressure applied.  An After Visit Summary was printed and given to the patient. Patient received prescription from [place]. Pt receive supplies for wound care and taxi voucher given.  D/c re-educated completed with patient/family including follow up instructions, medication list, d/c activities limitations if indicated, with other d/c instructions as indicated by MD - patient able to verbalize understanding, all questions fully answered.   Patient instructed to return to ED, call 911, or call MD for any changes in condition.   Patient to be escorted via [place], and D/C home via private auto.

## 2022-07-10 NOTE — Discharge Instructions (Addendum)
Wet to Dry WOUND CARE: - Change dressing twice daily - Supplies: sterile saline, kerlex, scissors, ABD pads, tape  Remove dressing and all packing carefully, moistening with sterile saline as needed to avoid packing/internal dressing sticking to the wound. 2.   Clean edges of skin around the wound with water/gauze, making sure there is no tape debris or leakage left on skin that could cause skin irritation or breakdown. 3.   Dampen and clean kerlex with sterile saline and pack wound from wound base to skin level, making sure to take note of any possible areas of wound tracking, tunneling and packing appropriately. Wound can be packed loosely. Trim kerlex to size if a whole kerlex is not required. 4.   Cover wound with a dry ABD pad and secure with tape.  5.   Write the date/time on the dry dressing/tape to better track when the last dressing change occurred. - apply any skin protectant/powder if recommended by clinician to protect skin/skin folds. - change dressing as needed if leakage occurs, wound gets contaminated, or patient requests to shower. - You may shower daily with wound open and following the shower the wound should be dried and a clean dressing placed.  - Medical grade tape as well as packing supplies can be found at [facility] on [address] or [facility] on [address]. The remaining supplies can be found at your local drug store, [employer] etc.  [facility]      [facility] [phone]  OPEN ABDOMINAL SURGERY: POST OP INSTRUCTIONS  Always review your discharge instruction sheet given to you by the facility where your surgery was performed.  IF YOU HAVE DISABILITY OR FAMILY LEAVE FORMS, YOU MUST BRING THEM TO THE OFFICE FOR PROCESSING.  PLEASE DO NOT GIVE THEM TO YOUR DOCTOR.  A prescription for pain medication may be given to you upon discharge.  Take your pain medication as prescribed, if needed.  If narcotic pain medicine is not needed,  then you may take acetaminophen (Tylenol) or ibuprofen (Advil) as needed. Take your usually prescribed medications unless otherwise directed. If you need a refill on your pain medication, please contact your pharmacy. They will contact our office to request authorization.  Prescriptions will not be filled after 5pm or on week-ends. You should follow a light diet the first few days after arrival home, such as soup and crackers, pudding, etc.unless your doctor has advised otherwise. A high-fiber, low fat diet can be resumed as tolerated.   Be sure to include lots of fluids daily. Most patients will experience some swelling and bruising on the chest and neck area.  Ice packs will help.  Swelling and bruising can take several days to resolve Most patients will experience some swelling and bruising in the area of the incision. Ice pack will help. Swelling and bruising can take several days to resolve..  It is common to experience some constipation if taking pain medication after surgery.  Increasing fluid intake and taking a stool softener will usually help or prevent this problem from occurring.  A mild laxative (Milk of Magnesia or Miralax) should be taken according to package directions if there are no bowel movements after 48 hours.  You may have steri-strips (small skin tapes) in place directly over the incision.  These strips should be left on the skin for 7-10 days.  If your surgeon used skin glue on the incision, you may shower in 24 hours.  The glue will flake off over the   next 2-3 weeks.  Any sutures or staples will be removed at the office during your follow-up visit. You may find that a light gauze bandage over your incision may keep your staples from being rubbed or pulled. You may shower and replace the bandage daily. ACTIVITIES:  You may resume regular (light) daily activities beginning the next day--such as daily self-care, walking, climbing stairs--gradually increasing activities as tolerated.   You may have sexual intercourse when it is comfortable.  Refrain from any heavy lifting or straining until approved by your doctor. You may drive when you no longer are taking prescription pain medication, you can comfortably wear a seatbelt, and you can safely maneuver your car and apply brakes Return to Work: ___________________________________ Joseph [name] Bast should see your doctor in the office for a follow-up appointment approximately two weeks after your surgery.  Make sure that you call for this appointment within a day or two after you arrive home to insure a convenient appointment time. OTHER INSTRUCTIONS:  _____________________________________________________________ _____________________________________________________________  WHEN TO CALL YOUR DOCTOR: Fever over 101.0 Inability to urinate Nausea and/or vomiting Extreme swelling or bruising Continued bleeding from incision. Increased pain, redness, or drainage from the incision. Difficulty swallowing or breathing Muscle cramping or spasms. Numbness or tingling in hands or feet or around lips.  The clinic staff is available to answer your questions during regular business hours.  Please don't hesitate to call and ask to speak to one of the nurses if you have concerns.  For further questions, please visit [email or web address]  PNEUMOTHORAX OR HEMOTHORAX +/- RIB FRACTURES  HOME INSTRUCTIONS   PAIN CONTROL:  Pain is best controlled by a usual combination of three different methods TOGETHER:  Ice/Heat Over the counter pain medication Prescription pain medication You may experience some swelling and bruising in area of broken ribs. Ice packs or heating pads (30-60 minutes up to 6 times a day) will help. Use ice for the first few days to help decrease swelling and bruising, then switch to heat to help relax tight/sore spots and speed recovery. Some people prefer to use ice alone, heat alone, alternating between ice & heat.  Experiment to what works for you. Swelling and bruising can take several weeks to resolve.  It is helpful to take an over-the-counter pain medication regularly for the first few weeks. Choose one of the following that works best for you:  Naproxen (Aleve, etc) Two 220mg  tabs twice a day Ibuprofen (Advil, etc) Three 200mg  tabs four times a day (every meal & bedtime) Acetaminophen (Tylenol, etc) 500-650mg  four times a day (every meal & bedtime) A prescription for pain medication (such as oxycodone, hydrocodone, etc) may be given to you upon discharge. Take your pain medication as prescribed.  If you are having problems/concerns with the prescription medicine (does not control pain, nausea, vomiting, rash, itching, etc), please call us [phone] to see if we need to switch you to a different pain medicine that will work better for you and/or control your side effect better. If you need a refill on your pain medication, please contact your pharmacy. They will contact our office to request authorization. Prescriptions will not be filled after 5 pm or on week-ends. Avoid getting constipated. When taking pain medications, it is common to experience some constipation. Increasing fluid intake and taking a fiber supplement (such as Metamucil, Citrucel, FiberCon, MiraLax, etc) 1-2 times a day regularly will usually help prevent this problem from occurring. A mild laxative (prune juice, Milk of Magnesia, MiraLax, etc) should  be taken according to package directions if there are no bowel movements after 48 hours.  Watch out for diarrhea. If you have many loose bowel movements, simplify your diet to bland foods & liquids for a few days. Stop any stool softeners and decrease your fiber supplement. Switching to mild anti-diarrheal medications (Kayopectate, Pepto Bismol) can help. If this worsens or does not improve, please call us. Chest tube site wound: you may remove the dressing from your chest tube site 3 days  after the removal of your chest tube. DO NOT shower over the dressing. Once   removed, you may shower as normal. Do not submerge your wound in water for 2-3 weeks.  FOLLOW UP  Please call our office to set up or confirm an appointment for follow up for 2 weeks after discharge. You will need to get a chest xray at either [facility] or [facility]. This will be outlined in your follow up instructions. Please call [facility] at [phone] if you have any questions about follow up.  If you have any orthopedic or other injuries you will need to follow up as outlined in your follow up instructions.   WHEN TO CALL us [phone]:  Poor pain control Reactions / problems with new medications (rash/itching, nausea, etc)  Fever over 101.5 F (38.5 C) Worsening swelling or bruising Redness, drainage, pain or swelling around chest tube site Worsening pain, productive cough, difficulty breathing or any other concerning symptoms  The clinic staff is available to answer your questions during regular business hours (8:30am-5pm). Please don't hesitate to call and ask to speak to one of our nurses for clinical concerns.  If you have a medical emergency, go to the nearest emergency room or call 911.  A surgeon from [facility] is always on call at the [facility], [place], [place], [place], [place] [address] ?  MAIN: [phone] ? TOLL FREE: [phone] ?  FAX [phone]  [email or web address]      Information on Rib Fractures  A rib fracture is a break or crack in one of the bones of the ribs. The ribs are long, curved bones that wrap around your chest and attach to your spine and your breastbone. The ribs protect your heart, lungs, and other organs in the chest. A broken or cracked rib is often painful but is not usually serious. Most rib fractures heal on their own over time. However, rib fractures can be more serious  if multiple ribs are broken or if broken ribs move out of place and push against other structures or organs. What are the causes? This condition is caused by: Repetitive movements with high force, such as pitching a baseball or having severe coughing spells. A direct blow to the chest, such as a sports injury, a car accident, or a fall. Cancer that has spread to the bones, which can weaken bones and cause them to break. What are the signs or symptoms? Symptoms of this condition include: Pain when you breathe in or cough. Pain when someone presses on the injured area. Feeling short of breath. How is this diagnosed? This condition is diagnosed with a physical exam and medical history. Imaging tests may also be done, such as: Chest X-ray. CT scan. MRI. Bone scan. Chest ultrasound. How is this treated? Treatment for this condition depends on the severity of the fracture. Most rib fractures usually heal on their own in 1-3 months. Sometimes healing  takes longer if there is a cough that does not stop or if there are other activities that make the injury worse (aggravating factors). While you heal, you will be given medicines to control the pain. You will also be taught deep breathing exercises. Severe injuries may require hospitalization or surgery. Follow these instructions at home: Managing pain, stiffness, and swelling If directed, apply ice to the injured area. Put ice in a plastic bag. Place a towel between your skin and the bag. Leave the ice on for 20 minutes, 2-3 times a day. Take over-the-counter and prescription medicines only as told by your health care provider. Activity Avoid a lot of activity and any activities or movements that cause pain. Be careful during activities and avoid bumping the injured rib. Slowly increase your activity as told by your health care provider. General instructions Do deep breathing exercises as told by your health care provider. This helps prevent  pneumonia, which is a common complication of a broken rib. Your health care provider may instruct you to: Take deep breaths several times a day. Try to cough several times a day, holding a pillow against the injured area. Use a device called incentive spirometer to practice deep breathing several times a day. Drink enough fluid to keep your urine pale yellow. Do not wear a rib belt or binder. These restrict breathing, which can lead to pneumonia. Keep all follow-up visits as told by your health care provider. This is important. Contact a health care provider if: You have a fever. Get help right away if: You have difficulty breathing or you are short of breath. You develop a cough that does not stop, or you cough up thick or bloody sputum. You have nausea, vomiting, or pain in your abdomen. Your pain gets worse and medicine does not help. Summary A rib fracture is a break or crack in one of the bones of the ribs. A broken or cracked rib is often painful but is not usually serious. Most rib fractures heal on their own over time. Treatment for this condition depends on the severity of the fracture. Avoid a lot of activity and any activities or movements that cause pain. This information is not intended to replace advice given to you by your health care provider. Make sure you discuss any questions you have with your health care provider. Document Released: 08/31/2005 Document Revised: 11/30/2016 Document Reviewed: 11/30/2016 Elsevier Interactive Patient Education  2019 [employer] Inc[employer]    Pneumothorax A pneumothorax is commonly called a collapsed lung. It is a condition in which air leaks from a lung and builds up between the thin layer of tissue that covers the lungs (visceral pleura) and the interior wall of the chest cavity (parietal pleura). The air gets trapped outside the lung, between the lung and the chest wall (pleural space). The air takes up space and prevents the lung from fully  expanding. This condition sometimes occurs suddenly with no apparent cause. The buildup of air may be small or large. A small pneumothorax may go away on its own. A large pneumothorax will require treatment and hospitalization. What are the causes? This condition may be caused by: Trauma and injury to the chest wall. Surgery and other medical procedures. A complication of an underlying lung problem, especially chronic obstructive pulmonary disease (COPD) or emphysema. Sometimes the cause of this condition is not known. What increases the risk? You are more likely to develop this condition if: You have an underlying lung problem. You smoke. You  are 54-23 years old, male, tall, and underweight. You have a personal or family history of pneumothorax. You have an eating disorder (anorexia nervosa). This condition can also happen quickly, even in people with no history of lung problems. What are the signs or symptoms? Sometimes a pneumothorax will have no symptoms. When symptoms are present, they can include: Chest pain. Shortness of breath. Increased rate of breathing. Bluish color to your lips or skin (cyanosis). How is this diagnosed? This condition may be diagnosed by: A medical history and physical exam. A chest X-ray, chest CT scan, or ultrasound. How is this treated? Treatment depends on how severe your condition is. The goal of treatment is to remove the extra air and allow your lung to expand back to its normal size. For a small pneumothorax: No treatment may be needed. Extra oxygen is sometimes used to make it go away more quickly. For a large pneumothorax or a pneumothorax that is causing symptoms, a procedure is done to drain the air from your lungs. To do this, a health care provider may use: A needle with a syringe. This is used to suck air from a pleural space where no additional leakage is taking place. A chest tube. This is used to suck air where there is ongoing leakage  into the pleural space. The chest tube may need to remain in place for several days until the air leak has healed. In more severe cases, surgery may be needed to repair the damage that is causing the leak. If you have multiple pneumothorax episodes or have an air leak that will not heal, a procedure called a pleurodesis may be done. A medicine is placed in the pleural space to irritate the tissues around the lung so that the lung will stick to the chest wall, seal any leaks, and stop any buildup of air in that space. If you have an underlying lung problem, severe symptoms, or a large pneumothorax you will usually need to stay in the hospital. Follow these instructions at home: Lifestyle Do not use any products that contain nicotine or tobacco, such as cigarettes and e-cigarettes. These are major risk factors in pneumothorax. If you need help quitting, ask your health care provider. Do not lift anything that is heavier than 10 lb (4.5 kg), or the limit that your health care provider tells you, until he or she says that it is safe. Avoid activities that take a lot of effort (strenuous) for as long as told by your health care provider. Return to your normal activities as told by your health care provider. Ask your health care provider what activities are safe for you. Do not fly in an airplane or scuba dive until your health care provider says it is okay. General instructions Take over-the-counter and prescription medicines only as told by your health care provider. If a cough or pain makes it difficult for you to sleep at night, try sleeping in a semi-upright position in a recliner or by using 2 or 3 pillows. If you had a chest tube and it was removed, ask your health care provider when you can remove the bandage (dressing). While the dressing is in place, do not allow it to get wet. Keep all follow-up visits as told by your health care provider. This is important. Contact a health care provider  if: You cough up thick mucus (sputum) that is yellow or green in color. You were treated with a chest tube, and you have redness, increasing pain, or  discharge at the site where it was placed. Get help right away if: You have increasing chest pain or shortness of breath. You have a cough that will not go away. You begin coughing up blood. You have pain that is getting worse or is not controlled with medicines. The site where your chest tube was located opens up. You feel air coming out of the site where the chest tube was placed. You have a fever or persistent symptoms for more than 2-3 days. You have a fever and your symptoms suddenly get worse. These symptoms may represent a serious problem that is an emergency. Do not wait to see if the symptoms will go away. Get medical help right away. Call your local emergency services (911 in the [place] Do not drive yourself to the hospital. Summary A pneumothorax, commonly called a collapsed lung, is a condition in which air leaks from a lung and gets trapped between the lung and the chest wall (pleural space). The buildup of air may be small or large. A small pneumothorax may go away on its own. A large pneumothorax will require treatment and hospitalization. Treatment for this condition depends on how severe the pneumothorax is. The goal of treatment is to remove the extra air and allow the lung to expand back to its normal size. This information is not intended to replace advice given to you by your health care provider. Make sure you discuss any questions you have with your health care provider. Document Released: 08/31/2005 Document Revised: 08/09/2017 Document Reviewed: 08/09/2017 Elsevier Interactive Patient Education  2019 [place].          Outpatient Psychiatry and Counseling  Therapeutic Alternatives: Mobile Crisis Management:  [facility] [employer])         [address] [place], [place] [address] [phone]  [place] sliding scale fee and walk in schedule: M-F 8am-12pm/1pm-3pm [place], [place] [address] [facility] Intensive [facility] Program [place], [place] [address] [phone]  [facility]   [facility] [address]   [address], [place], [place] [address]    [place], [place] [address] [id number]     [phone]  [facility] [address]  [place] [address]    [place] [address] [phone]     [place], [place] [address]    [place] [address]    [place] [address] [phone]       [phone]  [facility]   [facility] [address] [place]     [place], [place]     [place] [phone] E. [place], MD [place], [place] [address] [place] [phone]     [place], [place] [address] [phone] [facility]: ([place]) [phone]  [facility]    [facility] [address] [phone]  [place], [place] [address]    [place], [place] [address] [phone]     [phone]

## 2022-07-12 ENCOUNTER — Emergency Department (HOSPITAL_COMMUNITY): Payer: Self-pay

## 2022-07-12 ENCOUNTER — Encounter (HOSPITAL_COMMUNITY): Payer: Self-pay

## 2022-07-12 ENCOUNTER — Other Ambulatory Visit: Payer: Self-pay

## 2022-07-12 ENCOUNTER — Inpatient Hospital Stay (HOSPITAL_COMMUNITY)
Admission: EM | Admit: 2022-07-12 | Discharge: 2022-07-16 | DRG: 175 | Disposition: A | Discharge status: Home / Self Care | Payer: Self-pay | Attending: Surgery | Admitting: Surgery

## 2022-07-12 DIAGNOSIS — W260XXD Contact with knife, subsequent encounter: Secondary | ICD-10-CM | POA: Diagnosis present

## 2022-07-12 DIAGNOSIS — I2699 Other pulmonary embolism without acute cor pulmonale: Principal | ICD-10-CM | POA: Diagnosis present

## 2022-07-12 DIAGNOSIS — S3693XD Laceration of unspecified intra-abdominal organ, subsequent encounter: Secondary | ICD-10-CM

## 2022-07-12 DIAGNOSIS — J189 Pneumonia, unspecified organism: Secondary | ICD-10-CM | POA: Diagnosis present

## 2022-07-12 DIAGNOSIS — Z5900 Homelessness unspecified: Secondary | ICD-10-CM

## 2022-07-12 DIAGNOSIS — Y95 Nosocomial condition: Secondary | ICD-10-CM | POA: Diagnosis present

## 2022-07-12 LAB — COMPREHENSIVE METABOLIC PANEL
ALT: 26 U/L (ref 0–44)
AST: 24 U/L (ref 15–41)
Albumin: 2.9 g/dL — ABNORMAL LOW (ref 3.5–5.0)
Alkaline Phosphatase: 121 U/L (ref 38–126)
Anion gap: 13 (ref 5–15)
BUN: 14 mg/dL (ref 6–20)
CO2: 22 mmol/L (ref 22–32)
Calcium: 9.3 mg/dL (ref 8.9–10.3)
Chloride: 104 mmol/L (ref 98–111)
Creatinine, Ser: 0.92 mg/dL (ref 0.61–1.24)
GFR, Estimated: >60 mL/min (ref >60)
Glucose, Bld: 90 mg/dL (ref 70–99)
Potassium: 3.8 mmol/L (ref 3.5–5.1)
Sodium: 139 mmol/L (ref 135–145)
Total Bilirubin: 0.6 mg/dL (ref 0.3–1.2)
Total Protein: 7.9 g/dL (ref 6.5–8.1)

## 2022-07-12 LAB — CBC WITH DIFFERENTIAL/PLATELET
Abs Immature Granulocytes: 0.04 10*3/uL (ref 0.00–0.07)
Basophils Absolute: 0.2 10*3/uL — ABNORMAL HIGH (ref 0.0–0.1)
Basophils Relative: 2 %
Eosinophils Absolute: 0.1 10*3/uL (ref 0.0–0.5)
Eosinophils Relative: 1 %
HCT: 33.9 % — ABNORMAL LOW (ref 39.0–52.0)
Hemoglobin: 11 g/dL — ABNORMAL LOW (ref 13.0–17.0)
Immature Granulocytes: 0 %
Lymphocytes Relative: 22 %
Lymphs Abs: 2.1 10*3/uL (ref 0.7–4.0)
MCH: 29.4 pg (ref 26.0–34.0)
MCHC: 32.4 g/dL (ref 30.0–36.0)
MCV: 90.6 fL (ref 80.0–100.0)
Monocytes Absolute: 1 10*3/uL (ref 0.1–1.0)
Monocytes Relative: 10 %
Neutro Abs: 6.3 10*3/uL (ref 1.7–7.7)
Neutrophils Relative %: 65 %
Platelets: 879 10*3/uL — ABNORMAL HIGH (ref 150–400)
RBC: 3.74 MIL/uL — ABNORMAL LOW (ref 4.22–5.81)
RDW: 14.7 % (ref 11.5–15.5)
WBC: 9.7 10*3/uL (ref 4.0–10.5)
nRBC: 0 % (ref 0.0–0.2)

## 2022-07-12 LAB — URINALYSIS, ROUTINE W REFLEX MICROSCOPIC
Bilirubin Urine: NEGATIVE
Glucose, UA: NEGATIVE mg/dL
Hgb urine dipstick: NEGATIVE
Ketones, ur: 15 mg/dL — AB
Leukocytes,Ua: NEGATIVE
Nitrite: NEGATIVE
Protein, ur: NEGATIVE mg/dL
Specific Gravity, Urine: >1.03 — ABNORMAL HIGH (ref 1.005–1.030)
pH: 6 (ref 5.0–8.0)

## 2022-07-12 LAB — LACTIC ACID, PLASMA: Lactic Acid, Venous: 1 mmol/L (ref 0.5–1.9)

## 2022-07-12 MED ORDER — SODIUM CHLORIDE 0.9 % IV SOLN
2.0000 g | Freq: Once | INTRAVENOUS | Status: AC
  Administered 2022-07-12: 2 g via INTRAVENOUS
  Filled 2022-07-12: qty 12.5

## 2022-07-12 MED ORDER — SODIUM CHLORIDE 0.9 % IV BOLUS
1000.0000 mL | Freq: Once | INTRAVENOUS | Status: AC
  Administered 2022-07-12: 1000 mL via INTRAVENOUS

## 2022-07-12 MED ORDER — HEPARIN (PORCINE) 25000 UT/250ML-% IV SOLN: 1600.0000 [IU]/h | INTRAVENOUS | Status: DC

## 2022-07-12 MED ORDER — RIVAROXABAN 15 MG PO TABS
15.0000 mg | ORAL_TABLET | Freq: Two times a day (BID) | ORAL | Status: DC
  Filled 2022-07-12: qty 1

## 2022-07-12 MED ORDER — HEPARIN BOLUS VIA INFUSION
5800.0000 [IU] | Freq: Once | INTRAVENOUS | Status: DC
  Filled 2022-07-12: qty 5800

## 2022-07-12 MED ORDER — RIVAROXABAN 15 MG PO TABS
15.0000 mg | ORAL_TABLET | Freq: Two times a day (BID) | ORAL | Status: DC
  Administered 2022-07-12 – 2022-07-16 (×8): 15 mg via ORAL
  Filled 2022-07-12 (×8): qty 1

## 2022-07-12 MED ORDER — RIVAROXABAN 20 MG PO TABS: 20.0000 mg | ORAL_TABLET | Freq: Every day | ORAL | Status: DC

## 2022-07-12 MED ORDER — VANCOMYCIN HCL IN DEXTROSE 1-5 GM/200ML-% IV SOLN: 1000.0000 mg | Freq: Once | INTRAVENOUS | Status: DC

## 2022-07-12 MED ORDER — VANCOMYCIN HCL IN DEXTROSE 1-5 GM/200ML-% IV SOLN
1000.0000 mg | Freq: Three times a day (TID) | INTRAVENOUS | Status: DC
  Administered 2022-07-13 (×2): 1000 mg via INTRAVENOUS
  Filled 2022-07-12 (×2): qty 200

## 2022-07-12 MED ORDER — VANCOMYCIN HCL 1500 MG/300ML IV SOLN
1500.0000 mg | Freq: Once | INTRAVENOUS | Status: AC
  Administered 2022-07-12: 1500 mg via INTRAVENOUS
  Filled 2022-07-12: qty 300

## 2022-07-12 MED ORDER — SODIUM CHLORIDE 0.9 % IV SOLN: 1.0000 g | Freq: Once | INTRAVENOUS | Status: DC

## 2022-07-12 MED ORDER — IOHEXOL 350 MG/ML SOLN
75.0000 mL | Freq: Once | INTRAVENOUS | Status: AC | PRN
  Administered 2022-07-12: 75 mL via INTRAVENOUS
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
## 2022-07-12 NOTE — Progress Notes (Signed)
Pharmacy Antibiotic Note  Joseph [name] is a 26 y.o. male admitted on 07/12/2022 presenting with concern for pna with recent hospital admission.  Pharmacy has been consulted for vancomycin dosing.  Broad spectrum coverage per MD  Plan: Vancomycin 1500 mg IV x 1, then 1g IV q 8h (eAUC 509) Add MRSA PCR Monitor renal function, Cx/PCR to narrow Vancomycin levels as needed     Temp (24hrs), Avg:99 F (37.2 C), Min:99 F (37.2 C), Max:99 F (37.2 C)  Recent Labs  Lab 07/12/22 1030  WBC 9.7  CREATININE 0.92  LATICACIDVEN 1.0    CrCl cannot be calculated (Unknown ideal weight.).    Not on File  Joseph [name], PharmD Clinical Pharmacist ED Pharmacist Phone # [phone] 07/12/2022 3:18 PM

## 2022-07-12 NOTE — ED Notes (Signed)
Pt up walking around  he will not stay hooked up to the heart monitor
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
## 2022-07-12 NOTE — Discharge Instructions (Signed)
[facility] ([facility]) Monday - Friday 8am - 3pm          Sat & Sun 8am - 2pm [address] [place], [place] [address]   [phone]     www.[email or web address] IRC offers among other critical resources: showers, laundry, barbershop, phone bank, mailroom, computer lab, medical clinic, gardens and a bike maintenance area.   AREA SHELTERS  [facility]  (Men & women) [address] [place] [phone]  [facility] (Men/women/families) [address] [place] [phone]   [facility] (Families with children) [address]  [place] [phone]   [facility] (Domestic Violence Shelter) [address]  [place] [phone]   [facility] (Children ages 7-17) [address]  [place] [phone]   [facility]   (Women & children) [address] [place] [phone]   [facility] (Women/substance abuse) [address]  [place] [phone]   [facility] (Men) [address]  [place] [phone]   [facility] (Men) [address]  [place] [phone]  [facility] (Women) [address]  [place] [phone]   [facility] (Single women & women with children) [address]  [place] [phone]  [facility] (Men/women/families) [address]  [place] [phone]    [facility]   (Domestic Violence shelter) [address]  [place] [phone]   [facility] (Men & women) [address]  [place] [phone]  [facility] (Men) [address]  [place] [phone]   [place] (Men) [address]  [place] [phone]   [facility] (Single women & families) [address]  [place] [phone]  [facility] (Men/women & families) [address]   [place] [phone]    If you are at risk of losing your housing (throughout [place]) call the Housing Hotline at (336)691-9521. You may also contact 2-1-1, a FREE service of the [employer] that provides information about many resources including housing. Dial 211, or visit online at www.nc211.org. [place]:  [facility], contact Steve Adams [phone] (men only)                          [facility] in [place]:  90 day homeless program for women and men;                             contact Rev. Chambers [phone]  [place]:  [facility]:  men/women/children [phone]  [place]:  Shelter in [place], Pastor Kivett [phone]                       [facility] in [place], Santiago Lopez [phone]   [place]:  [facility], 910-572-3749; (women and children)  [place]:  [facility], men/women/children; [phone]                           [facility] in [place], [phone]; substance abuse halfway house for men              [facility]; 4 bedroom house in [place] for homeless women, contact Elaine Owens [phone]               [facility]   in [place], Susan Bellow, [phone] (women and children)               [facility], for abused women and children, 24 hour crisis line, 910-947-3333, Anne Friesen  [facility], halfway house for women, [place], [phone]  [place]:  [facility], [phone]; open Mon-Thurs from Jan14 - March 15 when temp is below 32 degrees                              [facility]; Pastor Jeff Looney, [phone]; cell [phone]; open 24/7  [place]:  [facility] - Rev Taylor - [phone]  [place]:  transitional housing for women and children; Sabrina Hough [phone]  
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
## 2022-07-13 ENCOUNTER — Other Ambulatory Visit (HOSPITAL_COMMUNITY): Payer: Self-pay

## 2022-07-13 MED ORDER — METHOCARBAMOL 500 MG PO TABS
500.0000 mg | ORAL_TABLET | Freq: Four times a day (QID) | ORAL | Status: DC | PRN
  Administered 2022-07-15: 500 mg via ORAL
  Filled 2022-07-13: qty 1

## 2022-07-13 MED ORDER — SODIUM CHLORIDE 0.9 % IV SOLN
2.0000 g | Freq: Three times a day (TID) | INTRAVENOUS | Status: DC
  Administered 2022-07-13 – 2022-07-16 (×10): 2 g via INTRAVENOUS
  Filled 2022-07-13 (×11): qty 12.5

## 2022-07-13 MED ORDER — THIAMINE MONONITRATE 100 MG PO TABS
100.0000 mg | ORAL_TABLET | Freq: Every day | ORAL | Status: DC
  Administered 2022-07-13 – 2022-07-16 (×4): 100 mg via ORAL
  Filled 2022-07-13 (×4): qty 1

## 2022-07-13 MED ORDER — ADULT MULTIVITAMIN W/MINERALS CH
1.0000 | ORAL_TABLET | Freq: Every day | ORAL | Status: DC
  Administered 2022-07-13 – 2022-07-16 (×4): 1 via ORAL
  Filled 2022-07-13 (×4): qty 1

## 2022-07-13 MED ORDER — MUPIROCIN 2 % EX OINT
TOPICAL_OINTMENT | Freq: Two times a day (BID) | CUTANEOUS | Status: DC
  Filled 2022-07-13 (×2): qty 22

## 2022-07-13 MED ORDER — ACETAMINOPHEN 325 MG PO TABS
650.0000 mg | ORAL_TABLET | Freq: Four times a day (QID) | ORAL | Status: DC | PRN
  Administered 2022-07-15: 650 mg via ORAL
  Filled 2022-07-13: qty 2

## 2022-07-13 MED ORDER — FOLIC ACID 1 MG PO TABS
1.0000 mg | ORAL_TABLET | Freq: Every day | ORAL | Status: DC
  Administered 2022-07-13 – 2022-07-16 (×4): 1 mg via ORAL
  Filled 2022-07-13 (×4): qty 1

## 2022-07-13 MED ORDER — OLANZAPINE 5 MG PO TABS
5.0000 mg | ORAL_TABLET | Freq: Two times a day (BID) | ORAL | Status: DC
  Administered 2022-07-13 – 2022-07-16 (×7): 5 mg via ORAL
  Filled 2022-07-13 (×7): qty 1

## 2022-07-13 MED ORDER — CLONAZEPAM 0.5 MG PO TABS
0.5000 mg | ORAL_TABLET | Freq: Two times a day (BID) | ORAL | Status: DC | PRN
  Administered 2022-07-15: 0.5 mg via ORAL
  Filled 2022-07-13: qty 1

## 2022-07-13 NOTE — Progress Notes (Signed)
Transition of Care [facility]) - CAGE-AID Screening   Patient Details  Name: Joseph [name] MRN: [id number] Date of Birth: Jul 05, 1996   Elvina Sidle, RN Trauma Response Nurse Phone Number:  [phone] 07/13/2022, 3:57 PM   Clinical Narrative:  Pt was discharged from the hospital and trauma services on Friday- returned with shortness of breath yesterday.   CAGE-AID Screening:    Have You Ever Felt You Ought to Cut Down on Your Drinking or Drug Use?: No Have People Annoyed You By Critizing Your Drinking Or Drug Use?: No Have You Felt Bad Or Guilty About Your Drinking Or Drug Use?: No Have You Ever Had a Drink or Used Drugs First Thing In The Morning to Steady Your Nerves or to Get Rid of a Hangover?: No CAGE-AID Score: 0  Substance Abuse Education Offered: No (states that he drinks 4-16ounces beers a day-- but has never had withdrawal symptoms-- denies any information re: etoh)
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
## 2022-07-13 NOTE — Progress Notes (Signed)
Patient is a transfer from  ED,alert and oriented,no c/o of pain ,patient made comfortable in room, tele on,assessment done,will continue to monitor.
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
## 2022-07-14 NOTE — Progress Notes (Signed)
  Inpatient Rehab Admissions Coordinator :  Per therapy recommendations patient was screened for CIR candidacy by Danne Baxter RN MSN. Patient is not in need of CIR level rehab, supervision level with IV pole with therapy. No OT f/u recommended.  Danne Baxter RN MSN Admissions Coordinator [phone]
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
## 2022-07-14 NOTE — Progress Notes (Signed)
PT Cancellation Note  Patient Details Name: Joseph [name] MRN: [id number] DOB: 15-May-1996   Cancelled Treatment:    Reason Eval/Treat Not Completed: Patient declined, no reason specified.  Pt is declining PT currently and will retry at another time.   Ramond Dial 07/14/2022, 10:31 AM  Mee Hives, PT PhD Acute Rehab Dept. Number: [place] [facility] [place]
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
## 2022-07-15 NOTE — Progress Notes (Signed)
Pt has concern about him been discharge before next week due to him having a court date. Nurse explain that he needed to be able to change his own dressing safety and that he can tolerate the medication regimen. Pt stated he understood the plan.

## 2022-07-15 NOTE — Progress Notes (Signed)
Spoke with pt about doing his dressing change and he stated" that the night RN show how to  do it", explain to pt that he needs to be able to show [place] that he can change the dressing comfortable on his own. He verbalize that he understood and that I will let the night RN know that he needs to change dressing.

## 2022-07-16 ENCOUNTER — Other Ambulatory Visit (HOSPITAL_COMMUNITY): Payer: Self-pay

## 2022-07-16 ENCOUNTER — Inpatient Hospital Stay (HOSPITAL_COMMUNITY): Payer: Self-pay

## 2022-07-16 LAB — CBC
HCT: 36.5 % — ABNORMAL LOW (ref 39.0–52.0)
Hemoglobin: 12.1 g/dL — ABNORMAL LOW (ref 13.0–17.0)
MCH: 29.2 pg (ref 26.0–34.0)
MCHC: 33.2 g/dL (ref 30.0–36.0)
MCV: 88 fL (ref 80.0–100.0)
Platelets: 604 10*3/uL — ABNORMAL HIGH (ref 150–400)
RBC: 4.15 MIL/uL — ABNORMAL LOW (ref 4.22–5.81)
RDW: 14.2 % (ref 11.5–15.5)
WBC: 10.5 10*3/uL (ref 4.0–10.5)
nRBC: 0 % (ref 0.0–0.2)

## 2022-07-16 LAB — BASIC METABOLIC PANEL
Anion gap: 8 (ref 5–15)
BUN: 10 mg/dL (ref 6–20)
CO2: 24 mmol/L (ref 22–32)
Calcium: 9.4 mg/dL (ref 8.9–10.3)
Chloride: 104 mmol/L (ref 98–111)
Creatinine, Ser: 0.88 mg/dL (ref 0.61–1.24)
GFR, Estimated: >60 mL/min (ref >60)
Glucose, Bld: 117 mg/dL — ABNORMAL HIGH (ref 70–99)
Potassium: 4.2 mmol/L (ref 3.5–5.1)
Sodium: 136 mmol/L (ref 135–145)

## 2022-07-16 LAB — HIV ANTIBODY (ROUTINE TESTING W REFLEX): HIV Screen 4th Generation wRfx: NONREACTIVE

## 2022-07-16 MED ORDER — ONDANSETRON 4 MG PO TBDP: 4.0000 mg | ORAL_TABLET | Freq: Four times a day (QID) | ORAL | Status: DC | PRN

## 2022-07-16 MED ORDER — MELATONIN 3 MG PO TABS: 3.0000 mg | ORAL_TABLET | Freq: Every evening | ORAL | Status: DC | PRN

## 2022-07-16 MED ORDER — OLANZAPINE 5 MG PO TABS: 5.0000 mg | ORAL_TABLET | Freq: Two times a day (BID) | ORAL | Status: DC

## 2022-07-16 MED ORDER — AMOXICILLIN-POT CLAVULANATE 875-125 MG PO TABS
1.0000 | ORAL_TABLET | Freq: Two times a day (BID) | ORAL | 0 refills | Status: AC
  Filled 2022-07-16: qty 6, 3d supply, fill #0

## 2022-07-16 MED ORDER — FOLIC ACID 1 MG PO TABS: 1.0000 mg | ORAL_TABLET | Freq: Every day | ORAL | Status: DC

## 2022-07-16 MED ORDER — METHOCARBAMOL 1000 MG/10ML IJ SOLN: 500.0000 mg | Freq: Three times a day (TID) | INTRAVENOUS | Status: DC | PRN

## 2022-07-16 MED ORDER — VITAMIN B-1 100 MG PO TABS: 100.0000 mg | ORAL_TABLET | Freq: Every day | ORAL | Status: DC

## 2022-07-16 MED ORDER — METHOCARBAMOL 500 MG PO TABS: 500.0000 mg | ORAL_TABLET | Freq: Three times a day (TID) | ORAL | Status: DC | PRN

## 2022-07-16 MED ORDER — ONDANSETRON HCL 4 MG/2ML IJ SOLN: 4.0000 mg | Freq: Four times a day (QID) | INTRAMUSCULAR | Status: DC | PRN

## 2022-07-16 MED ORDER — MUPIROCIN 2 % EX OINT
TOPICAL_OINTMENT | Freq: Two times a day (BID) | CUTANEOUS | 0 refills | Status: DC
  Filled 2022-07-16: qty 22, 14d supply, fill #0

## 2022-07-16 MED ORDER — ADULT MULTIVITAMIN W/MINERALS CH: 1.0000 | ORAL_TABLET | Freq: Every day | ORAL | Status: DC

## 2022-07-16 MED ORDER — BISACODYL 10 MG RE SUPP: 10.0000 mg | Freq: Every day | RECTAL | Status: DC | PRN

## 2022-07-16 MED ORDER — OXYCODONE HCL 5 MG PO TABS: 5.0000 mg | ORAL_TABLET | ORAL | Status: DC | PRN

## 2022-07-16 MED ORDER — ACETAMINOPHEN 325 MG PO TABS: 650.0000 mg | ORAL_TABLET | Freq: Four times a day (QID) | ORAL | Status: DC | PRN

## 2022-07-16 MED ORDER — RIVAROXABAN (XARELTO) VTE STARTER PACK (15 & 20 MG)
15.0000 mg | ORAL_TABLET | Freq: Two times a day (BID) | ORAL | 0 refills | Status: DC
  Filled 2022-07-16: qty 51, 30d supply, fill #0

## 2022-07-16 MED ORDER — DOCUSATE SODIUM 100 MG PO CAPS: 100.0000 mg | ORAL_CAPSULE | Freq: Two times a day (BID) | ORAL | Status: DC

## 2022-07-16 MED ORDER — MORPHINE SULFATE (PF) 2 MG/ML IV SOLN: 1.0000 mg | INTRAVENOUS | Status: DC | PRN

## 2022-07-16 MED ORDER — ACETAMINOPHEN 325 MG PO TABS: 650.0000 mg | ORAL_TABLET | ORAL | Status: DC | PRN
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
## 2022-07-16 NOTE — Progress Notes (Signed)
Mobility Specialist - Progress Note   07/16/22 1100  Mobility  Activity Ambulated independently in hallway  Level of Assistance Independent  Assistive Device None  Distance Ambulated (ft) 100 ft  Activity Response Tolerated well  $Mobility charge 1 Mobility    Pt received in bed agreeable to mobility. Left in bed w/ call bell in reach and all needs met.   Paulla Dolly Mobility Specialist

## 2022-07-16 NOTE — TOC Progression Note (Signed)
Transition of Care [facility]) - Progression Note    Patient Details  Name: Joseph [name] MRN: [id number] Date of Birth: October 29, 1995  Transition of Care [facility]) CM/SW Contact  Jacalyn Lefevre Edson Snowball, RN Phone Number: 07/16/2022, 10:22 AM  Clinical Narrative:     Patient homeless has shelter list.   Provided 3 bus passes.   Entered in [place] will bring medications to bedside.   Nursing provided wound education, ask to provide patient with some dressing supplies.   Follow up appointment at [facility] August 12, 2022 at 2:50 pm   Expected Discharge Plan: Home/Self Care Barriers to Discharge: No Barriers Identified  Expected Discharge Plan and Services Expected Discharge Plan: Home/Self Care In-house Referral: Financial Counselor Discharge Planning Services: CM Consult   Living arrangements for the past 2 months: Homeless Shelter                 DME Arranged: N/A                     Social Determinants of Health (SDOH) Interventions    Readmission Risk Interventions     No data to display
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
## 2022-07-17 ENCOUNTER — Encounter (HOSPITAL_COMMUNITY): Payer: Self-pay

## 2022-07-17 ENCOUNTER — Other Ambulatory Visit (HOSPITAL_COMMUNITY): Payer: Self-pay

## 2022-07-20 ENCOUNTER — Other Ambulatory Visit (HOSPITAL_COMMUNITY): Payer: Self-pay

## 2022-07-22 ENCOUNTER — Other Ambulatory Visit (HOSPITAL_COMMUNITY): Payer: Self-pay

## 2022-07-22 ENCOUNTER — Telehealth (HOSPITAL_COMMUNITY): Payer: Self-pay

## 2022-07-22 NOTE — Telephone Encounter (Signed)
Opened in error

## 2022-07-27 ENCOUNTER — Emergency Department (HOSPITAL_COMMUNITY): Payer: Self-pay

## 2022-07-27 ENCOUNTER — Other Ambulatory Visit: Payer: Self-pay

## 2022-07-27 ENCOUNTER — Emergency Department (HOSPITAL_COMMUNITY)
Admission: EM | Admit: 2022-07-27 | Discharge: 2022-07-28 | Disposition: A | Discharge status: Other Acute Inpatient Hospital | Payer: Federal, State, Local not specified - Other | Attending: Emergency Medicine | Admitting: Emergency Medicine

## 2022-07-27 ENCOUNTER — Encounter (HOSPITAL_COMMUNITY): Payer: Self-pay

## 2022-07-27 DIAGNOSIS — Z1152 Encounter for screening for COVID-19: Secondary | ICD-10-CM | POA: Insufficient documentation

## 2022-07-27 DIAGNOSIS — F22 Delusional disorders: Secondary | ICD-10-CM | POA: Insufficient documentation

## 2022-07-27 DIAGNOSIS — F19959 Other psychoactive substance use, unspecified with psychoactive substance-induced psychotic disorder, unspecified: Secondary | ICD-10-CM | POA: Diagnosis present

## 2022-07-27 DIAGNOSIS — F19951 Other psychoactive substance use, unspecified with psychoactive substance-induced psychotic disorder with hallucinations: Secondary | ICD-10-CM | POA: Insufficient documentation

## 2022-07-27 DIAGNOSIS — F458 Other somatoform disorders: Secondary | ICD-10-CM | POA: Insufficient documentation

## 2022-07-27 DIAGNOSIS — F419 Anxiety disorder, unspecified: Secondary | ICD-10-CM | POA: Insufficient documentation

## 2022-07-27 DIAGNOSIS — R000 Tachycardia, unspecified: Secondary | ICD-10-CM | POA: Insufficient documentation

## 2022-07-27 DIAGNOSIS — F410 Panic disorder [episodic paroxysmal anxiety] without agoraphobia: Secondary | ICD-10-CM | POA: Insufficient documentation

## 2022-07-27 DIAGNOSIS — J189 Pneumonia, unspecified organism: Secondary | ICD-10-CM

## 2022-07-27 DIAGNOSIS — Z720 Tobacco use: Secondary | ICD-10-CM | POA: Insufficient documentation

## 2022-07-27 DIAGNOSIS — J181 Lobar pneumonia, unspecified organism: Secondary | ICD-10-CM | POA: Insufficient documentation

## 2022-07-27 DIAGNOSIS — F4310 Post-traumatic stress disorder, unspecified: Secondary | ICD-10-CM | POA: Diagnosis present

## 2022-07-27 DIAGNOSIS — R059 Cough, unspecified: Secondary | ICD-10-CM

## 2022-07-27 DIAGNOSIS — R451 Restlessness and agitation: Secondary | ICD-10-CM | POA: Insufficient documentation

## 2022-07-27 HISTORY — DX: Other psychoactive substance use, unspecified with psychoactive substance-induced psychotic disorder, unspecified: F19.959

## 2022-07-27 LAB — CBC WITH DIFFERENTIAL/PLATELET
Abs Immature Granulocytes: 0.17 10*3/uL — ABNORMAL HIGH (ref 0.00–0.07)
Basophils Absolute: 0.1 10*3/uL (ref 0.0–0.1)
Basophils Relative: 1 %
Eosinophils Absolute: 0 10*3/uL (ref 0.0–0.5)
Eosinophils Relative: 0 %
HCT: 38.6 % — ABNORMAL LOW (ref 39.0–52.0)
Hemoglobin: 12.5 g/dL — ABNORMAL LOW (ref 13.0–17.0)
Immature Granulocytes: 2 %
Lymphocytes Relative: 12 %
Lymphs Abs: 1.4 10*3/uL (ref 0.7–4.0)
MCH: 28.9 pg (ref 26.0–34.0)
MCHC: 32.4 g/dL (ref 30.0–36.0)
MCV: 89.1 fL (ref 80.0–100.0)
Monocytes Absolute: 0.8 10*3/uL (ref 0.1–1.0)
Monocytes Relative: 7 %
Neutro Abs: 9.1 10*3/uL — ABNORMAL HIGH (ref 1.7–7.7)
Neutrophils Relative %: 78 %
Platelets: 394 10*3/uL (ref 150–400)
RBC: 4.33 MIL/uL (ref 4.22–5.81)
RDW: 15.6 % — ABNORMAL HIGH (ref 11.5–15.5)
WBC: 11.5 10*3/uL — ABNORMAL HIGH (ref 4.0–10.5)
nRBC: 0 % (ref 0.0–0.2)

## 2022-07-27 LAB — COMPREHENSIVE METABOLIC PANEL
ALT: 22 U/L (ref 0–44)
AST: 34 U/L (ref 15–41)
Albumin: 4.1 g/dL (ref 3.5–5.0)
Alkaline Phosphatase: 99 U/L (ref 38–126)
Anion gap: 15 (ref 5–15)
BUN: 8 mg/dL (ref 6–20)
CO2: 21 mmol/L — ABNORMAL LOW (ref 22–32)
Calcium: 9.8 mg/dL (ref 8.9–10.3)
Chloride: 100 mmol/L (ref 98–111)
Creatinine, Ser: 1.2 mg/dL (ref 0.61–1.24)
GFR, Estimated: >60 mL/min (ref >60)
Glucose, Bld: 131 mg/dL — ABNORMAL HIGH (ref 70–99)
Potassium: 3.4 mmol/L — ABNORMAL LOW (ref 3.5–5.1)
Sodium: 136 mmol/L (ref 135–145)
Total Bilirubin: 0.6 mg/dL (ref 0.3–1.2)
Total Protein: 9.5 g/dL — ABNORMAL HIGH (ref 6.5–8.1)

## 2022-07-27 LAB — BRAIN NATRIURETIC PEPTIDE: B Natriuretic Peptide: 12.3 pg/mL (ref 0.0–100.0)

## 2022-07-27 LAB — RESP PANEL BY RT-PCR (FLU A&B, COVID) ARPGX2
Influenza A by PCR: NEGATIVE
Influenza B by PCR: NEGATIVE
SARS Coronavirus 2 by RT PCR: NEGATIVE

## 2022-07-27 LAB — RAPID URINE DRUG SCREEN, HOSP PERFORMED
Amphetamines: POSITIVE — AB
Barbiturates: NOT DETECTED
Benzodiazepines: POSITIVE — AB
Cocaine: NOT DETECTED
Opiates: NOT DETECTED
Tetrahydrocannabinol: NOT DETECTED

## 2022-07-27 LAB — TROPONIN I (HIGH SENSITIVITY): Troponin I (High Sensitivity): 3 ng/L (ref <18)

## 2022-07-27 LAB — TSH: TSH: 2.958 u[IU]/mL (ref 0.350–4.500)

## 2022-07-27 LAB — SALICYLATE LEVEL: Salicylate Lvl: <7 mg/dL — ABNORMAL LOW (ref 7.0–30.0)

## 2022-07-27 LAB — ACETAMINOPHEN LEVEL: Acetaminophen (Tylenol), Serum: <10 ug/mL — ABNORMAL LOW (ref 10–30)

## 2022-07-27 LAB — ETHANOL: Alcohol, Ethyl (B): <10 mg/dL (ref <10)

## 2022-07-27 MED ORDER — HYDROXYZINE HCL 25 MG PO TABS: 25.0000 mg | ORAL_TABLET | Freq: Three times a day (TID) | ORAL | Status: DC | PRN

## 2022-07-27 MED ORDER — CLONAZEPAM 0.5 MG PO TABS: 0.5000 mg | ORAL_TABLET | Freq: Two times a day (BID) | ORAL | Status: DC | PRN

## 2022-07-27 MED ORDER — ALUM & MAG HYDROXIDE-SIMETH 200-200-20 MG/5ML PO SUSP: 30.0000 mL | Freq: Four times a day (QID) | ORAL | Status: DC | PRN

## 2022-07-27 MED ORDER — LORAZEPAM 2 MG/ML IJ SOLN
INTRAMUSCULAR | Status: AC
  Administered 2022-07-27: 2 mg via INTRAMUSCULAR
  Filled 2022-07-27: qty 1

## 2022-07-27 MED ORDER — ESCITALOPRAM OXALATE 10 MG PO TABS
10.0000 mg | ORAL_TABLET | Freq: Every day | ORAL | Status: DC
  Administered 2022-07-27 – 2022-07-28 (×2): 10 mg via ORAL
  Filled 2022-07-27 (×2): qty 1

## 2022-07-27 MED ORDER — ACETAMINOPHEN 325 MG PO TABS: 650.0000 mg | ORAL_TABLET | ORAL | Status: DC | PRN

## 2022-07-27 MED ORDER — POTASSIUM CHLORIDE CRYS ER 20 MEQ PO TBCR
40.0000 meq | EXTENDED_RELEASE_TABLET | Freq: Once | ORAL | Status: AC
  Administered 2022-07-27: 40 meq via ORAL
  Filled 2022-07-27: qty 2

## 2022-07-27 MED ORDER — OLANZAPINE 5 MG PO TABS
5.0000 mg | ORAL_TABLET | Freq: Two times a day (BID) | ORAL | Status: DC
  Administered 2022-07-27 – 2022-07-28 (×3): 5 mg via ORAL
  Filled 2022-07-27 (×3): qty 1

## 2022-07-27 MED ORDER — LACTATED RINGERS IV BOLUS
1000.0000 mL | Freq: Once | INTRAVENOUS | Status: AC
  Administered 2022-07-27: 1000 mL via INTRAVENOUS

## 2022-07-27 MED ORDER — RIVAROXABAN 15 MG PO TABS
15.0000 mg | ORAL_TABLET | Freq: Two times a day (BID) | ORAL | Status: DC
  Administered 2022-07-27 – 2022-07-28 (×3): 15 mg via ORAL
  Filled 2022-07-27 (×3): qty 1

## 2022-07-27 MED ORDER — ONDANSETRON HCL 4 MG PO TABS: 4.0000 mg | ORAL_TABLET | Freq: Three times a day (TID) | ORAL | Status: DC | PRN

## 2022-07-27 MED ORDER — AMOXICILLIN-POT CLAVULANATE 875-125 MG PO TABS
1.0000 | ORAL_TABLET | Freq: Two times a day (BID) | ORAL | Status: DC
  Administered 2022-07-27 – 2022-07-28 (×3): 1 via ORAL
  Filled 2022-07-27 (×3): qty 1

## 2022-07-27 MED ORDER — ZIPRASIDONE MESYLATE 20 MG IM SOLR: 20.0000 mg | Freq: Once | INTRAMUSCULAR | Status: DC

## 2022-07-27 MED ORDER — LORAZEPAM 1 MG PO TABS
2.0000 mg | ORAL_TABLET | Freq: Once | ORAL | Status: AC
  Administered 2022-07-27: 2 mg via ORAL
  Filled 2022-07-27: qty 2

## 2022-07-27 MED ORDER — IOHEXOL 350 MG/ML SOLN
75.0000 mL | Freq: Once | INTRAVENOUS | Status: AC | PRN
  Administered 2022-07-27: 75 mL via INTRAVENOUS

## 2022-07-27 MED ORDER — HALOPERIDOL LACTATE 5 MG/ML IJ SOLN
5.0000 mg | Freq: Once | INTRAMUSCULAR | Status: AC
  Administered 2022-07-27: 5 mg via INTRAMUSCULAR
  Filled 2022-07-27: qty 1

## 2022-07-27 MED ORDER — RIVAROXABAN 20 MG PO TABS: 20.0000 mg | ORAL_TABLET | Freq: Every day | ORAL | Status: DC

## 2022-07-27 NOTE — ED Notes (Signed)
Pt. Refused labs, xray, and meds.

## 2022-07-27 NOTE — Consult Note (Signed)
[facility] ED ASSESSMENT   Reason for Consult:  Psychiatry evaluation Referring Physician:  ER Physician Patient Identification: Joseph [name] MRN:  [id number] ED Chief Complaint: Psychoactive substance-induced psychosis (HCC)  Diagnosis:  Principal Problem:   Psychoactive substance-induced psychosis (HCC) Active Problems:   Anxiety disorder   ED Assessment Time Calculation: Start Time: 1714 Stop Time: 1740 Total Time in Minutes (Assessment Completion): 26   Subjective:   Joseph [name] is a 26 y.o. male patient admitted with hx significant for depression and anxiety came to the ER last with c/o severe anxiety /panic attack.  Patient also reports that he is from [place] and that he has been in [place] for couple of years.  He also reports that he was hospitalized several times in the inpatient ward while in [place] .but does not remember his full diagnosis and Medication list.  UDS is positive for Methamphetamine and Benzodiazepine.  HPI:  Patient covered himself from head to toe and was hiding his face while speaking to provider.  Patient admitted that when he came in last night he was restless irritable and could not sit still and was [facility] by the doctor,.  Patient reports that he used Methamphetamine twice weekly and uses Cannabis 3-4 times weekly and some times daily.  He drinks 4-5 16 Beer daily .  Patient reports that he is always believes people are out to get him.  He also reported frequently having racing thoughts.  He denied Bipolar disorder diagnosis.  He was last hospitalized in a mental health unit in [place] in 2011 and at the time he was worried people were out to get him.  He is unemployed and homeless he says.  He reports one time suicide attempt by OD in [place].  Currently he does not have outpatient Psychiatrist and is not involved in therapy.  Patient meets criteria for inpatient mental health admission.  We will seek bed placement at any facility with available beds.  Meanwhile we will start Hydroxyzine for  anxiety and Lexapro and for paranoia, patient is on Olanzapine twice a day.  Past Psychiatric History: Self reported depression and anxiety.  Inpatient Psychiatry hospitalization 6-7 in [place].  One suicide attempt  by OD in [place].  No current outpatient Psychiatrist.  Risk to Self or Others: Is the patient at risk to self? No Has the patient been a risk to self in the past 6 months? No Has the patient been a risk to self within the distant past? No Is the patient a risk to others? No Has the patient been a risk to others in the past 6 months? No Has the patient been a risk to others within the distant past? No  [place] Scale:  Flowsheet Row ED from 07/27/2022 in [facility] to Hosp-Admission (Discharged) from 07/12/2022 in [facility] 6 NORTH  SURGICAL  C-SSRS RISK CATEGORY No Risk No Risk       AIMS:  , , ,  ,   ASAM:    Substance Abuse:     Past Medical History: History reviewed. No pertinent past medical history.  Past Surgical History:  Procedure Laterality Date   CYST REMOVAL HAND  06/22/2022   Procedure: Repair left hand laceration;  Surgeon: Victorino Sparrow, MD;  Location: [facility] OR;  Service: Vascular;;   HEMATOMA EVACUATION Left 06/22/2022   Procedure: HEMOTHORAX EVACUATION;  Surgeon: Victorino Sparrow, MD;  Location: [facility] OR;  Service: Vascular;  Laterality: Left;   LAPAROTOMY N/A 06/22/2022  Procedure: EXPLORATORY LAPAROTOMY PLACEMENT OF LEFT CHEST TUBE. REPAIR OF 7 CENTIMETER RIGHT SCALP LACERATION. REPAIR OF SCALP LACERATION  AT CROWN.;  Surgeon: Gaynelle Adu, MD;  Location: [facility] OR;  Service: General;  Laterality: N/A;   WOUND EXPLORATION Left 06/22/2022   Procedure: Left chest exploration and intercostal artery ligation;  Surgeon: Victorino Sparrow, MD;  Location: [facility] OR;  Service: Vascular;  Laterality: Left;   Family History: History reviewed. No pertinent family history. Family Psychiatric  History: Denies, does not know much  about his family. Social History:  Social History   Substance and Sexual Activity  Alcohol Use None     Social History   Substance and Sexual Activity  Drug Use Not on file    Social History   Socioeconomic History   Marital status: Unknown    Spouse name: Not on file   Number of children: Not on file   Years of education: Not on file   Highest education level: Not on file  Occupational History   Not on file  Tobacco Use   Smoking status: Not on file   Smokeless tobacco: Not on file  Substance and Sexual Activity   Alcohol use: Not on file   Drug use: Not on file   Sexual activity: Not on file  Other Topics Concern   Not on file  Social History Narrative   ** Merged History Encounter **       Social Determinants of Health   Financial Resource Strain: Not on file  Food Insecurity: Not on file  Transportation Needs: Not on file  Physical Activity: Not on file  Stress: Not on file  Social Connections: Not on file   Additional Social History:    Allergies:  No Known Allergies  Labs:  Results for orders placed or performed during the hospital encounter of 07/27/22 (from the past 48 hour(s))  Comprehensive metabolic panel     Status: Abnormal   Collection Time: 07/27/22  5:28 AM  Result Value Ref Range   Sodium 136 135 - 145 mmol/L   Potassium 3.4 (L) 3.5 - 5.1 mmol/L   Chloride 100 98 - 111 mmol/L   CO2 21 (L) 22 - 32 mmol/L   Glucose, Bld 131 (H) 70 - 99 mg/dL    Comment: Glucose reference range applies only to samples taken after fasting for at least 8 hours.   BUN 8 6 - 20 mg/dL   Creatinine, Ser 1.74 0.61 - 1.24 mg/dL   Calcium 9.8 8.9 - 08.1 mg/dL   Total Protein 9.5 (H) 6.5 - 8.1 g/dL   Albumin 4.1 3.5 - 5.0 g/dL   AST 34 15 - 41 U/L   ALT 22 0 - 44 U/L   Alkaline Phosphatase 99 38 - 126 U/L   Total Bilirubin 0.6 0.3 - 1.2 mg/dL   GFR, Estimated >44 >81 mL/min    Comment: (NOTE) Calculated using the CKD-EPI Creatinine Equation (2021)    Anion  gap 15 5 - 15    Comment: Performed at [facility], [address], [place], [place] [address]  CBC with Differential     Status: Abnormal   Collection Time: 07/27/22  5:28 AM  Result Value Ref Range   WBC 11.5 (H) 4.0 - 10.5 K/uL   RBC 4.33 4.22 - 5.81 MIL/uL   Hemoglobin 12.5 (L) 13.0 - 17.0 g/dL   HCT 49.7 (L) 02.6 - 37.8 %   MCV 89.1 80.0 - 100.0 fL   MCH  28.9 26.0 - 34.0 pg   MCHC 32.4 30.0 - 36.0 g/dL   RDW 40.9 (H) 81.1 - 91.4 %   Platelets 394 150 - 400 K/uL   nRBC 0.0 0.0 - 0.2 %   Neutrophils Relative % 78 %   Neutro Abs 9.1 (H) 1.7 - 7.7 K/uL   Lymphocytes Relative 12 %   Lymphs Abs 1.4 0.7 - 4.0 K/uL   Monocytes Relative 7 %   Monocytes Absolute 0.8 0.1 - 1.0 K/uL   Eosinophils Relative 0 %   Eosinophils Absolute 0.0 0.0 - 0.5 K/uL   Basophils Relative 1 %   Basophils Absolute 0.1 0.0 - 0.1 K/uL   Immature Granulocytes 2 %   Abs Immature Granulocytes 0.17 (H) 0.00 - 0.07 K/uL    Comment: Performed at [facility], [address], [place], [place] [address]  Brain natriuretic peptide     Status: None   Collection Time: 07/27/22  5:28 AM  Result Value Ref Range   B Natriuretic Peptide 12.3 0.0 - 100.0 pg/mL    Comment: Performed at [facility], [address] [place], [place] [address]  Troponin I (High Sensitivity)     Status: None   Collection Time: 07/27/22  5:28 AM  Result Value Ref Range   Troponin I (High Sensitivity) 3 <18 ng/L    Comment: (NOTE) Elevated high sensitivity troponin I (hsTnI) values and significant  changes across serial measurements may suggest ACS but many other  chronic and acute conditions are known to elevate hsTnI results.  Refer to the "Links" section for chest pain algorithms and additional  guidance. Performed at [facility], [address], [place], [place] [address]   Salicylate level     Status: Abnormal   Collection Time: 07/27/22  5:28 AM   Result Value Ref Range   Salicylate Lvl <7.0 (L) 7.0 - 30.0 mg/dL    Comment: Performed at [facility], [address] [place], [place] [address]  Acetaminophen level     Status: Abnormal   Collection Time: 07/27/22  5:28 AM  Result Value Ref Range   Acetaminophen (Tylenol), Serum <10 (L) 10 - 30 ug/mL    Comment: (NOTE) Therapeutic concentrations vary significantly. A range of 10-30 ug/mL  may be an effective concentration for many patients. However, some  are best treated at concentrations outside of this range. Acetaminophen concentrations >150 ug/mL at 4 hours after ingestion  and >50 ug/mL at 12 hours after ingestion are often associated with  toxic reactions.  Performed at [facility], [address], [place], [place] [address]   TSH     Status: None   Collection Time: 07/27/22  7:08 AM  Result Value Ref Range   TSH 2.958 0.350 - 4.500 uIU/mL    Comment: Performed by a 3rd Generation assay with a functional sensitivity of <=0.01 uIU/mL. Performed at [facility], [address], [place], [place] [address]   Ethanol     Status: None   Collection Time: 07/27/22  8:36 AM  Result Value Ref Range   Alcohol, Ethyl (B) <10 <10 mg/dL    Comment: (NOTE) Lowest detectable limit for serum alcohol is 10 mg/dL.  For medical purposes only. Performed at [facility], [address], [place], [place] [address]   Resp Panel by RT-PCR (Flu A&B, Covid) Anterior Nasal Swab     Status: None   Collection Time: 07/27/22 10:15 AM  Specimen: Anterior Nasal Swab  Result Value Ref Range   SARS Coronavirus 2 by RT PCR NEGATIVE NEGATIVE    Comment: (NOTE) SARS-CoV-2 target nucleic acids are NOT DETECTED.  The SARS-CoV-2 RNA is generally detectable in upper respiratory specimens during the acute phase of infection. The lowest concentration of SARS-CoV-2 viral copies this assay can detect is 138 copies/mL. A  negative result does not preclude SARS-Cov-2 infection and should not be used as the sole basis for treatment or other patient management decisions. A negative result may occur with  improper specimen collection/handling, submission of specimen other than nasopharyngeal swab, presence of viral mutation(s) within the areas targeted by this assay, and inadequate number of viral copies(<138 copies/mL). A negative result must be combined with clinical observations, patient history, and epidemiological information. The expected result is Negative.  Fact Sheet for Patients:  BloggerCourse.com  Fact Sheet for Healthcare Providers:  SeriousBroker.it  This test is no t yet approved or cleared by the [place] FDA and  has been authorized for detection and/or diagnosis of SARS-CoV-2 by FDA under an Emergency Use Authorization (EUA). This EUA will remain  in effect (meaning this test can be used) for the duration of the COVID-19 declaration under Section 564(b)(1) of the Act, 21 U.S.C.section 360bbb-3(b)(1), unless the authorization is terminated  or revoked sooner.       Influenza A by PCR NEGATIVE NEGATIVE   Influenza B by PCR NEGATIVE NEGATIVE    Comment: (NOTE) The Xpert Xpress SARS-CoV-2/FLU/RSV plus assay is intended as an aid in the diagnosis of influenza from Nasopharyngeal swab specimens and should not be used as a sole basis for treatment. Nasal washings and aspirates are unacceptable for Xpert Xpress SARS-CoV-2/FLU/RSV testing.  Fact Sheet for Patients: BloggerCourse.com  Fact Sheet for Healthcare Providers: SeriousBroker.it  This test is not yet approved or cleared by the [place] FDA and has been authorized for detection and/or diagnosis of SARS-CoV-2 by FDA under an Emergency Use Authorization (EUA). This EUA will remain in effect (meaning this test can be used)  for the duration of the COVID-19 declaration under Section 564(b)(1) of the Act, 21 U.S.C. section 360bbb-3(b)(1), unless the authorization is terminated or revoked.  Performed at [facility], [address], [place], [place] [address]   Urine rapid drug screen (hosp performed)     Status: Abnormal   Collection Time: 07/27/22  1:25 PM  Result Value Ref Range   Opiates NONE DETECTED NONE DETECTED   Cocaine NONE DETECTED NONE DETECTED   Benzodiazepines POSITIVE (A) NONE DETECTED   Amphetamines POSITIVE (A) NONE DETECTED   Tetrahydrocannabinol NONE DETECTED NONE DETECTED   Barbiturates NONE DETECTED NONE DETECTED    Comment: (NOTE) DRUG SCREEN FOR MEDICAL PURPOSES ONLY.  IF CONFIRMATION IS NEEDED FOR ANY PURPOSE, NOTIFY LAB WITHIN 5 DAYS.  LOWEST DETECTABLE LIMITS FOR URINE DRUG SCREEN Drug Class                     Cutoff (ng/mL) Amphetamine and metabolites    1000 Barbiturate and metabolites    200 Benzodiazepine                 200 Opiates and metabolites        300 Cocaine and metabolites        300 THC                            50 Performed  at [facility], [address] [place], [place] [address]     Current Facility-Administered Medications  Medication Dose Route Frequency Provider Last Rate Last Admin   acetaminophen (TYLENOL) tablet 650 mg  650 mg Oral Q4H PRN Mardene Sayer, MD       alum & mag hydroxide-simeth (MAALOX/MYLANTA) 200-200-20 MG/5ML suspension 30 mL  30 mL Oral Q6H PRN Mardene Sayer, MD       amoxicillin-clavulanate (AUGMENTIN) 875-125 MG per tablet 1 tablet  1 tablet Oral Q12H Mardene Sayer, MD   1 tablet at 07/27/22 1009   clonazePAM (KLONOPIN) tablet 0.5 mg  0.5 mg Oral BID PRN Mardene Sayer, MD       escitalopram (LEXAPRO) tablet 10 mg  10 mg Oral Daily [name] Byes C, NP       hydrOXYzine (ATARAX) tablet 25 mg  25 mg Oral TID PRN Earney Navy, NP       OLANZapine  (ZYPREXA) tablet 5 mg  5 mg Oral BID Vivien Rossetti C, MD   5 mg at 07/27/22 1009   ondansetron (ZOFRAN) tablet 4 mg  4 mg Oral Q8H PRN Mardene Sayer, MD       Rivaroxaban Carlena Hurl) tablet 15 mg  15 mg Oral BID Vivien Rossetti C, MD   15 mg at 07/27/22 1012   [START ON 08/06/2022] rivaroxaban (XARELTO) tablet 20 mg  20 mg Oral Daily Mardene Sayer, MD       Current Outpatient Medications  Medication Sig Dispense Refill   acetaminophen (TYLENOL) 500 MG tablet Take 2 tablets (1,000 mg total) by mouth every 6 (six) hours as needed. (Patient taking differently: Take 1,000 mg by mouth every 6 (six) hours as needed for mild pain.) 30 tablet 0   clonazePAM (KLONOPIN) 0.5 MG tablet Take 1 tablet (0.5 mg total) by mouth 2 (two) times daily as needed for anxiety. 20 tablet 0   mupirocin ointment (BACTROBAN) 2 % Apply topically 2 (two) times daily. 22 g 0   RIVAROXABAN (XARELTO) VTE STARTER PACK (15 & 20 MG) Follow package instructions: Take 1 tablet ( ) by mouth 2 (two) times daily with a meal for 21 days, then take 1 tablet ( ) once daily. 51 each 0   folic acid (FOLVITE) 1 MG tablet Take 1 tablet (1 mg total) by mouth daily. (Patient not taking: Reported on 07/27/2022)     Multiple Vitamin (MULTIVITAMIN WITH MINERALS) TABS tablet Take 1 tablet by mouth daily. (Patient not taking: Reported on 07/27/2022)     OLANZapine (ZYPREXA) 5 MG tablet Take 1 tablet (5 mg total) by mouth 2 (two) times daily. (Patient not taking: Reported on 07/27/2022) 60 tablet 0   thiamine (VITAMIN B-1) 100 MG tablet Take 1 tablet (100 mg total) by mouth daily. (Patient not taking: Reported on 07/27/2022)      Musculoskeletal: Strength & Muscle Tone:  seen in the stretcher sleeping Gait & Station:  seen in the stretcher sleeping Patient leans:  see above   Psychiatric Specialty Exam: Presentation  General Appearance:  Casual; Disheveled  Eye Contact: Fleeting  Speech: Slow; Clear and  Coherent  Speech Volume: Decreased  Handedness: Right   Mood and Affect  Mood: Anxious; Depressed; Irritable; Labile  Affect: Congruent; Depressed; Labile   Thought Process  Thought Processes: Coherent; Linear  Descriptions of Associations:Intact  Orientation:Full (Time, Place and Person)  Thought Content:Paranoid Ideation; Illogical  History of Schizophrenia/Schizoaffective disorder:Yes  Duration of Psychotic Symptoms:Greater than six months  Hallucinations:Hallucinations: None  Ideas of Reference:Paranoia  Suicidal Thoughts:Suicidal Thoughts: No  Homicidal Thoughts:Homicidal Thoughts: No   Sensorium  Memory: Immediate Fair; Recent Fair; Remote Fair  Judgment: Impaired  Insight: Fair   Chartered certified accountantxecutive Functions  Concentration: Fair  Attention Span: Fair  Recall: Good  Fund of Knowledge: Good  Language: Good   Psychomotor Activity  Psychomotor Activity: Psychomotor Activity: Normal   Assets  Assets: Communication Skills; Desire for Improvement; Physical Health    Sleep  Sleep: Sleep: Fair   Physical Exam: Physical Exam Constitutional:      Appearance: Normal appearance.  HENT:     Nose: Nose normal.  Cardiovascular:     Rate and Rhythm: Normal rate.  Pulmonary:     Effort: Pulmonary effort is normal.  Musculoskeletal:        General: Normal range of motion.     Cervical back: Normal range of motion.  Skin:    General: Skin is warm and dry.  Neurological:     Mental Status: He is alert and oriented to person, place, and time.    Review of Systems  Constitutional: Negative.   HENT: Negative.    Eyes: Negative.   Respiratory: Negative.    Cardiovascular: Negative.   Gastrointestinal: Negative.   Genitourinary: Negative.   Musculoskeletal: Negative.   Skin: Negative.   Neurological: Negative.   Endo/Heme/Allergies: Negative.   Psychiatric/Behavioral:  Positive for depression and substance abuse. The patient is  nervous/anxious.    Blood pressure 125/72, pulse 100, temperature 98 F (36.7 C), resp. rate 20, SpO2 100 %. There is no height or weight on file to calculate BMI.  Medical Decision Making: Patient meets criteria for inpatient Psychiatric hospitalization for stabilization.  We have started low dose Olanzapine for Psychosis, Lexapro for depression and anxiety and Hydroxyzine as needed for anxiety.  Once bed is available patient is transferred.  We will fax record to other facilities with available beds.  Problem 1: Psychoactive  substance -induced Psychosis  Problem 2: Anxiety disorder  Problem 3: Polysubstance abuse.  Disposition:  Admit, seek placement   Earney NavyJosephine C Shawnte Winton, NP-PMHNP-BC 07/27/2022 5:52 PM

## 2022-07-27 NOTE — ED Provider Notes (Signed)
  Physical Exam  BP 125/72   Pulse 100   Temp 98 F (36.7 C)   Resp 20   SpO2 100%   Physical Exam Constitutional:      Comments: NAD, nontoxic  HENT:     Mouth/Throat:     Mouth: Mucous membranes are moist.  Cardiovascular:     Rate and Rhythm: Regular rhythm. Tachycardia present.  Pulmonary:     Effort: Pulmonary effort is normal.     Breath sounds: Normal breath sounds.  Neurological:     Mental Status: He is alert.     Procedures  Procedures  ED Course / MDM   Clinical Course as of 07/27/22 0950  Mon Jul 27, 2022  0706 26 with pmh depression, anxiety presents today actively psychotic not taking his medications and HI. Follow up with CTA Pe study. Will need med rec. Either TTS/placement vs admit and inpatient psych. [VB]  [id number] Patient's CTA PE studyperformed "IMPRESSION: Suboptimal evaluation, within these constraints;  1. No segmental or larger pulmonary embolus. 2. Mild residual consolidation within the RIGHT lower lobe, with 1 cm pulmonary cystic versus cavitary focus. Findings favored to represent postinflammatory/revolving pneumonia."  No large PE or new PE.  He does have evidence of possible resolving pneumonia.  He endorses continued productive cough with a white blood cell count 11.5.  However he is afebrile and not hypoxic and has no increased work of breathing.  We will give him 7 days of Augmentin and restart his home Xarelto.  His other labs have been reviewed which are all generally unremarkable.  He is medically cleared and TTS consult has been placed as well as pharmacy consult for home medications. [VB]    Clinical Course User Index [VB] Mardene Sayer, MD   Medical Decision Making Amount and/or Complexity of Data Reviewed Labs: ordered. Radiology: ordered.  Risk OTC drugs. Prescription drug management.          Mardene Sayer, MD 07/27/22 [phone]
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
## 2022-07-27 NOTE — ED Notes (Signed)
Pt belongs in [place] 19-22

## 2022-07-27 NOTE — ED Triage Notes (Signed)
Pt is very nervous, paranoid.  York Spaniel he is having a panic attack right now and he does not feel well. Wants something now for it.

## 2022-07-27 NOTE — ED Notes (Signed)
Pt. States, "I don't feel comfortable here. I was trying to go to [facility]." Pt. Is up pacing at the moment asking, "How long do I have to be here?"
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
## 2022-07-28 ENCOUNTER — Inpatient Hospital Stay (HOSPITAL_COMMUNITY)
Admission: AD | Admit: 2022-07-28 | Discharge: 2022-08-03 | DRG: 885 | Disposition: A | Discharge status: Home / Self Care | Payer: Federal, State, Local not specified - Other | Source: Intra-hospital | Attending: Psychiatry | Admitting: Psychiatry

## 2022-07-28 ENCOUNTER — Encounter (HOSPITAL_COMMUNITY): Payer: Self-pay | Admitting: Nurse Practitioner

## 2022-07-28 ENCOUNTER — Other Ambulatory Visit: Payer: Self-pay

## 2022-07-28 DIAGNOSIS — F1010 Alcohol abuse, uncomplicated: Secondary | ICD-10-CM | POA: Diagnosis present

## 2022-07-28 DIAGNOSIS — J189 Pneumonia, unspecified organism: Secondary | ICD-10-CM | POA: Diagnosis present

## 2022-07-28 DIAGNOSIS — F1510 Other stimulant abuse, uncomplicated: Secondary | ICD-10-CM | POA: Insufficient documentation

## 2022-07-28 DIAGNOSIS — Z653 Problems related to other legal circumstances: Secondary | ICD-10-CM

## 2022-07-28 DIAGNOSIS — F333 Major depressive disorder, recurrent, severe with psychotic symptoms: Secondary | ICD-10-CM | POA: Diagnosis not present

## 2022-07-28 DIAGNOSIS — F1994 Other psychoactive substance use, unspecified with psychoactive substance-induced mood disorder: Secondary | ICD-10-CM | POA: Diagnosis not present

## 2022-07-28 DIAGNOSIS — Z62810 Personal history of physical and sexual abuse in childhood: Secondary | ICD-10-CM

## 2022-07-28 DIAGNOSIS — R45851 Suicidal ideations: Secondary | ICD-10-CM | POA: Diagnosis present

## 2022-07-28 DIAGNOSIS — F17210 Nicotine dependence, cigarettes, uncomplicated: Secondary | ICD-10-CM | POA: Diagnosis present

## 2022-07-28 DIAGNOSIS — F17200 Nicotine dependence, unspecified, uncomplicated: Secondary | ICD-10-CM | POA: Insufficient documentation

## 2022-07-28 DIAGNOSIS — F1514 Other stimulant abuse with stimulant-induced mood disorder: Secondary | ICD-10-CM | POA: Diagnosis present

## 2022-07-28 DIAGNOSIS — Z833 Family history of diabetes mellitus: Secondary | ICD-10-CM

## 2022-07-28 DIAGNOSIS — Z86711 Personal history of pulmonary embolism: Secondary | ICD-10-CM | POA: Diagnosis not present

## 2022-07-28 DIAGNOSIS — F1090 Alcohol use, unspecified, uncomplicated: Secondary | ICD-10-CM | POA: Insufficient documentation

## 2022-07-28 DIAGNOSIS — F339 Major depressive disorder, recurrent, unspecified: Secondary | ICD-10-CM | POA: Diagnosis present

## 2022-07-28 DIAGNOSIS — F1410 Cocaine abuse, uncomplicated: Secondary | ICD-10-CM | POA: Insufficient documentation

## 2022-07-28 DIAGNOSIS — F4310 Post-traumatic stress disorder, unspecified: Secondary | ICD-10-CM | POA: Diagnosis present

## 2022-07-28 MED ORDER — AMOXICILLIN-POT CLAVULANATE 875-125 MG PO TABS
1.0000 | ORAL_TABLET | Freq: Two times a day (BID) | ORAL | Status: AC
  Administered 2022-07-28 – 2022-08-03 (×12): 1 via ORAL
  Filled 2022-07-28 (×12): qty 1

## 2022-07-28 MED ORDER — OLANZAPINE 5 MG PO TABS
5.0000 mg | ORAL_TABLET | Freq: Two times a day (BID) | ORAL | Status: DC
  Administered 2022-07-28 – 2022-07-29 (×2): 5 mg via ORAL
  Filled 2022-07-28 (×6): qty 1

## 2022-07-28 MED ORDER — CLONAZEPAM 0.5 MG PO TABS: 0.5000 mg | ORAL_TABLET | Freq: Two times a day (BID) | ORAL | Status: DC | PRN

## 2022-07-28 MED ORDER — ACETAMINOPHEN 325 MG PO TABS: 650.0000 mg | ORAL_TABLET | ORAL | Status: DC | PRN

## 2022-07-28 MED ORDER — HYDROXYZINE HCL 25 MG PO TABS
25.0000 mg | ORAL_TABLET | Freq: Three times a day (TID) | ORAL | Status: DC | PRN
  Administered 2022-07-28 – 2022-08-02 (×7): 25 mg via ORAL
  Filled 2022-07-28 (×7): qty 1

## 2022-07-28 MED ORDER — RIVAROXABAN 15 MG PO TABS
15.0000 mg | ORAL_TABLET | Freq: Two times a day (BID) | ORAL | Status: DC
  Administered 2022-07-28 – 2022-08-03 (×12): 15 mg via ORAL
  Filled 2022-07-28 (×19): qty 1

## 2022-07-28 MED ORDER — ONDANSETRON HCL 4 MG PO TABS: 4.0000 mg | ORAL_TABLET | Freq: Three times a day (TID) | ORAL | Status: DC | PRN

## 2022-07-28 MED ORDER — ALUM & MAG HYDROXIDE-SIMETH 200-200-20 MG/5ML PO SUSP: 30.0000 mL | Freq: Four times a day (QID) | ORAL | Status: DC | PRN

## 2022-07-28 MED ORDER — ESCITALOPRAM OXALATE 10 MG PO TABS
10.0000 mg | ORAL_TABLET | Freq: Every day | ORAL | Status: DC
  Administered 2022-07-29 – 2022-07-31 (×3): 10 mg via ORAL
  Filled 2022-07-28 (×5): qty 1

## 2022-07-28 NOTE — Progress Notes (Signed)
CSW requested [facility] Tosin, RN for [facility]. CSW will assist and follow with placement.  Maryjean Ka, MSW, [facility] 07/28/2022 12:27 AM

## 2022-07-28 NOTE — ED Provider Notes (Signed)
Emergency Medicine Observation Re-evaluation Note  Deandre Brannan is a 26 y.o. male, seen on rounds today.  Pt initially presented to the ED for complaints of Panic Attack Currently, the patient is resting comfortably.  Physical Exam  BP (!) 135/92 (BP Location: Left Arm)   Pulse 93   Temp 98.1 F (36.7 C) (Oral)   Resp 18   SpO2 98%  Physical Exam General: NAD   ED Course / MDM  EKG:EKG Interpretation  Date/Time:  Monday July 27 2022 06:22:21 EST Ventricular Rate:  109 PR Interval:  152 QRS Duration: 86 QT Interval:  336 QTC Calculation: 453 R Axis:   91 Text Interpretation: Sinus tachycardia Borderline right axis deviation Borderline T wave abnormalities No previous ECGs available Confirmed by Tilden Fossa [phone]) on 07/27/2022 6:24:17 AM  I have reviewed the labs performed to date as well as medications administered while in observation.  Recent changes in the last 24 hours include no acute events reported.  Plan  Current plan is for placement.    Wynetta Fines, MD 07/28/22 1023

## 2022-07-28 NOTE — Progress Notes (Signed)
Pt was accepted to [facility] TODAY 07/28/22; Bed Assignment 402-1  Pt meets inpatient criteria per Earney Navy, NP   Attending Physician will be Dr. Sherron Flemings  Report can be called to: -Adult unit: [phone]  Pt can arrive at: 1200  Care Team notified: [facility] Rona Ravens, RN, Earney Navy, NP, and [facility], RN  [place], LCSWA 07/28/2022 @ 9:31 AM
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
## 2022-07-28 NOTE — ED Notes (Signed)
Pt in bed, pt calm and cooperative, meal tray given, pt states that he is feeling better, updated pt on plan of care

## 2022-07-28 NOTE — Progress Notes (Signed)
Inpatient Behavioral Health Placement  Meets inpatient criteria per Earney Navy, NP. There are no available beds at [facility].  Referral was sent to the following facilities;    Destination  Service Provider Address Phone Fax  [facility] [address] [place] [address] [phone]  [facility]  [address], [place] [address] [phone]  [facility]  [address] [place] [address] [phone]  [facility]  [address] [place], [place] [address] [phone]  [facility]  [address] [place] [address] [phone]  [facility]  [address] [place] [address] [phone]  [facility]  [address], [place] [address] [phone]  [facility]  [address] [place] [address] [phone]  [facility]  [address], [place] [address] [phone]  [facility]  [address] [place] [address] [phone]  [facility]  [address] [place] [address] [phone]  [facility]  [address] [place] [address] [phone]  [facility]  [address] [place] [address] [phone]  [facility]  [address], [place] [address] [phone]  [facility]  [address] [place] [address] [phone]   Situation ongoing,  CSW will follow up.   Maryjean Ka, MSW, LCSWA 07/28/2022  @ 8:41 AM

## 2022-07-28 NOTE — Plan of Care (Signed)
  Problem: Coping: Goal: Ability to verbalize frustrations and anger appropriately will improve Outcome: Progressing Goal: Ability to demonstrate self-control will improve Outcome: Progressing   

## 2022-07-28 NOTE — ED Notes (Signed)
Belongings returned to pt, pt states that he has all of his things, states that he is ready to go, report to PD, d/c pt iv, cath intact, pt ambulatory from dpt.

## 2022-07-28 NOTE — ED Notes (Signed)
PD called for transport
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
## 2022-07-28 NOTE — ED Notes (Signed)
Pt in bed, pt arouses easily to verbal stim, pt calm and cooperative, pt awaits transport to bhh

## 2022-07-28 NOTE — ED Notes (Signed)
Pt in bed, resps even and unlabored, sitter at bedside

## 2022-07-29 ENCOUNTER — Encounter (HOSPITAL_COMMUNITY): Payer: Self-pay | Admitting: Nurse Practitioner

## 2022-07-29 ENCOUNTER — Encounter (HOSPITAL_COMMUNITY): Payer: Self-pay

## 2022-07-29 DIAGNOSIS — F1510 Other stimulant abuse, uncomplicated: Secondary | ICD-10-CM | POA: Insufficient documentation

## 2022-07-29 DIAGNOSIS — F1090 Alcohol use, unspecified, uncomplicated: Secondary | ICD-10-CM | POA: Insufficient documentation

## 2022-07-29 DIAGNOSIS — F339 Major depressive disorder, recurrent, unspecified: Secondary | ICD-10-CM | POA: Insufficient documentation

## 2022-07-29 DIAGNOSIS — F1994 Other psychoactive substance use, unspecified with psychoactive substance-induced mood disorder: Secondary | ICD-10-CM | POA: Diagnosis not present

## 2022-07-29 DIAGNOSIS — F1410 Cocaine abuse, uncomplicated: Secondary | ICD-10-CM | POA: Insufficient documentation

## 2022-07-29 DIAGNOSIS — F17200 Nicotine dependence, unspecified, uncomplicated: Secondary | ICD-10-CM | POA: Insufficient documentation

## 2022-07-29 MED ORDER — VITAMIN B-1 100 MG PO TABS
100.0000 mg | ORAL_TABLET | Freq: Every day | ORAL | Status: DC
  Administered 2022-07-30 – 2022-08-03 (×4): 100 mg via ORAL
  Filled 2022-07-29 (×7): qty 1

## 2022-07-29 MED ORDER — THIAMINE HCL 100 MG/ML IJ SOLN
100.0000 mg | Freq: Once | INTRAMUSCULAR | Status: AC
  Administered 2022-07-29: 100 mg via INTRAMUSCULAR
  Filled 2022-07-29: qty 2

## 2022-07-29 MED ORDER — HYDROXYZINE HCL 25 MG PO TABS: 25.0000 mg | ORAL_TABLET | Freq: Four times a day (QID) | ORAL | Status: DC | PRN

## 2022-07-29 MED ORDER — LOPERAMIDE HCL 2 MG PO CAPS: 2.0000 mg | ORAL_CAPSULE | ORAL | Status: AC | PRN

## 2022-07-29 MED ORDER — ADULT MULTIVITAMIN W/MINERALS CH
1.0000 | ORAL_TABLET | Freq: Every day | ORAL | Status: DC
  Administered 2022-07-29 – 2022-08-02 (×4): 1 via ORAL
  Filled 2022-07-29 (×9): qty 1

## 2022-07-29 MED ORDER — NICOTINE 21 MG/24HR TD PT24
21.0000 mg | MEDICATED_PATCH | Freq: Every day | TRANSDERMAL | Status: DC
  Filled 2022-07-29 (×6): qty 1

## 2022-07-29 MED ORDER — LORAZEPAM 1 MG PO TABS: 1.0000 mg | ORAL_TABLET | ORAL | Status: DC | PRN

## 2022-07-29 MED ORDER — OLANZAPINE 5 MG PO TABS
5.0000 mg | ORAL_TABLET | Freq: Two times a day (BID) | ORAL | Status: DC
  Administered 2022-07-29 – 2022-07-30 (×2): 5 mg via ORAL
  Filled 2022-07-29 (×4): qty 1

## 2022-07-29 MED ORDER — ONDANSETRON 4 MG PO TBDP: 4.0000 mg | ORAL_TABLET | Freq: Four times a day (QID) | ORAL | Status: DC | PRN

## 2022-07-29 MED ORDER — ZIPRASIDONE MESYLATE 20 MG IM SOLR: 20.0000 mg | INTRAMUSCULAR | Status: DC | PRN

## 2022-07-29 MED ORDER — OLANZAPINE 5 MG PO TBDP: 5.0000 mg | ORAL_TABLET | Freq: Three times a day (TID) | ORAL | Status: DC | PRN

## 2022-07-29 MED ORDER — LORAZEPAM 1 MG PO TABS: 1.0000 mg | ORAL_TABLET | Freq: Four times a day (QID) | ORAL | Status: AC | PRN
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
## 2022-07-29 NOTE — Plan of Care (Signed)
  Problem: Education: Goal: Emotional status will improve Outcome: Not Progressing Goal: Mental status will improve Outcome: Not Progressing   Problem: Activity: Goal: Interest or engagement in activities will improve Outcome: Not Progressing   

## 2022-07-29 NOTE — BHH Counselor (Addendum)
After discussion in morning progression CSW sent a referral to [name].  CSW spoke with Joseph [name] [phone] who states that the Pt "has been accepted to the program and come in for an intake assessment on 08/03/2022 at 9:00am, if his medical records check out".  She stated that she would call the CSW back to confirm the Pt's admission once the nurse had reviewed the records.  CSW was then later informed by the providers that the Pt is refusing to attend [name] and would like to return home without inpatient treatment.  CSW will provide the Pt with information for treatment centers that the Pt may access in the future.  CSW was then informed by Joseph [name] at [facility] that the Pt was denied by the nurse "due to medical reasons".

## 2022-07-29 NOTE — Progress Notes (Signed)
Abdominal wound dressing change done at 1830, no drainage noted. Wound length is approximately 6 cm, width is 0.8 cm, wound has no depth to it. Patient tolerated dressing change well. Staff will continue to provide support to patient.

## 2022-07-29 NOTE — Group Note (Signed)
LCSW Group Therapy Note   Group Date: 07/29/2022 Start Time: 1300 End Time: 1400  Type of Therapy and Topic:  Group Therapy:  Strengths Exploration   Participation Level: Did Not Attend  Description of Group: This group allows individuals to explore their strengths, learn to use strengths in new ways to improve well-being. Strengths-based interventions involve identifying strengths, understanding how they are used, and learning new ways to apply them. Individuals will identify their strengths, and then explore their roles in different areas of life (relationships, professional life, and personal fulfillment). Individuals will think about ways in which they currently use their strengths, along with new ways they could begin using them.    Therapeutic Goals Patient will verbalize two of their strengths Patient will identify how their strengths are currently used Patient will identify two new ways to apply their strengths  Patients will create a plan to apply their strengths in their daily lives     Summary of Patient Progress:  Did not attend     Therapeutic Modalities Cognitive Behavioral Therapy Motivational Interviewing  Joseph [name], [place] 07/29/2022  2:14 PM

## 2022-07-29 NOTE — Plan of Care (Signed)
  Problem: Education: Goal: Knowledge of [facility] Education information/materials will improve Outcome: Progressing Goal: Emotional status will improve Outcome: Progressing Goal: Mental status will improve Outcome: Progressing Goal: Verbalization of understanding the information provided will improve Outcome: Progressing   Problem: Coping: Goal: Ability to verbalize frustrations and anger appropriately will improve Outcome: Progressing Goal: Ability to demonstrate self-control will improve Outcome: Progressing   

## 2022-07-29 NOTE — BHH Group Notes (Signed)
Patient did not attend the NA group. 
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
## 2022-07-29 NOTE — Progress Notes (Signed)
   07/29/22 0900  Psych Admission Type (Psych Patients Only)  Admission Status Involuntary  Psychosocial Assessment  Patient Complaints Anxiety;Depression;Sadness  Eye Contact Fair  Facial Expression Flat;Sad  Affect Depressed  Speech Logical/coherent;Soft  Interaction Cautious  Motor Activity Other (Comment) (wnl)  Appearance/Hygiene Unremarkable  Behavior Characteristics Cooperative;Calm  Mood Depressed;Anxious;Sad  Thought Process  Coherency WDL  Content WDL  Delusions WDL  Perception WDL  Hallucination None reported or observed  Judgment WDL  Confusion None  Danger to Self  Current suicidal ideation? Denies  Danger to Others  Danger to Others None reported or observed

## 2022-07-29 NOTE — Progress Notes (Signed)
     07/28/22 2113  Psych Admission Type (Psych Patients Only)  Admission Status Involuntary  Psychosocial Assessment  Patient Complaints Anxiety;Depression;Sadness  Eye Contact Fair  Facial Expression Flat;Sad  Affect Depressed  Speech Soft;Logical/coherent  Interaction Cautious;Forwards little;Isolative  Motor Activity Other (Comment) (WDL)  Appearance/Hygiene Unremarkable  Behavior Characteristics Cooperative;Calm  Mood Depressed;Anxious;Sad  Thought Process  Coherency WDL  Content WDL  Delusions None reported or observed  Perception WDL  Hallucination None reported or observed  Judgment WDL  Confusion None  Danger to Self  Current suicidal ideation? Denies  Danger to Others  Danger to Others None reported or observed

## 2022-07-29 NOTE — Progress Notes (Signed)
   07/29/22 0500  Sleep  Number of Hours 8.25
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
## 2022-07-30 DIAGNOSIS — F1994 Other psychoactive substance use, unspecified with psychoactive substance-induced mood disorder: Secondary | ICD-10-CM | POA: Diagnosis not present

## 2022-07-30 LAB — LIPID PANEL
Cholesterol: 148 mg/dL (ref 0–200)
HDL: 60 mg/dL (ref >40)
LDL Cholesterol: 61 mg/dL (ref 0–99)
Total CHOL/HDL Ratio: 2.5 RATIO
Triglycerides: 133 mg/dL (ref <150)
VLDL: 27 mg/dL (ref 0–40)

## 2022-07-30 MED ORDER — SODIUM CHLORIDE 0.9 % IN NEBU
INHALATION_SOLUTION | RESPIRATORY_TRACT | Status: AC
  Filled 2022-07-30: qty 12

## 2022-07-30 MED ORDER — TRAZODONE HCL 50 MG PO TABS
50.0000 mg | ORAL_TABLET | Freq: Every evening | ORAL | Status: DC | PRN
  Administered 2022-08-01: 50 mg via ORAL
  Filled 2022-07-30: qty 1

## 2022-07-30 MED ORDER — OLANZAPINE 5 MG PO TABS
5.0000 mg | ORAL_TABLET | Freq: Every day | ORAL | Status: DC
  Administered 2022-07-31 – 2022-08-02 (×3): 5 mg via ORAL
  Filled 2022-07-30 (×5): qty 1
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
## 2022-07-30 NOTE — Progress Notes (Signed)
The patient rated his day as a 10 out of 10 since he rested. His positive event for the day is that he spoke with his baby's mother.

## 2022-07-30 NOTE — Progress Notes (Signed)
   07/30/22 0516  Sleep  Number of Hours 5.5

## 2022-07-30 NOTE — Hospital Course (Signed)
Discharge Sunday

## 2022-07-30 NOTE — BHH Suicide Risk Assessment (Signed)
CSW spoke with the [employer], P.O. [place] [phone], who states that the Pt was living with a friend and has been using substances "on the street".  She states that the Pt was going to [facility] the [place] for therapy, psychiatry, and groups but stopped attending recently.  She states that the Pt contacted her this morning to inform her that he was going to [facility] after discharge.  CSW informed Officer [place] that the [facility] and had also refused after the referral had been placed.  She states that she informed the Pt that his options for treatment are to return to [facility] the [place] or be referred to [facility].  She states that she encouraged him to go to the [facility] and apply for the [employer] that is going on currently.  She states that he informed her that he is on a waiting list at the [facility].  She states that prior to the Pt's hospital admission he had not been coming to the probation check-ins and she was "about to list him as an absconder".  She states that she would like to know where he he is sent at discharge.  CSW informed her that it would be to his friend's home or to the [facility].  Officer [place] states that she will follow-up with him after discharge.

## 2022-07-30 NOTE — Progress Notes (Addendum)
   07/29/22 1950  Psych Admission Type (Psych Patients Only)  Admission Status Involuntary  Psychosocial Assessment  Patient Complaints Anxiety;Self-harm thoughts;Nervousness  Eye Contact Fair  Facial Expression Anxious  Affect Anxious;Depressed  Speech Soft;Logical/coherent  Interaction Forwards little  Motor Activity Other (Comment) (WNL)  Appearance/Hygiene Unremarkable  Behavior Characteristics Anxious  Mood Depressed;Anxious;Sad  Thought Process  Coherency WDL  Content WDL  Delusions WDL  Perception WDL  Hallucination None reported or observed  Judgment Limited  Confusion None  Danger to Self  Current suicidal ideation? Denies  Danger to Others  Danger to Others None reported or observed   Patient alert and oriented. Patient presenting with an anxious, depressed affect and mood. Patient denies SI, AVH, and pain. Patient endorses thoughts of wanting to harm self due to bad news he received over the phone. Patient was asked if he would like to discuss feelings of anxiousness and depression related to the news he had received. Patient denied discussing content of news at this time, however, was able to discuss feelings related to wanting to harm. Patient denied a specific harm plan, but endorsed he was becoming increasingly anxious and did not want thoughts to progress. Patient was able to verbally contract for safety at this time and agreed to contact a member of staff before acting on any thoughts of self-harm. PRN hydroxyzine administered per MAR. Support and encouragement provided. Routine safety checks conducted every 15 minutes. Patient remains safe on the unit.

## 2022-07-30 NOTE — BHH Counselor (Signed)
CSW spoke with the Pt who stated that he would not like to attend [facility].  CSW informed the Pt that he has been denied from [facility] due to medical reasons.  The Pt asked if CSW had contacted his probation officer.  CSW informed him that she had and that the PO had indicated that he should attend [facility] or [facility].  She also states that he could benefit from going to the [facility] and applying for the tiny house program that has been occurring in [facility].  CSW informed that the Pt that he could also choose to go back to his friends home or could use the resource packet that was provided to look for a local shelter.  The Pt stated that he would be comfortable going to the [facility].  He asked if he could be discharged today.  CSW informed him that he could not be discharged today but that she would mention his desire to leave to the doctors during morning progression.  CSW will inform the providers of the Pt's requests.

## 2022-07-30 NOTE — BHH Counselor (Signed)
CSW provided the Pt with a packet that contains information including shelter and housing resources, free and reduced price food information, clothing resources, crisis center information, a [employer] card, and suicide prevention information.   

## 2022-07-30 NOTE — Progress Notes (Addendum)
  D. Pt presents with a sad affect/ depressed mood, calm, cooperative behavior. Pt currently denies pain, SI/HI and AVH. A. Labs and vitals monitored. Pt given and educated on medications. Abdominal wound cleansed with sterile saline, redressed with abdominal pad.No redness, pain, drainage noted at site.  Pt supported emotionally and encouraged to express concerns and ask questions.   R. Pt remains safe with 15 minute checks. Will continue POC.      07/30/22 1700  Psych Admission Type (Psych Patients Only)  Admission Status Involuntary  Psychosocial Assessment  Patient Complaints Depression  Eye Contact Brief  Facial Expression Sad  Affect Appropriate to circumstance  Speech Logical/coherent  Interaction Assertive  Motor Activity Other (Comment) (steady gait)  Appearance/Hygiene Unremarkable  Behavior Characteristics Cooperative;Calm  Mood Depressed;Pleasant  Thought Process  Coherency WDL  Content WDL  Delusions None reported or observed  Perception WDL  Hallucination None reported or observed  Judgment Poor  Confusion None  Danger to Self  Current suicidal ideation? Denies  Danger to Others  Danger to Others None reported or observed

## 2022-07-31 DIAGNOSIS — F1994 Other psychoactive substance use, unspecified with psychoactive substance-induced mood disorder: Secondary | ICD-10-CM | POA: Diagnosis not present

## 2022-07-31 LAB — HEMOGLOBIN A1C
Hgb A1c MFr Bld: 4.6 % — ABNORMAL LOW (ref 4.8–5.6)
Mean Plasma Glucose: 85.32 mg/dL

## 2022-07-31 NOTE — Group Note (Signed)
LCSW Group Therapy Note   Group Date: 07/31/2022 Start Time: 1300 End Time: 1400  Type of Therapy and Topic:  Group Therapy:  Safe Spaces    Participation Level: Did Not Attend     Description of Group:   In this group patients will be encouraged to explore there safe spaces whether if it is a person, place, or activity. They will be guided to discuss their thoughts, feelings, and behaviors while being in their safe space. The group will process together ways to identify, explore, and analyze positive aspects within their safe space. Each patient will be challenged to identify what is consider a safe space to them. This group will be process-oriented, with patients participating in exploration of their own safe spaces as well as giving and receiving support and challenge from other group members.   Therapeutic Goals: 1.         Patient will identify person, place, and activity for safe space. 2.         Patient will identify what they would bring to there safe space.  3.         Patient will identify feelings, thought process and emotions while being in there safe space. 4.         Patient will identify what there safe place is, if safe space no longer exist.      Summary of Patient Progress:  Did not attend       Therapeutic Modalities:   Solution Focused Therapy Motivational Interviewing Relapse Therapy  Aram Beecham, LCSWA 07/31/2022  1:56 PM

## 2022-07-31 NOTE — Progress Notes (Signed)
[facility] MD Resident Progress Note  07/31/2022 3:59 PM Joseph [name]  MRN:  [id number] Principal Problem: Psychoactive substance-induced mood disorder (HCC) Diagnosis: Principal Problem:   Psychoactive substance-induced mood disorder (HCC) Active Problems:   PTSD (post-traumatic stress disorder)   Major depressive disorder, recurrent episode (HCC)   Alcohol use disorder   Tobacco use disorder   Stimulant abuse (HCC)  Reason for admission   Joseph [name] is a 26 y.o., male with a past psychiatric history significant for MDD who presents to the [facility] Involuntary from  [facility] Emergency Department for evaluation and management of panic attack in the setting of drug use (UDS+ meth, benzos). (admitted on 07/28/2022, total  LOS: 3 days )  Chart review from last 24 hours   The patient's chart was reviewed and nursing notes were reviewed. The patient's case was discussed in multidisciplinary team meeting.  - Overnight events per chart review: refused zyprexa at night and AM meds.  - Patient did not receive scheduled zyprexa at night med. He also initially refused AM meds  - Patient received PRN atarax x1 med  Information obtained during interview   The patient was seen and evaluated on the unit. On assessment today the patient reports he had trouble falling asleep, reports he kept waking up, "hot flashes." He reports that he thought he took all of his medications yesterday. This morning, he reports he didn't take the medicine because he was tired and didn't feel like getting up but he was willing to take his medications with this Clinical research associate after the interview. He denies changes in appetite. He reports that he feels "a little bit better" today compared to yesterday. He reports he went to group yesterday AM and he learned to stop thinking about the negative thoughts. He denies chest pain, abdominal pain, SOB, HA. He denies auditory or visual hallucinations. He denies thought insertion,  withdrawal, or broadcasting. He denies SI/HI.    Review of Systems  Respiratory:  Negative for shortness of breath.   Cardiovascular:  Negative for chest pain.  Gastrointestinal:  Negative for abdominal pain, constipation, diarrhea, nausea and vomiting.  Neurological:  Negative for headaches.   Objective   Blood pressure 128/88, pulse (!) 105, temperature 98.2 F (36.8 C), temperature source Oral, resp. rate 20, height 5\' 11"  (1.803 m), weight 82.6 kg, SpO2 99 %. Body mass index is 25.38 kg/m. Sleep: 7 hours Current Medications: Current Facility-Administered Medications  Medication Dose Route Frequency Provider Last Rate Last Admin   acetaminophen (TYLENOL) tablet 650 mg  650 mg Oral Q4H PRN , Josephine C, NP       alum & mag hydroxide-simeth (MAALOX/MYLANTA) 200-200-20 MG/5ML suspension 30 mL  30 mL Oral Q6H PRN Welford Roche, Josephine C, NP       amoxicillin-clavulanate (AUGMENTIN) 875-125 MG per tablet 1 tablet  1 tablet Oral Q12H Onuoha, Josephine C, NP   1 tablet at 07/31/22 1011   escitalopram (LEXAPRO) tablet 10 mg  10 mg Oral Daily Onuoha, Josephine C, NP   10 mg at 07/31/22 1011   hydrOXYzine (ATARAX) tablet 25 mg  25 mg Oral TID PRN 08/02/22 C, NP   25 mg at 07/31/22 1011   loperamide (IMODIUM) capsule 2-4 mg  2-4 mg Oral PRN Massengill, 08/02/22, MD       LORazepam (ATIVAN) tablet 1 mg  1 mg Oral Q6H PRN Massengill, Nathan, MD       OLANZapine zydis (ZYPREXA) disintegrating tablet 5 mg  5 mg Oral Q8H  PRN Karie Fetch, MD       And   LORazepam (ATIVAN) tablet 1 mg  1 mg Oral PRN Karie Fetch, MD       And   ziprasidone (GEODON) injection 20 mg  20 mg Intramuscular PRN Karie Fetch, MD       multivitamin with minerals tablet 1 tablet  1 tablet Oral Daily Massengill, Nathan, MD   1 tablet at 07/31/22 1011   nicotine (NICODERM CQ - dosed in mg/24 hours) patch 21 mg  21 mg Transdermal Daily Karie Fetch, MD       OLANZapine (ZYPREXA) tablet 5 mg  5 mg Oral  QHS Karie Fetch, MD       ondansetron [facility]) tablet 4 mg  4 mg Oral Q8H PRN Earney Navy, NP       Rivaroxaban (XARELTO) tablet 15 mg  15 mg Oral BID Onuoha, Josephine C, NP   15 mg at 07/31/22 1011   thiamine (Vitamin B-1) tablet 100 mg  100 mg Oral Daily Massengill, Harrold Donath, MD   100 mg at 07/31/22 1011   traZODone (DESYREL) tablet 50 mg  50 mg Oral QHS PRN Karie Fetch, MD       Labs:  -Hgb 4.6 Lipid panel wnl  CIWA:  CIWA-Ar Total: 0  (1, agitation)  Physical Exam Constitutional:      Appearance: the patient is not toxic-appearing.  Pulmonary:     Effort: Pulmonary effort is normal.  Neurological:     General: No focal deficit present.     Mental Status: the patient is alert and answering questions appropriately.  AIMS:  Facial and Oral Movements Muscles of Facial Expression: None, normal Lips and Perioral Area: None, normal Jaw: None, normal Tongue: None, normal,Extremity Movements Upper (arms, wrists, hands, fingers): None, normal Lower (legs, knees, ankles, toes): None, normal Trunk Movements: None, normal  Neck, shoulders, hips: None, normal Overall Severity Severity of abnormal movements (highest score from questions above): None, normal Incapacitation due to abnormal movements: None, normal Patient's awareness of abnormal movements (rate only patient's report): No Awareness, Dental Status Current problems with teeth and/or dentures?: No Does patient usually wear dentures?: No   No stiffness, cogwheeling, or tremors noted on exam.   Mental Status Exam   Appearance and Grooming: Patient is casually dressed in shirt and pajama pants . The patient has no noticeable scent or odor. His lower abdomen is covered with clean dressing.  Motor activity: The patient's movement speed was normal; his gait was normal. There was no notable abnormal facial movements and no notable abnormal extremity movements. Behavior: The patient appears in no acute distress,  and during the interview, was calm, focused, required minimal redirection, and behaving appropriately to scenario; he was able to follow commands and compliant to requests and made good eye contact. The patient did not appear internally or externally preoccupied. Attitude: Patient's attitude towards the interviewer was cooperative and open. Speech: The patient's speech was clear, fluent, with good articulation, and with appropriately placed inflections. The volume of his speech was normal and normal in quantity. The rate was normal with a normal rhythm. Responses were normal in latency. There were no abnormal patterns in speech. Mood: "A little bit better" Affect: Patient's affect is flat with restricted range and even fluctuations; his affect is congruent with his stated mood. ------------------------------------------------------------------------------------------------------------------------- Thought Content The patient experiences no hallucinations. The patient describes no delusional thoughts; he denies thought insertion, denies thought withdrawal, denies thought interruption, and denies thought broadcasting.  Patient at the time of interview denies active suicidal intent and denies passive suicidal ideation; he denies homicidal intent. Thought Process The patient's thought process is linear and is goal-directed. Insight The patient at the time of interview demonstrates fair insight, as evidenced by ability to identify adaptive and maladaptive coping strategies. Judgement The patient over the past 24 hours demonstrates poor judgement, as evidenced by initially not adhering to medication regimen, however when asked again he is willing to take medication.   Assets: Manufacturing systems engineerCommunication Skills; Desire for Improvement; Physical Health  Treatment Plan Summary: Daily contact with patient to assess and evaluate symptoms and progress in treatment and Medication management Summary   Patient is a 26  year old male with lengthy history with legal system including prison for robbery and jail for assault as well as prior psychiatric hospitalization 3 years ago in [place] where he was diagnosed with MDD and given haldol and seroquel who presented to [facility] ED with a panic attack in the setting of drug use (UDS+meth, benzos), reported suicidal ideation, and auditory hallucinations. Pt reported improved mood this morning and discussed coping strategies he was using. Will continue with decreased Zyprexa given decreased AH and no complaints of PTSD/paranoia related symptoms, suspect his auditory hallucinations are in the setting of substance use.     Diagnoses / Active Problems: Psychoactive substance-induced mood disorder (HCC) Principal Problem:   Psychoactive substance-induced mood disorder (HCC) Active Problems:   PTSD (post-traumatic stress disorder)   Major depressive disorder, recurrent episode (HCC)   Alcohol use disorder   Tobacco use disorder   Stimulant abuse (HCC)  Plan  Safety and Monitoring: -- Involuntary admission to inpatient psychiatric unit for safety, stabilization and treatment -- Daily contact with patient to assess and evaluate symptoms and progress in treatment -- Patient's case to be discussed in multi-disciplinary team meeting -- Observation Level : q15 minute checks -- Vital signs:  q12 hours -- Precautions: suicide, elopement, and assault  2. Medications:  #MDD  #Substance-induced mood disorder  Pt reports improved mood this morning. Will continue Lexapro, suspect depressed mood yesterday was in response to acute stressor. Will continue with Zyprexa QHS given decreased AH and suspected VH in the setting of substance use. Monitor for patient compliance with medications tonight.  -Continue lexapro 10mg  for depressed mood  -Decrease Zyprexa 5 mg QHS tonight (11/17)   #PTSD  Not reporting nightmares at this time so will not start prazosin.    #Alcohol use  disorder  -CIWA  -Ativan as needed    #Methamphetamine use  -will continue to counsel on cessation   #Tobacco use disorder  -- Nicotine patch 21mg /24 hours ordered   PRNs: Tylenol, maalox, atarax, imodium, ativan (CIWA), zofran -agitation protocol: ativan, zyprexa zydis, geodon    Medical  #Recent PE  He will need anticoagulation for at least 3 months.  -continue xarelto 15mg  BID (until 11/23), then take xarelto 20mg  daily    #Resolving PNA  Noted to have WBC 11.5 with productive cough. Afebrile without increased WOB. Recommended to have another 7 day course of Augmentin.  -continue augmentin 875-125 Q12H (11/13-11/20)  The risks/benefits/side-effects/alternatives to the above medication were discussed in detail with the patient and time was given for questions. The patient consents to medication trial. FDA black box warnings, if present, were discussed.  The patient is agreeable with the medication plan, as above. We will monitor the patient's response to pharmacologic treatment, and adjust medications as necessary.  3. Routine and other pertinent labs:  EKG monitoring: QTc: 453 (11/13)  Lab Results:     Latest Ref Rng & Units 07/27/2022    5:28 AM 07/16/2022   11:49 AM 07/12/2022   10:30 AM  CBC  WBC 4.0 - 10.5 K/uL 11.5  10.5  9.7   Hemoglobin 13.0 - 17.0 g/dL 25.8  52.7  78.2   Hematocrit 39.0 - 52.0 % 38.6  36.5  33.9   Platelets 150 - 400 K/uL 394  604  879       Latest Ref Rng & Units 07/27/2022    5:28 AM 07/16/2022   11:49 AM 07/12/2022   10:30 AM  BMP  Glucose 70 - 99 mg/dL 423  536  90   BUN 6 - 20 mg/dL 8  10  14    Creatinine 0.61 - 1.24 mg/dL  1.44  3.15   Sodium 135 - 145 mmol/L 136  136  139   Potassium 3.5 - 5.1 mmol/L 3.4  4.2  3.8   Chloride 98 - 111 mmol/L 100  104  104   CO2 22 - 32 mmol/L 21  24  22    Calcium 8.9 - 10.3 mg/dL 9.8  9.4  9.3    Blood Alcohol level:  Lab Results  Component Value Date   ETH <10 07/27/2022   ETH 245 (H)  06/22/2022   Prolactin: No results found for: "PROLACTIN" Lipid Panel: Lab Results  Component Value Date   CHOL 148 07/30/2022   TRIG 133 07/30/2022   HDL 60 07/30/2022   CHOLHDL 2.5 07/30/2022   VLDL 27 07/30/2022   LDLCALC 61 07/30/2022   HbgA1c: Lab Results  Component Value Date   HGBA1C 4.6 (L) 07/30/2022   TSH: Lab Results  Component Value Date   TSH 2.958 07/27/2022    4. Group Therapy: -- Encouraged patient to participate in unit milieu and in scheduled group therapies  -- Short Term Goals: Ability to identify changes in lifestyle to reduce recurrence of condition will improve, Ability to verbalize feelings will improve, Ability to disclose and discuss suicidal ideas, Ability to demonstrate self-control will improve, Ability to identify and develop effective coping behaviors will improve, Ability to maintain clinical measurements within normal limits will improve, Compliance with prescribed medications will improve, and Ability to identify triggers associated with substance abuse/mental health issues will improve  -- Long Term Goals: Improvement in symptoms so as ready for discharge -- Patient is encouraged to participate in group therapy while admitted to the psychiatric unit. -- We will address other chronic and acute stressors, which contributed to the patient's Psychoactive substance-induced mood disorder (HCC) in order to reduce the risk of self-harm at discharge.  5. Discharge Planning:  -- Social work and case management to assist with discharge planning and identification of hospital follow-up needs prior to discharge. [facility] but denied due to medical reasons. Pt declining and notes from probation officer.  -- Estimated LOS: 5-7 days -- Discharge Concerns: Need to establish a safety plan; Medication compliance and effectiveness -- Discharge Goals: Return home with outpatient referrals for mental health follow-up including medication  management/psychotherapy  I certify that inpatient services furnished can reasonably be expected to improve the patient's condition.   I discussed my assessment, planned testing and intervention for the patient with Dr. 08/01/2022 who agrees with my formulated course of action.  07/29/2022, MD, PGY-1 07/31/2022, 3:59 PM

## 2022-07-31 NOTE — Progress Notes (Addendum)
   07/30/22 2155  Psych Admission Type (Psych Patients Only)  Admission Status Involuntary  Psychosocial Assessment  Patient Complaints None  Eye Contact Brief  Facial Expression Flat  Affect Irritable  Speech Logical/coherent  Interaction Assertive;Forwards little  Motor Activity Other (Comment) (WNL)  Appearance/Hygiene Unremarkable  Behavior Characteristics Guarded;Irritable  Mood Irritable  Thought Process  Coherency WDL  Content WDL  Delusions None reported or observed  Perception WDL  Hallucination None reported or observed  Judgment Poor  Confusion None  Danger to Self  Current suicidal ideation? Denies  Danger to Others  Danger to Others None reported or observed   Patient alert and oriented. Presenting with an irritable affect and mood. Patient denies SI, HI, AVH, and pain. Patient endorses anxiety rating 4/10, with 0 being the least and 10 being the worst. Scheduled medication and PRN hydroxyzine administered to patient, per provider orders. Patient refused scheduled Zyprexa at bed. Support and encouragement provided. Routine safety checks conducted every 15 minutes.Patient verbally contracts for safety. Patient remains safe on the unit.

## 2022-07-31 NOTE — Progress Notes (Signed)
   07/31/22 0700  Sleep  Number of Hours 7

## 2022-07-31 NOTE — Progress Notes (Signed)
D- Patient alert and oriented. Patient affect/mood.reported as improving.  Rates his day 8/10, 10 being the highest. Denies SI, HI, AVH, and pain. Patient states, "I was a little anxious today but that is just because my son had surgery and is still in the hospital, but other than that I'm good." Patient reports that son had throat surgery x2 days ago and is recovering still in the hospital.  A- Scheduled medications administered to patient, per MD orders. Support and encouragement provided.  Routine safety checks conducted every 15 minutes.  Patient informed to notify staff with problems or concerns. R- No adverse drug reactions noted. Patient contracts for safety at this time. Patient compliant with medications and treatment plan. Patient receptive, calm, and cooperative. Patient interacts well with others on the unit.  Patient remains safe at this time.

## 2022-07-31 NOTE — Progress Notes (Signed)
Safety round complete. Patient located in bedroom, asleep, lying supine position. Q15 mins checks will be continued.   

## 2022-07-31 NOTE — Progress Notes (Signed)
   07/31/22 1300  Psych Admission Type (Psych Patients Only)  Admission Status Involuntary  Psychosocial Assessment  Patient Complaints Depression  Eye Contact Brief  Facial Expression Angry  Affect Irritable  Speech Logical/coherent  Interaction Forwards little  Motor Activity Other (Comment)  Appearance/Hygiene Unremarkable  Behavior Characteristics Unwilling to participate  Mood Labile  Thought Process  Coherency WDL  Content WDL  Delusions None reported or observed  Perception WDL  Hallucination None reported or observed  Judgment Poor  Confusion None  Danger to Self  Current suicidal ideation? Denies  Danger to Others  Danger to Others None reported or observed

## 2022-08-01 DIAGNOSIS — F333 Major depressive disorder, recurrent, severe with psychotic symptoms: Secondary | ICD-10-CM | POA: Diagnosis not present

## 2022-08-01 MED ORDER — ESCITALOPRAM OXALATE 20 MG PO TABS
20.0000 mg | ORAL_TABLET | Freq: Every day | ORAL | Status: DC
  Administered 2022-08-01 – 2022-08-03 (×3): 20 mg via ORAL
  Filled 2022-08-01 (×4): qty 1

## 2022-08-01 NOTE — Progress Notes (Signed)
Adult Psychoeducational Group Note  Date:  08/01/2022 Time:  9:50 PM  Group Topic/Focus:  Wrap-Up Group:   The focus of this group is to help patients review their daily goal of treatment and discuss progress on daily workbooks.  Participation Level:  Active  Participation Quality:  Appropriate  Affect:  Appropriate  Cognitive:  Appropriate  Insight: Appropriate  Engagement in Group:  Engaged  Modes of Intervention:  Discussion, Rapport Building, Socialization, and Support  Additional Comments:   Pt attended and participated in the Wrap Up group.Pt rated his day 8/10. Pt reported some progress made toward his goal of staying calm and optimistic. Pt identified walking, remove himself from the situation, deep breathing and sitting alone as effective strategies that help improve mood and daily coping.   Joseph [name] 08/01/2022, 9:50 PM

## 2022-08-01 NOTE — [facility] Group Notes (Signed)
Adult Psychoeducational Group Note  Date:  08/01/2022 Time:  9:28 AM  Group Topic/Focus:  Goals Group:   The focus of this group is to help patients establish daily goals to achieve during treatment and discuss how the patient can incorporate goal setting into their daily lives to aide in recovery.  Participation Level:  Did Not Attend    Joseph [name] 08/01/2022, 9:28 AM

## 2022-08-01 NOTE — Progress Notes (Signed)
[facility] MD Resident Progress Note  08/01/2022 1:09 PM Joseph [name]  MRN:  [id number] Principal Problem: Psychoactive substance-induced mood disorder (HCC) Diagnosis: Principal Problem:   Psychoactive substance-induced mood disorder (HCC) Active Problems:   PTSD (post-traumatic stress disorder)   Major depressive disorder, recurrent episode (HCC)   Alcohol use disorder   Tobacco use disorder   Stimulant abuse (HCC)  Reason for admission   Joseph [name] is a 26 y.o., male with a past psychiatric history significant for MDD who presents to the [facility] Involuntary from  [facility] Emergency Department for evaluation and management of panic attack in the setting of drug use (UDS +meth, benzos) (admitted on 07/28/2022, total  LOS: 4 days )  Chart review from last 24 hours   The patient's chart was reviewed and nursing notes were reviewed. The patient's case was discussed in multidisciplinary team meeting.  - Overnight events per chart review: Took all meds. Did not attend groups. Felt better after talking with child's mother. Stayed in room. Per nursing report this AM, patient continues to refuse meds in the morning.  - Patient took all scheduled meds, initially refused morning meds.   - Patient took PRN atarax med   Information obtained during interview   The patient was seen and evaluated on the unit. On assessment today the patient reports improved sleep today compared to yesterday. He reports he ate dinner, denies changes in his appetite. He reports he went to AA last night and learned about arrogance and pride. He reports he will try to stay away from alcohol, try to stay sober. He initially reports his depressed mood is fine however when discussed his continued refusal of meds he states "I want to isolate in my room, maybe a bit more depressed." He requests to take all of his medications at night. Came to agreement that we could switch to getting his lexapro at 10am. He  denies SI/HI. He denies side effects to the medications.   Review of Systems  Respiratory:  Negative for shortness of breath.   Cardiovascular:  Negative for chest pain.  Gastrointestinal:  Negative for abdominal pain, constipation, diarrhea, nausea and vomiting.  Neurological:  Negative for headaches.   Objective   Blood pressure 112/71, pulse (!) 122, temperature 97.8 F (36.6 C), temperature source Oral, resp. rate 20, height 5\' 11"  (1.803 m), weight 82.6 kg, SpO2 92 %. Body mass index is 25.38 kg/m. Sleep: 5 hours charted  Current Medications: Current Facility-Administered Medications  Medication Dose Route Frequency Provider Last Rate Last Admin   acetaminophen (TYLENOL) tablet 650 mg  650 mg Oral Q4H PRN C, NP       alum & mag hydroxide-simeth (MAALOX/MYLANTA) 200-200-20 MG/5ML suspension 30 mL  30 mL Oral Q6H PRN [name] Byes C, NP       amoxicillin-clavulanate (AUGMENTIN) 875-125 MG per tablet 1 tablet  1 tablet Oral Q12H Onuoha, Josephine C, NP   1 tablet at 08/01/22 1244   escitalopram (LEXAPRO) tablet 20 mg  20 mg Oral Daily 08/03/22, MD   20 mg at 08/01/22 1244   hydrOXYzine (ATARAX) tablet 25 mg  25 mg Oral TID PRN 08/03/22 C, NP   25 mg at 07/31/22 2109   OLANZapine zydis (ZYPREXA) disintegrating tablet 5 mg  5 mg Oral Q8H PRN 2110, MD       And   LORazepam (ATIVAN) tablet 1 mg  1 mg Oral PRN Karie Fetch, MD  And   ziprasidone (GEODON) injection 20 mg  20 mg Intramuscular PRN Karie Fetchhien, Damiya Sandefur, MD       multivitamin with minerals tablet 1 tablet  1 tablet Oral Daily Massengill, Nathan, MD   1 tablet at 07/31/22 1011   nicotine (NICODERM CQ - dosed in mg/24 hours) patch 21 mg  21 mg Transdermal Daily Karie Fetchhien, Altie Savard, MD       OLANZapine [facility](ZYPREXA) tablet 5 mg  5 mg Oral QHS Karie Fetchhien, Grayton Lobo, MD   5 mg at 07/31/22 2110   ondansetron (ZOFRAN) tablet 4 mg  4 mg Oral Q8H PRN Earney Navynuoha, Josephine C, NP       Rivaroxaban  (XARELTO) tablet 15 mg  15 mg Oral BID Onuoha, Josephine C, NP   15 mg at 08/01/22 1245   thiamine (Vitamin B-1) tablet 100 mg  100 mg Oral Daily Massengill, Harrold DonathNathan, MD   100 mg at 07/31/22 1011   traZODone (DESYREL) tablet 50 mg  50 mg Oral QHS PRN Karie Fetchhien, Tomasita Beevers, MD       CIWA:  CIWA-Ar Total: 0     Physical Exam Constitutional:      Appearance: the patient is not toxic-appearing.  Pulmonary:     Effort: Pulmonary effort is normal.  Neurological:     General: No focal deficit present.     Mental Status: the patient is alert and oriented to person, place, and time.   Mental Status Exam   Appearance and Grooming: Patient is casually dressed in tshirt and pants . The patient has no noticeable scent or odor. Motor activity: The patient's movement speed was normal; his gait was normal. There was no notable abnormal facial movements and no notable abnormal extremity movements. Behavior: The patient appears in no acute distress, and during the interview, was focused, required minimal redirection, and behaving appropriately to scenario; he was able to follow commands and noncompliant to requests, as evidenced by refusal to take his morning meds  and made minimal eye contact. He initially spoke with this interview but when discussing meds again, he had covered himself with the blanket in the room and was continuing to refuse to take his meds.  The patient did not appear internally or externally preoccupied. Attitude: Patient's attitude towards the interviewer was open and irritable, as evidenced by continuing to refuse to take morning meds . Speech: The patient's speech was clear, fluent, with good articulation, and with appropriately placed inflections. The volume of his speech was normal and normal in quantity. The rate was normal with a normal rhythm. Responses were normal in latency. There were no abnormal patterns in speech. Mood: "Maybe more depressed" Affect: Patient's affect is flat  with restricted range and even fluctuations; his affect is congruent with his stated mood. ------------------------------------------------------------------------------------------------------------------------- Thought Content The patient experiences auditory hallucinations, specifically, might have heard someone say "you know what's up" before this interviewer entered the room . The patient describes no delusional thoughts; he denies thought insertion, denies thought withdrawal, denies thought interruption, and denies thought broadcasting. Patient at the time of interview denies active suicidal intent and denies passive suicidal ideation; he denies homicidal intent. Thought Process The patient's thought process is linear and is goal-directed. Insight The patient at the time of interview demonstrates fair insight, as evidenced by acknowledgement of substance use disorder/s. Judgement The patient over the past 24 hours demonstrates poor judgement, as evidenced by not adhering to medication regimen.  Assets: Manufacturing systems engineerCommunication Skills; Desire for Improvement; Physical Health  Treatment Plan Summary: Daily contact  with patient to assess and evaluate symptoms and progress in treatment and Medication management Summary   Patient is a 26 year old male with lengthy history with legal system including prison for robbery and jail for assault as well as prior psychiatric hospitalization 3 years ago in [place] where he was diagnosed with MDD and given haldol and seroquel who presented to [facility] ED with a panic attack in the setting of drug use (UDS+meth, benzos), reported suicidal ideation, and auditory hallucinations. Pt reported improved mood this morning and discussed coping strategies he was using. Will continue with decreased Zyprexa given decreased AH and no complaints of PTSD/paranoia related symptoms, suspect his auditory hallucinations are in the setting of substance use. Will increase his lexapro  given his behavior of isolating in his room, continuing to appear depressed, not attending groups.     Diagnoses / Active Problems: Psychoactive substance-induced mood disorder (HCC) Principal Problem:   Psychoactive substance-induced mood disorder (HCC) Active Problems:   PTSD (post-traumatic stress disorder)   Major depressive disorder, recurrent episode (HCC)   Alcohol use disorder   Tobacco use disorder   Stimulant abuse (HCC)  Plan  Safety and Monitoring: -- Involuntary admission to inpatient psychiatric unit for safety, stabilization and treatment -- Daily contact with patient to assess and evaluate symptoms and progress in treatment -- Patient's case to be discussed in multi-disciplinary team meeting -- Observation Level : q15 minute checks -- Vital signs:  q12 hours -- Precautions: suicide, elopement, and assault  2. Medications:  #MDD  #Substance-induced mood disorder  Pt reports depressed mood this morning, continuing to refuse morning meds. Will increase lexapro and change administration time to 10am. Will continue with Zyprexa QHS given decreased AH and suspected VH in the setting of substance use. Continue to monitor for patient compliance with medications. -INCREASE lexapro 20mg  for depressed mood, changed administration time to 10am -Continue Zyprexa 5 mg QHS tonight (11/17)   #PTSD  Not reporting nightmares at this time so will not start prazosin.    #Alcohol use disorder  -CIWA  -Ativan as needed    #Methamphetamine use  -will continue to counsel on cessation   #Tobacco use disorder  -- Nicotine patch 21mg /24 hours ordered   PRNs: Tylenol, maalox, atarax, imodium, ativan (CIWA), zofran -agitation protocol: ativan, zyprexa zydis, geodon    Medical  #Recent PE  He will need anticoagulation for at least 3 months.  -continue xarelto 15mg  BID (until 11/23), then take xarelto 20mg  daily    #Resolving PNA  Noted to have WBC 11.5 with productive cough.  Afebrile without increased WOB. Recommended to have another 7 day course of Augmentin.  -continue augmentin 875-125 Q12H (11/13-11/20)  The risks/benefits/side-effects/alternatives to the above medication were discussed in detail with the patient and time was given for questions. The patient consents to medication trial. FDA black box warnings, if present, were discussed.  The patient is agreeable with the medication plan, as above. We will monitor the patient's response to pharmacologic treatment, and adjust medications as necessary.  3. Routine and other pertinent labs: EKG monitoring: QTc: 453 (11/13) Lab Results:     Latest Ref Rng & Units 07/27/2022    5:28 AM 07/16/2022   11:49 AM 07/12/2022   10:30 AM  CBC  WBC 4.0 - 10.5 K/uL 11.5  10.5  9.7   Hemoglobin 13.0 - 17.0 g/dL [birth year]1975  07/29/2022  13/10/2021   Hematocrit 39.0 - 52.0 % 38.6  36.5  33.9   Platelets 150 -  400 K/uL 394  604  879       Latest Ref Rng & Units 07/27/2022    5:28 AM 07/16/2022   11:49 AM 07/12/2022   10:30 AM  BMP  Glucose 70 - 99 mg/dL 536  644  90   BUN 6 - 20 mg/dL 8  10  14    Creatinine 0.61 - 1.24 mg/dL  0.34  7.42   Sodium 135 - 145 mmol/L 136  136  139   Potassium 3.5 - 5.1 mmol/L 3.4  4.2  3.8   Chloride 98 - 111 mmol/L 100  104  104   CO2 22 - 32 mmol/L 21  24  22    Calcium 8.9 - 10.3 mg/dL 9.8  9.4  9.3    Blood Alcohol level:  Lab Results  Component Value Date   ETH <10 07/27/2022   ETH 245 (H) 06/22/2022   Prolactin: No results found for: "PROLACTIN" Lipid Panel: Lab Results  Component Value Date   CHOL 148 07/30/2022   TRIG 133 07/30/2022   HDL 60 07/30/2022   CHOLHDL 2.5 07/30/2022   VLDL 27 07/30/2022   LDLCALC 61 07/30/2022   HbgA1c: Lab Results  Component Value Date   HGBA1C 4.6 (L) 07/30/2022   TSH: Lab Results  Component Value Date   TSH 2.958 07/27/2022    4. Group Therapy: -- Encouraged patient to participate in unit milieu and in scheduled group therapies  --  Short Term Goals: Ability to identify changes in lifestyle to reduce recurrence of condition will improve, Ability to verbalize feelings will improve, Ability to disclose and discuss suicidal ideas, Ability to demonstrate self-control will improve, Ability to identify and develop effective coping behaviors will improve, Ability to maintain clinical measurements within normal limits will improve, Compliance with prescribed medications will improve, and Ability to identify triggers associated with substance abuse/mental health issues will improve   -- Long Term Goals: Improvement in symptoms so as ready for discharge -- Patient is encouraged to participate in group therapy while admitted to the psychiatric unit. -- We will address other chronic and acute stressors, which contributed to the patient's Psychoactive substance-induced mood disorder (HCC) in order to reduce the risk of self-harm at discharge.  5. Discharge Planning:  -- Social work and case management to assist with discharge planning and identification of hospital follow-up needs prior to discharge -- Estimated LOS: 5-7 days -- Discharge Concerns: Need to establish a safety plan; Medication compliance and effectiveness -- Discharge Goals: Return home with outpatient referrals for mental health follow-up including medication management/psychotherapy  I certify that inpatient services furnished can reasonably be expected to improve the patient's condition.   I discussed my assessment, planned testing and intervention for the patient with Dr. 08/01/2022 who agrees with my formulated course of action.  07/29/2022, MD, PGY-1 08/01/2022, 1:09 PM

## 2022-08-01 NOTE — [facility] Group Notes (Signed)
Psychoeducational Group Note  Date: 07/01/22 Time: 0900-1000    Goal Setting   Purpose of Group: This group helps to provide patients with the steps of setting [name] goal that is specific, measurable, attainable, realistic and time specific. [name] discussion on how we keep ourselves stuck with negative self talk. Homework given for Patients to write 30 positive attributes about themselves.    Participation Level: did not attend  Joseph [name] 

## 2022-08-01 NOTE — Progress Notes (Signed)
Safety round complete. Patient located in bedroom, asleep, lying supine position. Q15 mins checks will be continued.   

## 2022-08-01 NOTE — [facility] Group Notes (Signed)
Psychoeducational Group Note    Date:  1118/2023 Time: 1300-1400    Purpose of Group: . The group focus' on teaching patients on how to identify their needs and their Life Skills:  A group where two lists are made. What people need and what are things that we do that are unhealthy. The lists are developed by the patients and it is explained that we often do the actions that are not healthy to get our list of needs met.  Goal:: to develop the coping skills needed to get their needs met  Participation Level: did not attend  Kamerin Grumbine A  

## 2022-08-01 NOTE — Progress Notes (Signed)
   08/01/22 1100  Psych Admission Type (Psych Patients Only)  Admission Status Involuntary  Psychosocial Assessment  Patient Complaints Depression  Eye Contact Fair  Facial Expression Sad  Affect Labile  Speech Logical/coherent  Interaction Assertive  Motor Activity Other (Comment)  Appearance/Hygiene Unremarkable  Behavior Characteristics Guarded  Mood Apprehensive  Thought Process  Coherency WDL  Content WDL  Delusions None reported or observed  Perception WDL  Hallucination None reported or observed  Judgment Impaired  Confusion None  Danger to Self  Current suicidal ideation? Denies  Danger to Others  Danger to Others None reported or observed

## 2022-08-01 NOTE — Group Note (Signed)
LCSW Group Therapy Note  08/01/2022      Type of Therapy and Topic:  Group Therapy: Gratitude   Description:   Group could not be held by social worker, but licensed RN did provide group.  A handout was given to each patient, with the following information:   Gratitude  "Acknowledging the good that you already have in your life is the foundation for all abundance." - Eckhart Tolle  " 'Enough' is a feast." - Buddhist Proverb  "Gratitude sweetens even the smallest moments."  "It is not joy that makes us grateful; It is gratitude that makes us joyful." - David Steindl-Rast    Put at least one response under each category of something for which you are grateful:  People:  Experiences:  Things:  Places:  Skills:  Other:  Add more responses as you get ideas from other people.   Therapeutic Modalities:   Activity  Kort Stettler J Grossman-Orr, LCSW .  

## 2022-08-02 DIAGNOSIS — F333 Major depressive disorder, recurrent, severe with psychotic symptoms: Secondary | ICD-10-CM | POA: Diagnosis not present

## 2022-08-02 MED ORDER — WHITE PETROLATUM EX OINT
TOPICAL_OINTMENT | CUTANEOUS | Status: AC
  Filled 2022-08-02: qty 5

## 2022-08-02 MED ORDER — SODIUM CHLORIDE 0.9 % IN NEBU
INHALATION_SOLUTION | RESPIRATORY_TRACT | Status: AC
  Filled 2022-08-02: qty 9

## 2022-08-02 NOTE — Progress Notes (Signed)
   08/01/22 2100  Psych Admission Type (Psych Patients Only)  Admission Status Involuntary  Psychosocial Assessment  Patient Complaints None  Eye Contact Fair  Facial Expression Animated  Affect Appropriate to circumstance  Speech Logical/coherent  Interaction Assertive  Motor Activity Slow  Appearance/Hygiene Unremarkable  Behavior Characteristics Appropriate to situation;Cooperative  Mood Pleasant  Thought Process  Coherency WDL  Content WDL  Delusions None reported or observed  Perception WDL  Hallucination None reported or observed  Judgment Poor  Confusion None  Danger to Self  Current suicidal ideation? Denies  Danger to Others  Danger to Others None reported or observed

## 2022-08-02 NOTE — Progress Notes (Addendum)
D. Pt initially presented as  depressed-remained in bed until after lunch refusing to attend groups and to go down for lunch. Pt denied any depression, stating, "I'm just being lazy. Patient observed in the milieu  after lunch, smiling, interacting well with peers and staff.   Pt denied SI/HI and A/VH, and voiced no complaints. .  A. Labs and vitals monitored. Pt given and educated on medications. Pt supported emotionally and encouraged to express concerns and ask questions.   R. Pt remains safe with 15 minute checks. Will continue POC.

## 2022-08-02 NOTE — Progress Notes (Signed)
[facility]  08/02/2022 11:18 AM Alferd ApaRobert Brandvold  MRN:  [id number] Principal Problem: Psychoactive substance-induced mood disorder (HCC) Diagnosis: Principal Problem:   Psychoactive substance-induced mood disorder (HCC) Active Problems:   PTSD (post-traumatic stress disorder)   Major depressive disorder, recurrent episode (HCC)   Alcohol use disorder   Tobacco use disorder   Stimulant abuse (HCC)  Reason for admission   Alferd ApaRobert Crumbley is a 26 y.o., male with a past psychiatric history significant for MDD who [facility] for evaluation and management of panic attack in the setting of drug use (UDS +meth, benzos) (admitted on 07/28/2022, total  LOS: 5 days )  Chart review from last 24 hours   The patient's chart was reviewed and nursing notes were reviewed. The patient's case was discussed in multidisciplinary team meeting.  Patient receiving all his scheduled medications with no issues noted except refusing multivitamin and NicoDerm patch, last as needed Atarax was given on 11/17, as needed trazodone given last night 11/18  Information obtained during interview   Patient was evaluated in his room, he presents lying down in bed but responding to calling his name, answering questions in linear manner and participating fairly well in interview, he denies feeling depressed or anxious, denies SI HI or AVH but appears depressed, denies side effect of medication since titrated Lexapro yesterday and agrees to continue with compliance during hospital stay and after he leaves.  He denies feeling hopeless or helpless or wishing self dead.  He denies symptoms consistent with mania or PTSD.  He denies any craving to illicit drugs and agrees to comply with recommendation to abstain from illicit drugs after discharge.  When asking patient why he is spending more time in his room he reports that he likes to rest but denied  it is related to feeling depressed, reports attended 1 group yesterday and agrees to attend more groups today, will follow.  Review of Systems  Respiratory:  Negative for shortness of breath.   Cardiovascular:  Negative for chest pain.  Gastrointestinal:  Negative for abdominal pain, constipation, diarrhea, nausea and vomiting.  Neurological:  Negative for headaches.   Objective   Blood pressure 105/64, pulse (!) 108, temperature 98.4 F (36.9 C), temperature source Oral, resp. rate 20, height 5\' 11"  (1.803 m), weight 82.6 kg, SpO2 100 %. Body mass index is 25.38 kg/m. Sleep: 5 hours charted  Current Medications: Current Facility-Administered Medications  Medication Dose Route Frequency Provider Last Rate Last Admin   acetaminophen (TYLENOL) tablet 650 mg  650 mg Oral Q4H PRN [name] Byesnuoha, Josephine C, NP       alum & mag hydroxide-simeth (MAALOX/MYLANTA) 200-200-20 MG/5ML suspension 30 mL  30 mL Oral Q6H PRN [name] Byesnuoha, Josephine C, NP       amoxicillin-clavulanate (AUGMENTIN) 875-125 MG per tablet 1 tablet  1 tablet Oral Q12H Onuoha, Josephine C, NP   1 tablet at 08/02/22 0900   escitalopram (LEXAPRO) tablet 20 mg  20 mg Oral Daily Karie Fetchhien, Stephanie, MD   20 mg at 08/02/22 0900   hydrOXYzine (ATARAX) tablet 25 mg  25 mg Oral TID PRN [name] Byesnuoha, Josephine C, NP   25 mg at 07/31/22 2109   OLANZapine zydis (ZYPREXA) disintegrating tablet 5 mg  5 mg Oral Q8H PRN Karie Fetchhien, Stephanie, MD       And   LORazepam (ATIVAN) tablet 1 mg  1 mg Oral PRN Karie Fetchhien, Stephanie, MD  And   ziprasidone (GEODON) injection 20 mg  20 mg Intramuscular PRN Karie Fetch, MD       multivitamin with minerals tablet 1 tablet  1 tablet Oral Daily Massengill, Nathan, MD   1 tablet at 07/31/22 1011   nicotine (NICODERM CQ - dosed in mg/24 hours) patch 21 mg  21 mg Transdermal Daily Karie Fetch, MD       OLANZapine [facility]) tablet 5 mg  5 mg Oral QHS Karie Fetch, MD   5 mg at 08/01/22 2102   ondansetron (ZOFRAN) tablet  4 mg  4 mg Oral Q8H PRN Earney Navy, NP       Rivaroxaban (XARELTO) tablet 15 mg  15 mg Oral BID Onuoha, Josephine C, NP   15 mg at 08/02/22 0900   thiamine (Vitamin B-1) tablet 100 mg  100 mg Oral Daily Massengill, Harrold Donath, MD   100 mg at 08/02/22 0915   traZODone (DESYREL) tablet 50 mg  50 mg Oral QHS PRN Karie Fetch, MD   50 mg at 08/01/22 2101   CIWA:  CIWA-Ar Total: 0     Physical Exam Constitutional:      Appearance: the patient is not toxic-appearing.  Pulmonary:     Effort: Pulmonary effort is normal.  Neurological:     General: No focal deficit present.     Mental Status: the patient is alert and oriented to person, place, and time.   Mental Status Exam   Appearance and Grooming: Appears of stated age, casually dressed and groomed, with an average hygiene Motor activity: Mild psychomotor retardation noted Behavior: Cooperative in general but seems withdrawn Attitude: Withdrawn but answering questions in linear manner and participating in interview Speech: Within normal, fluent Mood: Appears at least mildly dysphoric but denies feeling depressed Affect: Restricted sad affect ------------------------------------------------------------------------------------------------------------------------- Thought Content Denies SI HI or AVH, does not appear responding to internal stimuli Thought Process Linear and goal-directed yet concrete Insight Fair, improved Judgement Fair, improved  Assets: Communication Skills; Desire for Improvement; Physical Health  Treatment Plan Summary: Daily contact with patient to assess and evaluate symptoms and progress in treatment and Medication management Summary   Patient is a 26 year old male with lengthy history with legal system including prison for robbery and jail for assault as well as prior psychiatric hospitalization 3 years ago in [place] where he was diagnosed with MDD and given haldol and seroquel who presented to  [facility] ED with a panic attack in the setting of drug use (UDS+meth, benzos), reported suicidal ideation, and auditory hallucinations. Pt reported improved mood this morning and discussed coping strategies he was using. Will continue with decreased Zyprexa given decreased AH and no complaints of PTSD/paranoia related symptoms, suspect his auditory hallucinations are in the setting of substance use.  Lexapro was increased on 11/18 for ongoing depressed mood. Diagnoses / Active Problems: Psychoactive substance-induced mood disorder (HCC) Principal Problem:   Psychoactive substance-induced mood disorder (HCC) Active Problems:   PTSD (post-traumatic stress disorder)   Major depressive disorder, recurrent episode (HCC)   Alcohol use disorder   Tobacco use disorder   Stimulant abuse (HCC)  Plan  Safety and Monitoring: -- Involuntary admission to inpatient psychiatric unit for safety, stabilization and treatment -- Daily contact with patient to assess and evaluate symptoms and progress in treatment -- Patient's case to be discussed in multi-disciplinary team meeting -- Observation Level : q15 minute checks -- Vital signs:  q12 hours -- Precautions: suicide, elopement, and assault  2. Medications:  #  MDD  #Substance-induced mood disorder  Pt reports depressed mood this morning, continuing to refuse morning meds. Will increase lexapro and change administration time to 10am. Will continue with Zyprexa QHS given decreased AH and suspected VH in the setting of substance use. Continue to monitor for patient compliance with medications. -Continue lexapro 20mg  for depressed mood, dose was titrated to 20 mg on 11/18 -Continue Zyprexa 5 mg QHS tonight (11/17)   #PTSD  Patient denies any current symptoms, to follow   #Alcohol use disorder  -CIWA  -Ativan as needed    #Methamphetamine use  -will continue to counsel on cessation   #Tobacco use disorder  -- Nicotine patch 21mg /24 hours ordered    PRNs: Tylenol, maalox, atarax, imodium, ativan (CIWA), zofran -agitation protocol: ativan, zyprexa zydis, geodon    Medical  #Recent PE  He will need anticoagulation for at least 3 months.  -continue xarelto 15mg  BID (until 11/23), then take xarelto 20mg  daily    #Resolving PNA  Noted to have WBC 11.5 with productive cough. Afebrile without increased WOB. Recommended to have another 7 day course of Augmentin.  -continue augmentin 875-125 Q12H (11/13-11/20)  The risks/benefits/side-effects/alternatives to the above medication were discussed in detail with the patient and time was given for questions. The patient consents to medication trial. FDA black box warnings, if present, were discussed.  The patient is agreeable with the medication plan, as above. We will monitor the patient's response to pharmacologic treatment, and adjust medications as necessary.  3. Routine and other pertinent labs: EKG monitoring: QTc: 453 (11/13) Lab Results:     Latest Ref Rng & Units 07/27/2022    5:28 AM 07/16/2022   11:49 AM 07/12/2022   10:30 AM  CBC  WBC 4.0 - 10.5 K/uL 11.5  10.5  9.7   Hemoglobin 13.0 - 17.0 g/dL [birth year]1975  07/29/2022  13/10/2021   Hematocrit 39.0 - 52.0 % 38.6  36.5  33.9   Platelets 150 - 400 K/uL 394  604  879       Latest Ref Rng & Units 07/27/2022    5:28 AM 07/16/2022   11:49 AM 07/12/2022   10:30 AM  BMP  Glucose 70 - 99 mg/dL 67.1  07/29/2022  90   BUN 6 - 20 mg/dL 8  10  14    Creatinine 0.61 - 1.24 mg/dL 13/10/2021  07/14/2022  245   Sodium 135 - 145 mmol/L 136  136  139   Potassium 3.5 - 5.1 mmol/L 3.4  4.2  3.8   Chloride 98 - 111 mmol/L 100  104  104   CO2 22 - 32 mmol/L 21  24  22    Calcium 8.9 - 10.3 mg/dL 9.8  9.4  9.3    Blood Alcohol level:  Lab Results  Component Value Date   ETH <10 07/27/2022   ETH 245 (H) 06/22/2022   Prolactin: No results found for: "PROLACTIN" Lipid Panel: Lab Results  Component Value Date   CHOL 148 07/30/2022   TRIG 133 07/30/2022   HDL 60 07/30/2022    CHOLHDL 2.5 07/30/2022   VLDL 27 07/30/2022   LDLCALC 61 07/30/2022   HbgA1c: Lab Results  Component Value Date   HGBA1C 4.6 (L) 07/30/2022   TSH: Lab Results  Component Value Date   TSH 2.958 07/27/2022    4. Group Therapy: -- Encouraged patient to participate in unit milieu and in scheduled group therapies  -- Short Term Goals: Ability to identify changes in lifestyle to reduce  recurrence of condition will improve, Ability to verbalize feelings will improve, Ability to disclose and discuss suicidal ideas, Ability to demonstrate self-control will improve, Ability to identify and develop effective coping behaviors will improve, Ability to maintain clinical measurements within normal limits will improve, Compliance with prescribed medications will improve, and Ability to identify triggers associated with substance abuse/mental health issues will improve   -- Long Term Goals: Improvement in symptoms so as ready for discharge -- Patient is encouraged to participate in group therapy while admitted to the psychiatric unit. -- We will address other chronic and acute stressors, which contributed to the patient's Psychoactive substance-induced mood disorder (HCC) in order to reduce the risk of self-harm at discharge.  5. Discharge Planning:  -- Social work and case management to assist with discharge planning and identification of hospital follow-up needs prior to discharge -- Estimated LOS: 5-7 days -- Discharge Concerns: Need to establish a safety plan; Medication compliance and effectiveness -- Discharge Goals: Return home with outpatient referrals for mental health follow-up including medication management/psychotherapy  Macoy Rodwell Abbott Pao, MD, 08/02/2022, 11:18 AM

## 2022-08-02 NOTE — Progress Notes (Signed)
Adult Psychoeducational Group Note  Date:  08/02/2022 Time:  9:43 PM  Group Topic/Focus:  Wrap-Up Group:   The focus of this group is to help patients review their daily goal of treatment and discuss progress on daily workbooks.  Participation Level:  Active  Participation Quality:  Appropriate  Affect:  Appropriate  Cognitive:  Appropriate  Insight: Appropriate  Engagement in Group:  Engaged  Modes of Intervention:  Discussion  Additional Comments:   Pt states that he had a good day. He was able to receive some good news about his son getting out of the hospital today. Pt is concerned about getting good sleep and was encouraged to speak to his nurse about his options. Pt denies everything.   Vevelyn Pat 08/02/2022, 9:43 PM

## 2022-08-02 NOTE — BHH Group Notes (Signed)
Adult Psychoeducational Group Note Date:  08/02/2022 Time:  0900-1000 Group Topic/Focus: PROGRESSIVE RELAXATION. A group where deep breathing is taught and tensing and relaxation muscle groups is used. Imagery is used as well.  Pts are asked to imagine 3 pillars that hold them up when they are not able to hold themselves up and to share that with the group.   Participation Level:  did not attend  : Shaketha Jeon A   

## 2022-08-02 NOTE — Group Note (Signed)
LCSW Group Therapy  Type of Therapy and Topic:  Group Therapy: Thoughts, Feelings, and Actions  Participation Level:  Did Not Attend   Description of Group:   In this group, each patient discussed their previous experiencing and understanding of overthinking, identifying the harmful impact on their lives. As a group, each patient was introduced to the basic concepts of Cognitive Behavioral Therapy: that thoughts, feelings, and actions are all connected and influence one another. They were given examples of how overthinking can affect our feelings, actions, and vise versa. The group was then asked to analyze how overthinking was harmful and brainstorm alternative thinking patterns/reactions to the example situation. Then, each group member filled out and identified their own example situation in which a problem situation caused their thoughts, feelings, and actions to be negatively impacted; they were asked to come up with 3 new (more adaptive/positive) thoughts that led to 3 new feelings and actions.  Therapeutic Goals: Patients will review and discuss their past experience with overthinking. Patients will learn the basics of the CBT model through group-led examples.. Patients will identify situations where they may have negative thoughts, feelings, or actions and will then reframe the situation using more positive thoughts to react differently.  Summary of Patient Progress:  Did not attend  Therapeutic Modalities:   Cognitive Behavioral Therapy Mindfulness  Beatris Si, LCSW 08/02/2022  10:54 AM

## 2022-08-02 NOTE — BHH Group Notes (Signed)
Adult Psychoeducational Group  Date:  08/02/2022 Time:  1300-1400  Group Topic/Focus: Continuation of the group from Saturday. Looking at the lists that were created and talking about what needs to be done with the homework of 30 positives about themselves.                                     Talking about taking their power back and helping themselves to develop a positive self esteem.      Participation Quality:  did not attend  Mitsuo Budnick A   

## 2022-08-03 DIAGNOSIS — F1994 Other psychoactive substance use, unspecified with psychoactive substance-induced mood disorder: Secondary | ICD-10-CM | POA: Diagnosis not present

## 2022-08-03 MED ORDER — NICOTINE 21 MG/24HR TD PT24: 21.0000 mg | MEDICATED_PATCH | Freq: Every day | TRANSDERMAL | 0 refills | Status: DC

## 2022-08-03 MED ORDER — OLANZAPINE 5 MG PO TABS: 5.0000 mg | ORAL_TABLET | Freq: Every day | ORAL | 0 refills | Status: DC

## 2022-08-03 MED ORDER — RIVAROXABAN 15 MG PO TABS: 15.0000 mg | ORAL_TABLET | Freq: Two times a day (BID) | ORAL | 0 refills | Status: DC

## 2022-08-03 MED ORDER — ESCITALOPRAM OXALATE 20 MG PO TABS: 20.0000 mg | ORAL_TABLET | Freq: Every day | ORAL | 0 refills | Status: DC

## 2022-08-03 MED ORDER — RIVAROXABAN 20 MG PO TABS: 20.0000 mg | ORAL_TABLET | Freq: Every day | ORAL | Status: DC

## 2022-08-03 MED ORDER — RIVAROXABAN 20 MG PO TABS: 20.0000 mg | ORAL_TABLET | Freq: Every day | ORAL | 0 refills | Status: DC

## 2022-08-03 MED ORDER — HYDROXYZINE HCL 25 MG PO TABS: 25.0000 mg | ORAL_TABLET | Freq: Three times a day (TID) | ORAL | 0 refills | Status: DC | PRN

## 2022-08-03 NOTE — Discharge Instructions (Addendum)
-  Follow-up with your outpatient psychiatric provider -instructions on appointment date, time, and address (location) are provided to you in discharge paperwork.  -Take your psychiatric medications as prescribed at discharge - instructions are provided to you in the discharge paperwork  -Follow-up with outpatient primary care doctor and other specialists -for management of preventative medicine and any chronic medical disease. You are getting 3 days plus 1 month supply of xarelto - please f/u with your outpatient provider to have this prescription monitored and refilled.   -Recommend abstinence from alcohol, tobacco, and other illicit drug use at discharge.   -If your psychiatric symptoms recur, worsen, or if you have side effects to your psychiatric medications, call your outpatient psychiatric provider, 911, [phone] or go to the nearest emergency department.  -If suicidal thoughts recur, call your outpatient psychiatric provider, 911, [phone] or go to the nearest emergency department.   Information on my medicine - XARELTO (rivaroxaban)   WHY WAS XARELTO PRESCRIBED FOR YOU? Xarelto was prescribed to treat blood clots that may have been found in the veins of your legs (deep vein thrombosis) or in your lungs (pulmonary embolism) and to reduce the risk of them occurring again.  What do you need to know about Xarelto? The starting dose is one 15 mg tablet taken TWICE daily with food for the FIRST 21 DAYS then on 08/07/2022  the dose is changed to one 20 mg tablet taken ONCE A DAY with your evening meal.  DO NOT stop taking Xarelto without talking to the health care provider who prescribed the medication.  Refill your prescription for 20 mg tablets before you run out.  After discharge, you should have regular check-up appointments with your healthcare provider that is prescribing your Xarelto.  In the future your dose may need to be changed if your kidney function changes by a significant  amount.  What do you do if you miss a dose? If you are taking Xarelto TWICE DAILY and you miss a dose, take it as soon as you remember. You may take two 15 mg tablets (total 30 mg) at the same time then resume your regularly scheduled 15 mg twice daily the next day.  If you are taking Xarelto ONCE DAILY and you miss a dose, take it as soon as you remember on the same day then continue your regularly scheduled once daily regimen the next day. Do not take two doses of Xarelto at the same time.   Important Safety Information Xarelto is a blood thinner medicine that can cause bleeding. You should call your healthcare provider right away if you experience any of the following: Bleeding from an injury or your nose that does not stop. Unusual colored urine (red or dark brown) or unusual colored stools (red or black). Unusual bruising for unknown reasons. A serious fall or if you hit your head (even if there is no bleeding).  Some medicines may interact with Xarelto and might increase your risk of bleeding while on Xarelto. To help avoid this, consult your healthcare provider or pharmacist prior to using any new prescription or non-prescription medications, including herbals, vitamins, non-steroidal anti-inflammatory drugs (NSAIDs) and supplements.  This website has more information on Xarelto: VisitDestination.com.br.

## 2022-08-03 NOTE — Progress Notes (Signed)
Pt discharged to lobby with taxi voucher. Pt was stable and appreciative at that time. All papers, samples, and prescriptions were given and valuables returned. Suicide safety plan completed by patient, copy placed in chart,  and safety plan reviewed with nurse. Verbal understanding expressed. Denies SI/HI and A/VH. Pt given opportunity to express concerns and ask questions.

## 2022-08-03 NOTE — Progress Notes (Signed)
   08/03/22 0545  Sleep  Number of Hours 8

## 2022-08-03 NOTE — BHH Group Notes (Signed)
Spiritual care group on grief and loss facilitated by chaplain Dyanne Carrel, [facility]  Group Goal:  Support / Education around grief and loss  Members engage in facilitated group support and psycho-social education.  Group Description:  Following introductions and group rules, group members engaged in facilitated group dialog and support around topic of loss, with particular support around experiences of loss in their lives. Group Identified types of loss (relationships / self / things) and identified patterns, circumstances, and changes that precipitate losses. Reflected on thoughts / feelings around loss, normalized grief responses, and recognized variety in grief experience. Group noted Worden's four tasks of grief in discussion.  Group drew on Adlerian / Rogerian, narrative, MI,  Patient Progress: Darryel was present for the beginning of group before he d/c.  He remained engaged in the group conversation for the duration of his time in the group.  [address] Bcc Pager, [phone]

## 2022-08-03 NOTE — Progress Notes (Signed)
  [facility] Adult Case Management Discharge Plan :  Will you be returning to the same living situation after discharge:  No. IRC At discharge, do you have transportation home?: Yes,  Taxi (voucher signed and sent to Medical Records) Do you have the ability to pay for your medications: Yes,  Community Support  Release of information consent forms completed and in the chart;  Patient's signature needed at discharge.  Patient to Follow up at:  Follow-up Information     [facility]. Go to.   Specialty: Behavioral Health Why: You may go to this provider for an assessment, to obtain medication management services, on Mondays at 7:30 am.  Services are provided on a first come, first served basis.  Therapy services are also available. Contact information: [address] [place] [address] [phone]        [place]. Go to.   Specialty: Professional Counselor Why: You may go to this provider to re-establish care for therapy services on Monday through Fridays, from 9:00 am to 1:00 pm. Contact information: [address] [place] [address] [phone]                 Next level of care provider has access to [facility] Link:yes  Safety Planning and Suicide Prevention discussed: Yes,  with patient, mother, and probation officer      Has patient been referred to the Quitline?: Patient refused referral  Patient has been referred for addiction treatment: Pt. refused referral, Pt referred to [facility] but was denied due to medical reasons.  No other inpatient options at this time.    Aram Beecham, LCSWA 08/03/2022, 9:17 AM
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
## 2022-08-03 NOTE — Progress Notes (Signed)
   08/02/22 2100  Psych Admission Type (Psych Patients Only)  Admission Status Involuntary  Psychosocial Assessment  Patient Complaints None  Eye Contact Fair  Facial Expression Flat  Affect Appropriate to circumstance  Speech Logical/coherent  Interaction Assertive  Motor Activity Slow  Appearance/Hygiene Disheveled  Behavior Characteristics Cooperative  Mood Depressed  Thought Process  Coherency WDL  Content WDL  Delusions WDL  Perception WDL  Hallucination None reported or observed  Judgment Poor  Confusion None  Danger to Self  Current suicidal ideation? Denies  Danger to Others  Danger to Others None reported or observed

## 2022-08-03 NOTE — Progress Notes (Signed)
D. Pt presented less groggy this am- bright affect upon approach. Pt reported that he was looking forward to being discharged. Pt currently denies SI/HI and AVH  A. Labs and vitals monitored. Pt given and educated on medications. Pt supported emotionally and encouraged to express concerns and ask questions.   R. Pt remains safe with 15 minute checks. Will continue POC.

## 2022-08-03 NOTE — [facility] Suicide Risk Assessment (Signed)
[facility] Discharge Suicide Risk Assessment  Principal Problem: Psychoactive substance-induced mood disorder [facility]) Discharge Diagnoses: Principal Problem:   Psychoactive substance-induced mood disorder (HCC) Active Problems:   PTSD (post-traumatic stress disorder)   Major depressive disorder, recurrent episode (HCC)   Alcohol use disorder   Tobacco use disorder   Stimulant abuse (HCC)   Reason for Admission:  Patient is a 26 year old male with lengthy history with legal system including prison for robbery and jail for assault as well as prior psychiatric hospitalization 3 years ago in [place] where he was diagnosed with MDD and given haldol and seroquel who presented to [facility] ED with a panic attack in the setting of drug use (UDS+meth, benzos), reported suicidal ideation, and auditory hallucinations.    Hospital Summary During the patient's hospitalization, patient had extensive initial psychiatric evaluation, and follow-up psychiatric evaluations every day.   Psychiatric diagnoses provided upon initial assessment:  Principal Problem:   Psychoactive substance-induced mood disorder (HCC) Active Problems:   PTSD (post-traumatic stress disorder)   Major depressive disorder, recurrent episode (HCC)   Alcohol use disorder   Tobacco use disorder   Stimulant abuse (HCC)  The following medications were managed:   Upon admission, the following medications were changed / started / discontinued: -continue lexapro 10mg  for depressed mood  -continue zyprexa 5mg  BID for mood stability and auditory hallucinations    During the patient's stay, the following medications were changed / started / discontinued, with final adjustments by discharge: -- lexapro increased to 20mg  for depressed mood -- zyprexa decreased to 5mg  QHS for mood stability and suspected substance-induced hallucinations    Patient's care was discussed during the interdisciplinary team meeting every day during the  hospitalization.   The patient denies any side effects to prescribed psychiatric medication.   Patient is a 26 year old male with lengthy history with legal system including prison for robbery and jail for assault as well as prior psychiatric hospitalization 3 years ago in where he was diagnosed with MDD and given haldol and seroquel who presented to ED with a panic attack in the setting of drug use (UDS+meth, benzos), reported suicidal ideation, and auditory hallucinations.    Gradually, patient started adjusting to milieu. The patient was evaluated each day by a clinical provider to ascertain response to treatment. Improvement was noted by the patient's report of decreasing symptoms, improved sleep and appetite, affect, medication tolerance, behavior, and participation in unit programming.  Patient was asked each day to complete a self inventory noting mood, mental status, pain, new symptoms, anxiety and concerns.     Symptoms were reported as significantly decreased or resolved completely by discharge.    On day of discharge, the patient reports that their mood is stable. The patient denied having suicidal thoughts for more than 48 hours prior to discharge.  Patient denies having homicidal thoughts.  Patient denies having auditory hallucinations.  Patient denies any visual hallucinations or other symptoms of psychosis. The patient was motivated to continue taking medication with a goal of continued improvement in mental health.    The patient reports their target psychiatric symptoms of depression and hallucinations responded well to the psychiatric medications, and the patient reports overall benefit other psychiatric hospitalization. Supportive psychotherapy was provided to the patient. The patient also participated in regular group therapy while hospitalized. Coping skills, problem solving as well as relaxation therapies were also part of the unit programming.   Labs were  reviewed with the patient, and abnormal  results were discussed with the patient.   The patient is able to verbalize their individual safety plan to this provider.   # It is recommended to the patient to continue psychiatric medications as prescribed, after discharge from the hospital.     # It is recommended to the patient to follow up with your outpatient psychiatric provider and PCP.   # It was discussed with the patient, the impact of alcohol, drugs, tobacco have been there overall psychiatric and medical wellbeing, and total abstinence from substance use was recommended to the patient.   # Prescriptions provided or sent directly to preferred pharmacy at discharge. Patient agreeable to plan. Given opportunity to ask questions. Appears to feel comfortable with discharge.    # In the event of worsening symptoms, the patient is instructed to call the crisis hotline, 911 and or go to the nearest ED for appropriate evaluation and treatment of symptoms. To follow-up with primary care provider for other medical issues, concerns and or health care needs   # Patient was discharged to the [facility] with a plan to follow up as noted below.  Total Time spent with patient: 30 minutes  Mental Status Exam: Appearance and Grooming: Appears of stated age, casually dressed and groomed, with an average hygiene Motor activity: Mild psychomotor retardation noted Behavior: Cooperative in general but seems withdrawn Attitude: Withdrawn but answering questions in linear manner and participating in interview Speech: Within normal, fluent Mood: Less depressed and anxious  Affect: Restricted affect ------------------------------------------------------------------------------------------------------------------------- Thought Content Denies SI, HI or AVH, does not appear responding to internal stimuli Thought Process Linear and goal-directed yet concrete Insight Fair, improved Judgement Fair,  improved  Sleep  Sleep: 8 hours  Physical Exam Constitutional:      Appearance: the patient is not toxic-appearing.  Pulmonary:     Effort: Pulmonary effort is normal.  Neurological:     General: No focal deficit present.     Mental Status: the patient is alert and oriented to person, place, and time.  Abdomen:     Dressing in place   Review of Systems  Respiratory:  Negative for shortness of breath.   Cardiovascular:  Negative for chest pain.  Gastrointestinal:  Negative for abdominal pain, constipation, diarrhea, nausea and vomiting.  Neurological:  Negative for headaches.   Blood pressure 120/81, pulse 87, temperature 98.4 F (36.9 C), temperature source Oral, resp. rate 14, height 5\' 11"  (1.803 m), weight 82.6 kg, SpO2 100 %. Body mass index is 25.38 kg/m.  Mental Status Per Nursing Assessment::   On Admission:  NA  Demographic Factors:  Male  Loss Factors: NA  Historical Factors: Impulsivity  Risk Reduction Factors:   Responsible for children under 77 years of age  Continued Clinical Symptoms:  Depression:   Comorbid alcohol abuse/dependence  Cognitive Features That Contribute To Risk:  Closed-mindedness    Suicide Risk:  Acute Risk: Mild: There are no identifiable suicide plans, no associated intent, mild dysphoria and related symptoms, good self-control (both objective and subjective assessment), few other risk factors, and identifiable protective factors, including available and accessible social support.   Follow-up Information     Services, [facility] Follow up.   Why: Referral made Contact information: [address] [place] [phone]         [facility]. Go to.   Specialty: Behavioral Health Why: You may go to this provider for an assessment, to obtain medication management services, on Mondays at 7:30  am.  Services are provided on a first come, first served basis.  Therapy services  are also available. Contact information: [address] [place] [address] [phone]        [facility]. Go to.   Specialty: Professional Counselor Why: You may go to this provider to re-establish care for therapy services on Monday through Fridays, from 9:00 am to 1:00 pm. Contact information: [address] [place] [address] [phone]                 Plan Of Care/Follow-up recommendations:  Activity: as tolerated  Diet: heart healthy  Other: -Follow-up with your outpatient psychiatric provider -instructions on appointment date, time, and address (location) are provided to you in discharge paperwork.  -Take your psychiatric medications as prescribed at discharge - instructions are provided to you in the discharge paperwork  -Follow-up with outpatient primary care doctor and other specialists -for management of chronic medical disease, including:  Pulmonary embolism:  -Continue xarelto 15 BID, STOP 11/22.  -START 11/23 take xarelto 20mg  once daily. We are providing a 30 day prescription for xarelto, you will need to take xarelto for at least another month. Please follow-up with your PCP for additional xarelto.   -Testing: Follow-up with outpatient provider for abnormal lab results: N/A  -Recommend abstinence from alcohol, tobacco, and other illicit drug use at discharge.   -If your psychiatric symptoms recur, worsen, or if you have side effects to your psychiatric medications, call your outpatient psychiatric provider, 911, [phone] or go to the nearest emergency department.  -If suicidal thoughts recur, call your outpatient psychiatric provider, 911, 988 or go to the nearest emergency department.  , MD, PGY-1 08/03/2022, 6:46 AM
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
## 2022-08-12 ENCOUNTER — Other Ambulatory Visit: Payer: Self-pay

## 2022-08-12 ENCOUNTER — Emergency Department (HOSPITAL_COMMUNITY): Payer: Self-pay

## 2022-08-12 ENCOUNTER — Encounter (HOSPITAL_COMMUNITY): Payer: Self-pay

## 2022-08-12 ENCOUNTER — Emergency Department (HOSPITAL_COMMUNITY)
Admission: EM | Admit: 2022-08-12 | Discharge: 2022-08-12 | Disposition: A | Discharge status: Other Acute Inpatient Hospital | Payer: Self-pay | Attending: Emergency Medicine | Admitting: Emergency Medicine

## 2022-08-12 ENCOUNTER — Inpatient Hospital Stay: Payer: Self-pay | Admitting: Physician Assistant

## 2022-08-12 ENCOUNTER — Other Ambulatory Visit (HOSPITAL_COMMUNITY)
Admission: EM | Admit: 2022-08-12 | Discharge: 2022-08-20 | Disposition: A | Discharge status: Home / Self Care | Payer: No Payment, Other | Attending: Psychiatry | Admitting: Psychiatry

## 2022-08-12 DIAGNOSIS — F1994 Other psychoactive substance use, unspecified with psychoactive substance-induced mood disorder: Secondary | ICD-10-CM | POA: Diagnosis present

## 2022-08-12 DIAGNOSIS — F17210 Nicotine dependence, cigarettes, uncomplicated: Secondary | ICD-10-CM | POA: Diagnosis not present

## 2022-08-12 DIAGNOSIS — F333 Major depressive disorder, recurrent, severe with psychotic symptoms: Secondary | ICD-10-CM | POA: Insufficient documentation

## 2022-08-12 DIAGNOSIS — R45851 Suicidal ideations: Secondary | ICD-10-CM

## 2022-08-12 DIAGNOSIS — Z9151 Personal history of suicidal behavior: Secondary | ICD-10-CM | POA: Insufficient documentation

## 2022-08-12 DIAGNOSIS — F1090 Alcohol use, unspecified, uncomplicated: Secondary | ICD-10-CM | POA: Diagnosis present

## 2022-08-12 DIAGNOSIS — F1020 Alcohol dependence, uncomplicated: Secondary | ICD-10-CM | POA: Insufficient documentation

## 2022-08-12 DIAGNOSIS — F1014 Alcohol abuse with alcohol-induced mood disorder: Secondary | ICD-10-CM

## 2022-08-12 DIAGNOSIS — E876 Hypokalemia: Secondary | ICD-10-CM

## 2022-08-12 DIAGNOSIS — Z7901 Long term (current) use of anticoagulants: Secondary | ICD-10-CM | POA: Insufficient documentation

## 2022-08-12 DIAGNOSIS — Y901 Blood alcohol level of 20-39 mg/100 ml: Secondary | ICD-10-CM | POA: Insufficient documentation

## 2022-08-12 DIAGNOSIS — F4310 Post-traumatic stress disorder, unspecified: Secondary | ICD-10-CM | POA: Insufficient documentation

## 2022-08-12 DIAGNOSIS — F1990 Other psychoactive substance use, unspecified, uncomplicated: Secondary | ICD-10-CM

## 2022-08-12 DIAGNOSIS — Z1152 Encounter for screening for COVID-19: Secondary | ICD-10-CM | POA: Insufficient documentation

## 2022-08-12 LAB — CBC WITH DIFFERENTIAL/PLATELET
Abs Immature Granulocytes: 0.04 10*3/uL (ref 0.00–0.07)
Basophils Absolute: 0 10*3/uL (ref 0.0–0.1)
Basophils Relative: 0 %
Eosinophils Absolute: 0 10*3/uL (ref 0.0–0.5)
Eosinophils Relative: 0 %
HCT: 39 % (ref 39.0–52.0)
Hemoglobin: 12.4 g/dL — ABNORMAL LOW (ref 13.0–17.0)
Immature Granulocytes: 0 %
Lymphocytes Relative: 16 %
Lymphs Abs: 1.7 10*3/uL (ref 0.7–4.0)
MCH: 29.1 pg (ref 26.0–34.0)
MCHC: 31.8 g/dL (ref 30.0–36.0)
MCV: 91.5 fL (ref 80.0–100.0)
Monocytes Absolute: 0.7 10*3/uL (ref 0.1–1.0)
Monocytes Relative: 6 %
Neutro Abs: 8.4 10*3/uL — ABNORMAL HIGH (ref 1.7–7.7)
Neutrophils Relative %: 78 %
Platelets: 346 10*3/uL (ref 150–400)
RBC: 4.26 MIL/uL (ref 4.22–5.81)
RDW: 15.6 % — ABNORMAL HIGH (ref 11.5–15.5)
WBC: 10.8 10*3/uL — ABNORMAL HIGH (ref 4.0–10.5)
nRBC: 0 % (ref 0.0–0.2)

## 2022-08-12 LAB — ETHANOL: Alcohol, Ethyl (B): 26 mg/dL — ABNORMAL HIGH (ref <10)

## 2022-08-12 LAB — COMPREHENSIVE METABOLIC PANEL
ALT: 27 U/L (ref 0–44)
AST: 28 U/L (ref 15–41)
Albumin: 4.1 g/dL (ref 3.5–5.0)
Alkaline Phosphatase: 70 U/L (ref 38–126)
Anion gap: 16 — ABNORMAL HIGH (ref 5–15)
BUN: 7 mg/dL (ref 6–20)
CO2: 20 mmol/L — ABNORMAL LOW (ref 22–32)
Calcium: 9.7 mg/dL (ref 8.9–10.3)
Chloride: 102 mmol/L (ref 98–111)
Creatinine, Ser: 0.89 mg/dL (ref 0.61–1.24)
GFR, Estimated: >60 mL/min (ref >60)
Glucose, Bld: 108 mg/dL — ABNORMAL HIGH (ref 70–99)
Potassium: 2.7 mmol/L — CL (ref 3.5–5.1)
Sodium: 138 mmol/L (ref 135–145)
Total Bilirubin: 0.3 mg/dL (ref 0.3–1.2)
Total Protein: 8.1 g/dL (ref 6.5–8.1)

## 2022-08-12 LAB — TROPONIN I (HIGH SENSITIVITY)
Troponin I (High Sensitivity): 5 ng/L (ref <18)
Troponin I (High Sensitivity): 4 ng/L (ref <18)

## 2022-08-12 LAB — RESP PANEL BY RT-PCR (FLU A&B, COVID) ARPGX2
Influenza A by PCR: NEGATIVE
Influenza B by PCR: NEGATIVE
SARS Coronavirus 2 by RT PCR: NEGATIVE

## 2022-08-12 LAB — MAGNESIUM: Magnesium: 1.4 mg/dL — ABNORMAL LOW (ref 1.7–2.4)

## 2022-08-12 MED ORDER — LORAZEPAM 1 MG PO TABS: 1.0000 mg | ORAL_TABLET | Freq: Two times a day (BID) | ORAL | Status: DC

## 2022-08-12 MED ORDER — ZIPRASIDONE MESYLATE 20 MG IM SOLR: 20.0000 mg | INTRAMUSCULAR | Status: DC | PRN

## 2022-08-12 MED ORDER — TRAZODONE HCL 50 MG PO TABS: 50.0000 mg | ORAL_TABLET | Freq: Every evening | ORAL | Status: DC | PRN

## 2022-08-12 MED ORDER — MAGNESIUM OXIDE -MG SUPPLEMENT 400 (240 MG) MG PO TABS
400.0000 mg | ORAL_TABLET | Freq: Two times a day (BID) | ORAL | Status: DC
  Administered 2022-08-12: 400 mg via ORAL
  Filled 2022-08-12: qty 1

## 2022-08-12 MED ORDER — ONDANSETRON 4 MG PO TBDP: 4.0000 mg | ORAL_TABLET | Freq: Four times a day (QID) | ORAL | Status: AC | PRN

## 2022-08-12 MED ORDER — MAGNESIUM HYDROXIDE 400 MG/5ML PO SUSP: 30.0000 mL | Freq: Every day | ORAL | Status: DC | PRN

## 2022-08-12 MED ORDER — ALUM & MAG HYDROXIDE-SIMETH 200-200-20 MG/5ML PO SUSP: 30.0000 mL | ORAL | Status: DC | PRN

## 2022-08-12 MED ORDER — MAGNESIUM SULFATE 2 GM/50ML IV SOLN
2.0000 g | Freq: Once | INTRAVENOUS | Status: AC
  Administered 2022-08-12: 2 g via INTRAVENOUS
  Filled 2022-08-12: qty 50

## 2022-08-12 MED ORDER — OLANZAPINE 5 MG PO TBDP: 5.0000 mg | ORAL_TABLET | Freq: Every day | ORAL | Status: DC

## 2022-08-12 MED ORDER — POTASSIUM CHLORIDE CRYS ER 20 MEQ PO TBCR: 20.0000 meq | EXTENDED_RELEASE_TABLET | Freq: Every day | ORAL | Status: DC

## 2022-08-12 MED ORDER — POTASSIUM CHLORIDE CRYS ER 20 MEQ PO TBCR
40.0000 meq | EXTENDED_RELEASE_TABLET | Freq: Once | ORAL | Status: AC
  Administered 2022-08-12: 40 meq via ORAL
  Filled 2022-08-12: qty 2

## 2022-08-12 MED ORDER — THIAMINE HCL 100 MG/ML IJ SOLN
100.0000 mg | Freq: Once | INTRAMUSCULAR | Status: AC
  Administered 2022-08-12: 100 mg via INTRAMUSCULAR
  Filled 2022-08-12: qty 2

## 2022-08-12 MED ORDER — LORAZEPAM 1 MG PO TABS: 1.0000 mg | ORAL_TABLET | Freq: Four times a day (QID) | ORAL | Status: AC | PRN

## 2022-08-12 MED ORDER — HYDROXYZINE HCL 25 MG PO TABS: 25.0000 mg | ORAL_TABLET | Freq: Three times a day (TID) | ORAL | Status: DC | PRN

## 2022-08-12 MED ORDER — ESCITALOPRAM OXALATE 10 MG PO TABS: 20.0000 mg | ORAL_TABLET | Freq: Every day | ORAL | Status: DC

## 2022-08-12 MED ORDER — LORAZEPAM 1 MG PO TABS: 1.0000 mg | ORAL_TABLET | Freq: Every day | ORAL | Status: DC

## 2022-08-12 MED ORDER — LORAZEPAM 1 MG PO TABS: 1.0000 mg | ORAL_TABLET | Freq: Three times a day (TID) | ORAL | Status: DC

## 2022-08-12 MED ORDER — RISPERIDONE 1 MG PO TBDP: 2.0000 mg | ORAL_TABLET | Freq: Three times a day (TID) | ORAL | Status: DC | PRN

## 2022-08-12 MED ORDER — RISPERIDONE 1 MG PO TABS
1.0000 mg | ORAL_TABLET | Freq: Once | ORAL | Status: AC
  Administered 2022-08-12: 1 mg via ORAL
  Filled 2022-08-12: qty 1

## 2022-08-12 MED ORDER — ADULT MULTIVITAMIN W/MINERALS CH
1.0000 | ORAL_TABLET | Freq: Every day | ORAL | Status: DC
  Administered 2022-08-12: 1 via ORAL
  Filled 2022-08-12: qty 1

## 2022-08-12 MED ORDER — LOPERAMIDE HCL 2 MG PO CAPS: 2.0000 mg | ORAL_CAPSULE | ORAL | Status: AC | PRN

## 2022-08-12 MED ORDER — LORAZEPAM 1 MG PO TABS
1.0000 mg | ORAL_TABLET | Freq: Four times a day (QID) | ORAL | Status: DC
  Administered 2022-08-12: 1 mg via ORAL
  Filled 2022-08-12 (×2): qty 1

## 2022-08-12 MED ORDER — ACETAMINOPHEN 325 MG PO TABS: 650.0000 mg | ORAL_TABLET | Freq: Four times a day (QID) | ORAL | Status: DC | PRN

## 2022-08-12 MED ORDER — LORAZEPAM 1 MG PO TABS: 1.0000 mg | ORAL_TABLET | ORAL | Status: DC | PRN

## 2022-08-12 MED ORDER — TRAZODONE HCL 50 MG PO TABS
50.0000 mg | ORAL_TABLET | Freq: Every evening | ORAL | Status: DC | PRN
  Administered 2022-08-13 – 2022-08-20 (×5): 50 mg via ORAL
  Filled 2022-08-12: qty 14
  Filled 2022-08-12 (×5): qty 1

## 2022-08-12 MED ORDER — THIAMINE MONONITRATE 100 MG PO TABS: 100.0000 mg | ORAL_TABLET | Freq: Every day | ORAL | Status: DC

## 2022-08-12 MED ORDER — HYDROXYZINE HCL 25 MG PO TABS
25.0000 mg | ORAL_TABLET | Freq: Three times a day (TID) | ORAL | Status: DC | PRN
  Administered 2022-08-20: 25 mg via ORAL
  Filled 2022-08-12: qty 15
  Filled 2022-08-12: qty 1

## 2022-08-12 NOTE — ED Notes (Signed)
Pt moved to hall24, pt asking for security and saying that he feels more comfortable when they are around

## 2022-08-12 NOTE — ED Notes (Signed)
Patient belongings and paperwork sent with safe transport the patient had both items in the front seat of the car upon discharge. Patient ambulates to the lobby with a steady gait

## 2022-08-12 NOTE — ED Triage Notes (Signed)
Pt arrived by EMS from home complaining of chest pain and a panic attack  Pt is very paranoid and looking around the room

## 2022-08-12 NOTE — Consult Note (Signed)
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 male patient, presented to [facility], voluntarily, due to chest pain, anxiety, and suicidal ideations without a plan. Patient discharged from [facility] 08/03/2022 following a 6 day psychiatric inpatient admission for substance induced mood disorder.  HPI:  Joseph [name], 26 year old male, evaluated face to face at [facility], for psychiatric evaluation due to severe anxiety, depression, and suicidal ideations without a plan. 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 Last substance use per patient, 2 days ago and reports use of methamphetamines. He endorses during his recent admission at [facility], symptoms of anxiety and depression seemed to improve, but feels he was discharged too quickly. He endorses that he felt ready to go home from [facility] due to the Thanksgiving holiday, although his depression and anxiety returned almost immediately. Patient states, "I'm not sleeping well due to anxiety and chest pain". Endorses sleeping well while at [facility] with medications he received there. He denies filling prescriptions after being discharged from [facility] Patient endorses homelessness. Moved here from [place] to live with uncle, who he resides with from time to time. Endorses daily alcohol use 6-7 drinks of liquor per day. He last drank 48 hours ago. Used meth last 48 hours ago. He endorses suicidal ideations without a specific plan. Denies homicidal ideations.    During evaluation Joseph [name] is laying in bed with the head of bed elevated,  in no acute distress. He is alert, oriented x 4, anxious, but cooperative, with periods of inattentiveness during exam evidenced by the need for this writer to repeat questions prior to patient providing a response. His mood is dysphoric and anxious with congruent affect. He has normal speech, and with appropriate behavior.  Objectively there is no evidence of psychosis/mania or delusional thinking.  Patient is able to converse coherently, goal directed thoughts, or pre-occupation. He endorses active suicidal ideations without a specific a plan. He denies homicidal ideations, auditory or visual hallucinations. Patient answered question appropriately.  Patient is unable to contract for safety, despite denying a plan for suicide. Patient endorses needing help with addiction and with current mental state. UDS not available at present. Once medically cleared, will recommend transfer to [facility] if availability.   Past Psychiatric History:  [facility] admission for psychoactive substance induced disorder 08/03/2022 discharged Hx of psychiatric admission per prior psychiatry notes in [place] for psychosis Hx reaction of stiff neck "Haldol"  Long hx substance use amphetamines and alcohol Suicide attempt 2 years ago, per patient overdosed on "pills".   Risk to Self or Others: Is the patient at risk to self? Yes Has the patient been a risk to self in the past 6 months? Yes  [place] Scale:  [place] ED from 08/12/2022 in [place] Admission (Discharged) from 07/28/2022 in [place] [facility] ED from 07/27/2022 in [place] DEPT  C-SSRS RISK CATEGORY High Risk No Risk No Risk       Past Medical  History: History reviewed. No pertinent past medical history.  Past Surgical History:  Procedure Laterality Date    CYST REMOVAL HAND  06/22/2022   Procedure: Repair left hand laceration;  Surgeon: Broadus John, MD;  Location: [place];  Service: Vascular;;   HEMATOMA EVACUATION Left 06/22/2022   Procedure: HEMOTHORAX EVACUATION;  Surgeon: Broadus John, MD;  Location: [place];  Service: Vascular;  Laterality: Left;   LAPAROTOMY N/A 06/22/2022   Procedure: EXPLORATORY LAPAROTOMY PLACEMENT OF LEFT CHEST TUBE. REPAIR OF 7 CENTIMETER RIGHT SCALP LACERATION. REPAIR OF SCALP LACERATION  AT [place].;  Surgeon: Greer Pickerel, MD;  Location: [place];  Service: General;  Laterality: N/A;   WOUND EXPLORATION Left 06/22/2022   Procedure: Left chest exploration and intercostal artery ligation;  Surgeon: Broadus John, MD;  Location: [name] OR;  Service: Vascular;  Laterality: Left;    Social History:  Social History   Substance and Sexual Activity  Alcohol Use Yes   Alcohol/week: 8.0 standard drinks of alcohol   Types: 8 Cans of beer per week     Social History   Substance and Sexual Activity  Drug Use Yes   Types: Benzodiazepines, Marijuana    Social History   Socioeconomic History   Marital status: Unknown    Spouse name: Not on file   Number of children: Not on file   Years of education: Not on file   Highest education level: Not on file  Occupational History   Not on file  Tobacco Use   Smoking status: Every Day    Packs/day: 0.50    Types: Cigarettes   Smokeless tobacco: Current  Vaping Use   Vaping Use: Never used  Substance and Sexual Activity   Alcohol use: Yes    Alcohol/week: 8.0 standard drinks of alcohol    Types: 8 Cans of beer per week   Drug use: Yes    Types: Benzodiazepines, Marijuana   Sexual activity: Yes  Other Topics Concern   Not on file  Social History Narrative   ** Merged History Encounter **       Social Determinants of Health   Financial Resource Strain: Not on file  Food Insecurity: No Food Insecurity (07/28/2022)   Hunger Vital Sign    Worried About Running  Out of Food in the Last Year: Never true    Ran Out of Food in the Last Year: Never true  Transportation Needs: No Transportation Needs (07/28/2022)   PRAPARE - Hydrologist (Medical): No    Lack of Transportation (Non-Medical): No  Physical Activity: Not on file  Stress: Not on file  Social Connections: Not on file      Allergies:  No Known Allergies  Labs:  Results for orders placed or performed during the hospital encounter of 08/12/22 (from the past 48 hour(s))  CBC with Differential     Status: Abnormal   Collection Time: 08/12/22  3:19 AM  Result Value Ref Range   WBC 10.8 (H) 4.0 - 10.5 K/uL   RBC 4.26 4.22 - 5.81 MIL/uL   Hemoglobin 12.4 (L) 13.0 - 17.0 g/dL   HCT 39.0 39.0 - 52.0 %   MCV 91.5 80.0 - 100.0 fL   MCH 29.1 26.0 - 34.0 pg   MCHC 31.8 30.0 - 36.0 g/dL   RDW 15.6 (H) 11.5 - 15.5 %   Platelets 346 150 - 400 K/uL   nRBC 0.0 0.0 - 0.2 %   Neutrophils Relative %  78 %   Neutro Abs 8.4 (H) 1.7 - 7.7 K/uL   Lymphocytes Relative 16 %   Lymphs Abs 1.7 0.7 - 4.0 K/uL   Monocytes Relative 6 %   Monocytes Absolute 0.7 0.1 - 1.0 K/uL   Eosinophils Relative 0 %   Eosinophils Absolute 0.0 0.0 - 0.5 K/uL   Basophils Relative 0 %   Basophils Absolute 0.0 0.0 - 0.1 K/uL   Immature Granulocytes 0 %   Abs Immature Granulocytes 0.04 0.00 - 0.07 K/uL    Comment: Performed at [facility] [address], [place], [place] [address]  Troponin I (High Sensitivity)     Status: None   Collection Time: 08/12/22  3:19 AM  Result Value Ref Range   Troponin I (High Sensitivity) 5 <18 ng/L    Comment: (NOTE) Elevated high sensitivity troponin I (hsTnI) values and significant  changes across serial measurements may suggest ACS but many other  chronic and acute conditions are known to elevate hsTnI results.  Refer to the "Links" section for chest pain algorithms and additional  guidance. Performed at [facility], [place] [address],  [place], [place] [address]   Comprehensive metabolic panel     Status: Abnormal   Collection Time: 08/12/22  3:19 AM  Result Value Ref Range   Sodium 138 135 - 145 mmol/L   Potassium 2.7 (LL) 3.5 - 5.1 mmol/L    Comment: CRITICAL RESULT CALLED TO, READ BACK BY AND VERIFIED WITH JACKIE DODD RN 08/12/22 0410 Jerilynn Mages KOROLESKI   Chloride 102 98 - 111 mmol/L   CO2 20 (L) 22 - 32 mmol/L   Glucose, Bld 108 (H) 70 - 99 mg/dL    Comment: Glucose reference range applies only to samples taken after fasting for at least 8 hours.   BUN 7 6 - 20 mg/dL   Creatinine, Ser 0.89 0.61 - 1.24 mg/dL   Calcium 9.7 8.9 - 10.3 mg/dL   Total Protein 8.1 6.5 - 8.1 g/dL   Albumin 4.1 3.5 - 5.0 g/dL   AST 28 15 - 41 U/L   ALT 27 0 - 44 U/L   Alkaline Phosphatase 70 38 - 126 U/L   Total Bilirubin 0.3 0.3 - 1.2 mg/dL   GFR, Estimated >60 >60 mL/min    Comment: (NOTE) Calculated using the CKD-EPI Creatinine Equation (2021)    Anion gap 16 (H) 5 - 15    Comment: Performed at [facility] [address] [place], [place] [address]  Ethanol     Status: Abnormal   Collection Time: 08/12/22  3:19 AM  Result Value Ref Range   Alcohol, Ethyl (B) 26 (H) <10 mg/dL    Comment: (NOTE) Lowest detectable limit for serum alcohol is 10 mg/dL.  For medical purposes only. Performed at [facility], [place] [address] [place], [place] [address]   Magnesium     Status: Abnormal   Collection Time: 08/12/22  3:19 AM  Result Value Ref Range   Magnesium 1.4 (L) 1.7 - 2.4 mg/dL    Comment: Performed at [facility] [address] [place], [place] [address]  Troponin I (High Sensitivity)     Status: None   Collection Time: 08/12/22  5:06 AM  Result Value Ref Range   Troponin I (High Sensitivity) 4 <18 ng/L    Comment: (NOTE) Elevated high sensitivity troponin I (hsTnI) values and significant  changes across serial measurements may suggest ACS but many other  chronic and acute conditions are known to elevate hsTnI  results.  Refer to the "Links" section for chest pain algorithms and additional  guidance. Performed at [facility], [address], [place], [place] [address]     Current Facility-Administered Medications  Medication Dose Route Frequency Provider Last Rate Last Admin   escitalopram (LEXAPRO) tablet 20 mg  20 mg Oral QHS Bing Neighbors, FNP       hydrOXYzine (ATARAX) tablet 25 mg  25 mg Oral TID PRN Bing Neighbors, FNP       LORazepam (ATIVAN) tablet 1 mg  1 mg Oral PRN Derwood Kaplan, MD       And   ziprasidone (GEODON) injection 20 mg  20 mg Intramuscular PRN Derwood Kaplan, MD       magnesium oxide (MAG-OX) tablet 400 mg  400 mg Oral BID Rhunette Croft, Ankit, MD   400 mg at 08/12/22 1018   OLANZapine zydis (ZYPREXA) disintegrating tablet 5 mg  5 mg Oral QHS Bing Neighbors, FNP       [START ON 08/13/2022] potassium chloride SA (KLOR-CON M) CR tablet 20 mEq  20 mEq Oral Daily Nanavati, Ankit, MD       potassium chloride SA (KLOR-CON M) CR tablet 40 mEq  40 mEq Oral Once Derwood Kaplan, MD       traZODone (DESYREL) tablet 50 mg  50 mg Oral QHS PRN Bing Neighbors, FNP       Current Outpatient Medications  Medication Sig Dispense Refill   acetaminophen (TYLENOL) 500 MG tablet Take 2 tablets (1,000 mg total) by mouth every 6 (six) hours as needed. (Patient taking differently: Take 1,000 mg by mouth every 6 (six) hours as needed for mild pain.) 30 tablet 0   escitalopram (LEXAPRO) 20 MG tablet Take 1 tablet (20 mg total) by mouth daily. 30 tablet 0   folic acid (FOLVITE) 1 MG tablet Take 1 tablet (1 mg total) by mouth daily. (Patient not taking: Reported on 07/27/2022)     hydrOXYzine (ATARAX) 25 MG tablet Take 1 tablet (25 mg total) by mouth 3 (three) times daily as needed for anxiety. 30 tablet 0   Multiple Vitamin (MULTIVITAMIN WITH MINERALS) TABS tablet Take 1 tablet by mouth daily. (Patient not taking: Reported on 07/27/2022)     mupirocin ointment (BACTROBAN) 2 %  Apply topically 2 (two) times daily. 22 g 0   nicotine (NICODERM CQ - DOSED IN MG/24 HOURS) 21 mg/24hr patch Place 1 patch (21 mg total) onto the skin daily. 28 patch 0   OLANZapine (ZYPREXA) 5 MG tablet Take 1 tablet (5 mg total) by mouth at bedtime. 30 tablet 0   Rivaroxaban (XARELTO) 15 MG TABS tablet Take 1 tablet (15 mg total) by mouth 2 (two) times daily for 3 days. 6 tablet 0   rivaroxaban (XARELTO) 20 MG TABS tablet Take 1 tablet (20 mg total) by mouth daily. 30 tablet 0   thiamine (VITAMIN B-1) 100 MG tablet Take 1 tablet (100 mg total) by mouth daily. (Patient not taking: Reported on 07/27/2022)       Psychiatric Specialty Exam: Presentation  General Appearance:  Appropriate for Environment  Eye Contact: Good  Speech: Clear and Coherent  Speech Volume: Normal  Handedness: Right   Mood and Affect  Mood: Anxious; Dysphoric  Affect: Congruent; Depressed   Thought Process  Thought Processes: Coherent; Linear  Descriptions of Associations:Intact  Orientation:Full (Time, Place and Person)  Thought Content:Logical  History of Schizophrenia/Schizoaffective  disorder:-- (No documented formal history of schizophrenia recent dx psychosis)  Duration of Psychotic Symptoms:Less than six months  Hallucinations:Hallucinations: None  Ideas of Reference:None  Suicidal Thoughts:Suicidal Thoughts: Yes, Passive SI Passive Intent and/or Plan: Without Plan  Homicidal Thoughts:Homicidal Thoughts: No   Sensorium  Memory: Immediate Good; Recent Good  Judgment: Intact  Insight: Fair   Community education officer  Concentration: Poor  Attention Span: Poor  Recall: Good  Fund of Knowledge: Good  Language: Good   Psychomotor Activity  Psychomotor Activity: Psychomotor Activity: Normal   Assets  Assets: Communication Skills; Desire for Improvement; Physical Health    Sleep  Sleep: Sleep: Poor   Physical Exam: Physical Exam Vitals reviewed.   Constitutional:      Appearance: He is well-developed.  HENT:     Head: Normocephalic and atraumatic.  Eyes:     Extraocular Movements: Extraocular movements intact.     Pupils: Pupils are equal, round, and reactive to light.  Cardiovascular:     Rate and Rhythm: Normal rate and regular rhythm.  Pulmonary:     Effort: Pulmonary effort is normal.  Musculoskeletal:        General: Normal range of motion.     Cervical back: Normal range of motion.  Neurological:     General: No focal deficit present.     Mental Status: He is alert.    Review of Systems  Psychiatric/Behavioral:  Positive for depression, substance abuse and suicidal ideas. The patient is nervous/anxious and has insomnia.    Blood pressure (!) 141/72, pulse 97, temperature 98.7 F (37.1 C), resp. rate 20, height 5\' 11"  (1.803 m), weight 83 kg, SpO2 100 %. Body mass index is 25.52 kg/m.  Medical Decision Making: Patient case review and discussed with Dr. Dwyane Dee, patient meets inpatient criteria for inpatient psychiatric treatment as patient is unable to contract for safety at this time.  Secure message sent to [facility] requesting patient to be reviewed for acceptance. If no bed availability, CSW will be faxing patient out. EDP, RN, LCSW, notified of disposition.    Problem 1: Psychoactive substance-induced mood disorder (HCC), restart Zyprexa 5 mg QHS (ECG reviewed, QT interval <500), Trazodone 50 mg QHS PRN for insomnia, restarted Lexapro 20 mg depressed mood/anxiety, and Hydroxyzine 25 mg TID PRN.   Disposition: Recommend Facility Based Crisis at [facility] once medically cleared.    Molli Barrows, FNP-C, PMHNP-BC 08/12/2022 12:38 PM

## 2022-08-12 NOTE — ED Notes (Signed)
This patient is not IVC for now 

## 2022-08-12 NOTE — ED Notes (Signed)
Safe transport called at this time. 
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
## 2022-08-12 NOTE — ED Notes (Signed)
Patient states he has suicidal ideation without a plan, provider notified. Patient states he did meth 2 days ago. Patient denied IV at this time and said to give him some time.

## 2022-08-12 NOTE — ED Notes (Signed)
Patient resting with no sxs of distress - will continue to monitor for safety 

## 2022-08-12 NOTE — ED Triage Notes (Signed)
Pt states that he has not had any of his medications since being discharged from the [facility] on [facility]/14

## 2022-08-12 NOTE — ED Notes (Signed)
Patient seclusive to room- denies SI, HI AVH - does not want ordered med at this time - "maybe later"

## 2022-08-12 NOTE — ED Provider Notes (Signed)
[facility] EMERGENCY DEPARTMENT Provider Note   CSN: [id number] Arrival date & time: 08/12/22  0259     History {Add pertinent medical, surgical, social history, OB history to HPI:1} Chief Complaint  Patient presents with   Anxiety   Chest Pain    Joseph [name] is a 26 y.o. male.  HPI     26 year old male comes in with chief complaint of anxiety and chest pain. 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  Patient states that he last used meth 2 days ago.  He is hearing voices and having suicidal ideation.  He is not taking his medications as prescribed.  He was recently admitted to the hospital for psychiatric reasons, and suspects that he might need admission again given his decompensation.  With the suicidal ideations he denies any attempt.  Home Medications Prior to Admission medications   Medication Sig Start Date End Date Taking? Authorizing Provider  acetaminophen (TYLENOL) 500 MG tablet Take 2 tablets (1,000 mg total) by mouth every 6 (six) hours as needed. Patient taking differently: Take 1,000 mg by mouth every 6 (six) hours as needed for mild pain. 07/10/22   Adam Phenix, PA-C  escitalopram (LEXAPRO) 20 MG tablet Take 1 tablet (20 mg total) by mouth daily. 08/03/22   Massengill, Harrold Donath, MD  folic acid (FOLVITE) 1 MG tablet Take 1 tablet (1 mg total) by mouth daily. Patient not taking: Reported on 07/27/2022 07/11/22   Adam Phenix, PA-C  hydrOXYzine (ATARAX) 25 MG tablet Take 1 tablet (25 mg total) by mouth 3 (three) times daily as needed for anxiety. 08/03/22   Massengill, Harrold Donath, MD  Multiple Vitamin (MULTIVITAMIN WITH MINERALS) TABS tablet Take 1 tablet by mouth daily. Patient not taking: Reported on 07/27/2022 07/11/22   Adam Phenix, PA-C  mupirocin ointment  (BACTROBAN) 2 % Apply topically 2 (two) times daily. 07/16/22   Adam Phenix, PA-C  nicotine (NICODERM CQ - DOSED IN MG/24 HOURS) 21 mg/24hr patch Place 1 patch (21 mg total) onto the skin daily. 08/03/22   Massengill, Harrold Donath, MD  OLANZapine (ZYPREXA) 5 MG tablet Take 1 tablet (5 mg total) by mouth at bedtime. 08/03/22   Massengill, Harrold Donath, MD  Rivaroxaban (XARELTO) 15 MG TABS tablet Take 1 tablet (15 mg total) by mouth 2 (two) times daily for 3 days. 08/03/22 08/06/22  Massengill, Harrold Donath, MD  rivaroxaban (XARELTO) 20 MG TABS tablet Take 1 tablet (20 mg total) by mouth daily. 08/06/22   Massengill, Harrold Donath, MD  thiamine (VITAMIN B-1) 100 MG tablet Take 1 tablet (100 mg total) by mouth daily. Patient not taking: Reported on 07/27/2022 07/16/22   Adam Phenix, PA-C      Allergies    Patient has no known allergies.    Review of Systems   Review of Systems  Physical Exam Updated Vital Signs BP (!) 141/72 (BP Location: Right Arm)   Pulse 97   Temp 98.7 F (37.1 C)   Resp 20   Ht 5\' 11"  (1.803 m)   Wt 83 kg   SpO2 100%   BMI 25.52 kg/m  Physical Exam Vitals and nursing note reviewed.  Constitutional:      Appearance: He is well-developed.  HENT:     Head: Atraumatic.  Cardiovascular:     Rate and Rhythm: Normal rate.  Pulmonary:     Effort:  Pulmonary effort is normal.  Musculoskeletal:     Cervical back: Neck supple.  Skin:    General: Skin is warm.  Neurological:     Mental Status: He is alert and oriented to person, place, and time.  Psychiatric:        Mood and Affect: Mood is anxious.     Comments: Slightly anxious and paranoid appearing     ED Results / Procedures / Treatments   Labs (all labs ordered are listed, but only abnormal results are displayed) Labs Reviewed  CBC WITH DIFFERENTIAL/PLATELET - Abnormal; Notable for the following components:      Result Value   WBC 10.8 (*)    Hemoglobin 12.4 (*)    RDW 15.6 (*)    Neutro Abs 8.4 (*)    All  other components within normal limits  COMPREHENSIVE METABOLIC PANEL - Abnormal; Notable for the following components:   Potassium 2.7 (*)    CO2 20 (*)    Glucose, Bld 108 (*)    Anion gap 16 (*)    All other components within normal limits  ETHANOL - Abnormal; Notable for the following components:   Alcohol, Ethyl (B) 26 (*)    All other components within normal limits  MAGNESIUM - Abnormal; Notable for the following components:   Magnesium 1.4 (*)    All other components within normal limits  RAPID URINE DRUG SCREEN, HOSP PERFORMED  TROPONIN I (HIGH SENSITIVITY)  TROPONIN I (HIGH SENSITIVITY)    EKG EKG Interpretation  Date/Time:  Wednesday August 12 2022 02:58:30 EST Ventricular Rate:  82 PR Interval:  122 QRS Duration: 90 QT Interval:  350 QTC Calculation: 408 R Axis:   91 Text Interpretation: Normal sinus rhythm Rightward axis Nonspecific T wave abnormality Abnormal ECG When compared with ECG of 27-Jul-2022 06:22, PREVIOUS ECG IS PRESENT U waves present Confirmed by Derwood Kaplan [phone]) on 08/12/2022 8:07:09 AM  Radiology DG Chest 2 View  Result Date: 08/12/2022 CLINICAL DATA:  Chest pain EXAM: CHEST - 2 VIEW COMPARISON:  07/27/2022 FINDINGS: The heart size and mediastinal contours are within normal limits. Both lungs are clear. The visualized skeletal structures are unremarkable. IMPRESSION: No active cardiopulmonary disease. Electronically Signed   By: Alcide Clever M.D.   On: 08/12/2022 03:39    Procedures Procedures  {Document cardiac monitor, telemetry assessment procedure when appropriate:1}  Medications Ordered in ED Medications  magnesium sulfate IVPB 2 g 50 mL (has no administration in time range)  risperiDONE (RISPERDAL) tablet 1 mg (has no administration in time range)  risperiDONE (RISPERDAL M-TABS) disintegrating tablet 2 mg (has no administration in time range)    And  LORazepam (ATIVAN) tablet 1 mg (has no administration in time range)    And   ziprasidone (GEODON) injection 20 mg (has no administration in time range)  potassium chloride SA (KLOR-CON M) CR tablet 40 mEq (40 mEq Oral Given 08/12/22 0809)    ED Course/ Medical Decision Making/ A&P                           Medical Decision Making Risk Prescription drug management.  26 year old male comes in with chief complaint of suicidal ideation, chest pain and anxiety.  He has history of substance use disorder, psychosis.   I have reviewed patient's chart.  He was admitted to behavioral health hospital earlier this month for his psychosis.  Patient indicates that he has not taken his medications and has been  using methamphetamines.  With his suicidal ideation he has not attempted to hurt himself.  Patient appears anxious, slightly paranoid.  Oral Risperdal ordered.  Labs indicate hypokalemia, hypomagnesemia. Both of these electrolytes will need to be supplemented.  EKG does not show any QT prolongation.  We will order IV magnesium, oral K, but patient is medically cleared for psychiatric evaluation. I will put additional potassium and magnesium supplementation as well that patient can get while he is in the ER -psychiatry team can continue supplementation as needed.  Final Clinical Impression(s) / ED Diagnoses Final diagnoses:  Hypokalemia  Hypomagnesemia  Substance use disorder  Suicidal ideation    Rx / DC Orders ED Discharge Orders     None

## 2022-08-12 NOTE — ED Notes (Signed)
Patient admitted to [facility] from [facility] ED for detox from meth, ETOH, cocaine and marijuana.  Patient started on ativan taper.  CIWA at admission = 0.  Patient was cooperative with admission process however appeared paranoid, glancing round cautiously.  Patient accepted reassurance that he is safe on unit.  He admitted that he "had a situation out in the street."  But would not elaborate.  Patient oriented to unit and shown to his room.  He was then given a meal and something to drink.  He is now on the phone.  Will monitor and provide safe environment.

## 2022-08-12 NOTE — ED Notes (Signed)
Patient belonging put in locker #5 in purple zone.

## 2022-08-12 NOTE — Progress Notes (Signed)
Pt accepted to [facility] per Dr. Cyndie Chime, DO PENDING Negative COVID, and 2nd dose of K. Pt meets criteria per Joaquin Courts, FNP. Phone number for report is [phone].  Care Team notified: Joaquin Courts, FNP, Doran Heater, FNP, Lauree Chandler, NP, Olena Leatherwood, RN, Lorrene Reid, RN, Derwood Kaplan, MD  Maryjean Ka, MSW, [facility] 08/12/2022 1:31 PM

## 2022-08-12 NOTE — ED Provider Notes (Signed)
Facility Based Crisis Admission H&P  Date: 08/12/22 Patient Name: Joseph [name] MRN: [id number] Chief Complaint: No chief complaint on file.     Diagnoses:  Final diagnoses:  Alcohol abuse with alcohol-induced mood disorder (HCC)  Severe episode of recurrent major depressive disorder, with psychotic features [facility])    HPI: Patient presents voluntarily to [facility] behavioral health, transported from [facility] emergency department.  Patient is assessed, face-to-face, by nurse practitioner.  He is seated in assessment area, no apparent distress.  He is alert and oriented, pleasant and cooperative during assessment.  Patient states "I have a lot of stress, I am homeless, I just had a child and I got into a bad situation with a friend of mine."  Patient presents with depressed mood, euthymic affect.  Jill Alexanders endorses passive suicidal ideation, ongoing times approximately 1 month.  He denies plan or intent to harm self.  He endorses 1 previous suicide attempt, 2 years ago, when he ingested an intentional overdose.  He denies homicidal ideation.  He easily contracts verbally for safety with this Clinical research associate.  Recent stressors include alcohol and substance use.  Patient reports he typically uses alcohol daily, an average of 6, sixteen ounce beers per day.  He denies history of withdrawal seizures denies history of delirium tremens.  Last alcohol use on yesterday.  He also uses marijuana daily, most recent marijuana use yesterday.  He also uses cocaine, most days or "when I can", most recent cocaine use 5 days ago.  Patient endorses frequent methamphetamine use, most recent methamphetamine use 2 days ago.  Patient is insightful today and committed to his sobriety.  He would like to transition to residential substance use treatment.  Shoua currently meets with substance use treatment group therapy 2 times per week at [facility].  He is required to attend weekly meetings x 3  according to his probation agreement.  He is also linked with an individual counselor, Christen Bame, at [facility] [place].  Patient meets with [facility] for individual therapy once each week.  He endorses personal history of PTSD.  Per chart review patient also diagnosed with psychoactive substance induced psychosis, alcohol use disorder, major depressive disorder, stimulant abuse, suicidal ideation and alcohol abuse with alcohol induced mood disorder.  He denies current medications.  He endorses history of multiple previous inpatient psychiatric hospitalizations.  No family mental health history reported.  Patient denies auditory and visual hallucinations currently.  He reports "sometimes I hear voices, I thought that I was hearing things earlier today."  He states "sometimes I think I hear voices when I am using (substances) other times I am not sure."  He reports he has recently become involved in a situation with a friend that could potentially  place him in danger.  Patient potentially paranoid surrounding situate patient with a friend, he does elaborate on "situation."   There is no evidence of delusional thought content and no indication patient is responding to internal stimuli.  Shaft is currently homeless in [place].  He denies access to weapons.  He is not currently employed.  He endorses average sleep and appetite.  Patient offered support and encouragement.  Patient remains voluntary.  He agrees with plan for admission to facility based crisis unit for treatment and stabilization.  Reviewed medications including lorazepam, trazodone and hydroxyzine, reviewed side effects and offered patient opportunity to ask questions.  PHQ 2-9:   Flowsheet Row ED from 08/12/2022 in [facility] Most recent  reading at 08/12/2022  5:37 PM [facility] Most recent reading at 08/12/2022 10:00 AM Admission  (Discharged) [facility] Most recent reading at 07/28/2022  6:00 PM  C-SSRS RISK CATEGORY No Risk High Risk No Risk        Total Time spent with patient: 30 minutes  Musculoskeletal  Strength & Muscle Tone: within normal limits Gait & Station: normal Patient leans: N/A  Psychiatric Specialty Exam  Presentation General Appearance:  Appropriate for Environment; Casual  Eye Contact: Good  Speech: Clear and Coherent; Normal Rate  Speech Volume: Normal  Handedness: Right   Mood and Affect  Mood: Depressed  Affect: Depressed   Thought Process  Thought Processes: Coherent; Goal Directed; Linear  Descriptions of Associations:Intact  Orientation:Full (Time, Place and Person)  Thought Content:Logical; WDL  Diagnosis of Schizophrenia or Schizoaffective disorder in past: No  Duration of Psychotic Symptoms: Less than six months  Hallucinations:Hallucinations: None  Ideas of Reference:None  Suicidal Thoughts:Suicidal Thoughts: Yes, Passive SI Passive Intent and/or Plan: Without Intent; Without Plan  Homicidal Thoughts:Homicidal Thoughts: No   Sensorium  Memory: Immediate Good; Recent Good  Judgment: Fair  Insight: Fair   Executive Functions  Concentration: Good  Attention Span: Good  Recall: Good  Fund of Knowledge: Good  Language: Good   Psychomotor Activity  Psychomotor Activity: Psychomotor Activity: Normal   Assets  Assets: Communication Skills; Desire for Improvement; Physical Health; Social Support; Resilience; Leisure Time   Sleep  Sleep: Sleep: Fair   Nutritional Assessment (For OBS and FBC admissions only) Has the patient had a weight loss or gain of 10 pounds or more in the last 3 months?: No Has the patient had a decrease in food intake/or appetite?: No Does the patient have dental problems?: No Does the patient have eating habits or behaviors that may be  indicators of an eating disorder including binging or inducing vomiting?: No Has the patient recently lost weight without trying?: 0 Has the patient been eating poorly because of a decreased appetite?: 0 Malnutrition Screening Tool Score: 0    Physical Exam Vitals and nursing note reviewed.  Constitutional:      Appearance: Normal appearance. He is well-developed and normal weight.  HENT:     Head: Normocephalic and atraumatic.     Nose: Nose normal.  Cardiovascular:     Rate and Rhythm: Normal rate.  Pulmonary:     Effort: Pulmonary effort is normal.  Musculoskeletal:        General: Normal range of motion.     Cervical back: Normal range of motion.  Skin:    General: Skin is warm and dry.  Neurological:     Mental Status: He is alert and oriented to person, place, and time.  Psychiatric:        Attention and Perception: Attention and perception normal.        Mood and Affect: Affect normal. Mood is depressed.        Speech: Speech normal.        Behavior: Behavior normal. Behavior is cooperative.        Thought Content: Thought content includes suicidal ideation.        Cognition and Memory: Cognition normal.    Review of Systems  Constitutional: Negative.   HENT: Negative.    Eyes: Negative.   Respiratory: Negative.    Cardiovascular: Negative.   Gastrointestinal: Negative.   Genitourinary: Negative.  Musculoskeletal: Negative.   Skin: Negative.   Neurological: Negative.   Psychiatric/Behavioral:  Positive for depression, substance abuse and suicidal ideas.     Blood pressure 124/75, pulse (!) 104, temperature 98.3 F (36.8 C), resp. rate 18, SpO2 100 %. There is no height or weight on file to calculate BMI.  Past Psychiatric History: See above  Is the patient at risk to self? No  Has the patient been a risk to self in the past 6 months? No .    Has the patient been a risk to self within the distant past? Yes   Is the patient a risk to others? No   Has the  patient been a risk to others in the past 6 months? No   Has the patient been a risk to others within the distant past? No   Past Medical History: No past medical history on file.  Past Surgical History:  Procedure Laterality Date   CYST REMOVAL HAND  06/22/2022   Procedure: Repair left hand laceration;  Surgeon: Victorino Sparrow, MD;  Location: [facility];  Service: Vascular;;   HEMATOMA EVACUATION Left 06/22/2022   Procedure: HEMOTHORAX EVACUATION;  Surgeon: Victorino Sparrow, MD;  Location[facility] OR;  Service: Vascular;  Laterality: Left;   LAPAROTOMY N/A 06/22/2022   Procedure: EXPLORATORY LAPAROTOMY PLACEMENT OF LEFT CHEST TUBE. REPAIR OF 7 CENTIMETER RIGHT SCALP LACERATION. REPAIR OF SCALP LACERATION  AT CROWN.;  Surgeon: Gaynelle Adu, MD;  Location: [facility] OR;  Service: General;  Laterality: N/A;   WOUND EXPLORATION Left 06/22/2022   Procedure: Left chest exploration and intercostal artery ligation;  Surgeon: Victorino Sparrow, MD;  Location: [facility] OR;  Service: Vascular;  Laterality: Left;    Family History: No family history on file.  Social History:  Social History   Socioeconomic History   Marital status: Unknown    Spouse name: Not on file   Number of children: Not on file   Years of education: Not on file   Highest education level: Not on file  Occupational History   Not on file  Tobacco Use   Smoking status: Every Day    Packs/day: 0.50    Types: Cigarettes   Smokeless tobacco: Current  Vaping Use   Vaping Use: Never used  Substance and Sexual Activity   Alcohol use: Yes    Alcohol/week: 8.0 standard drinks of alcohol    Types: 8 Cans of beer per week   Drug use: Yes    Types: Benzodiazepines, Marijuana   Sexual activity: Yes  Other Topics Concern   Not on file  Social History Narrative   ** Merged History Encounter **       Social Determinants of Health   Financial Resource Strain: Not on file  Food Insecurity: No Food Insecurity (07/28/2022)   Hunger Vital Sign     Worried About Running Out of Food in the Last Year: Never true    Ran Out of Food in the Last Year: Never true  Transportation Needs: No Transportation Needs (07/28/2022)   PRAPARE - Administrator, Civil Service (Medical): No    Lack of Transportation (Non-Medical): No  Physical Activity: Not on file  Stress: Not on file  Social Connections: Not on file  Intimate Partner Violence: Not At Risk (07/28/2022)   Humiliation, Afraid, Rape, and Kick questionnaire    Fear of Current or Ex-Partner: No    Emotionally Abused: No    Physically Abused: No    Sexually  Abused: No    SDOH:  SDOH Screenings   Food Insecurity: No Food Insecurity (07/28/2022)  Housing: Low Risk  (07/28/2022)  Transportation Needs: No Transportation Needs (07/28/2022)  Utilities: Not At Risk (07/28/2022)  Alcohol Screen: High Risk (07/28/2022)  Tobacco Use: High Risk (08/12/2022)    Last Labs:  Admission on 08/12/2022, Discharged on 08/12/2022  Component Date Value Ref Range Status   WBC 08/12/2022 10.8 (H)  4.0 - 10.5 K/uL Final   RBC 08/12/2022 4.26  4.22 - 5.81 MIL/uL Final   Hemoglobin 08/12/2022 12.4 (L)  13.0 - 17.0 g/dL Final   HCT 16/06/9603 39.0  39.0 - 52.0 % Final   MCV 08/12/2022 91.5  80.0 - 100.0 fL Final   MCH 08/12/2022 29.1  26.0 - 34.0 pg Final   MCHC 08/12/2022 31.8  30.0 - 36.0 g/dL Final   RDW 54/05/8118 15.6 (H)  11.5 - 15.5 % Final   Platelets 08/12/2022 346  150 - 400 K/uL Final   nRBC 08/12/2022 0.0  0.0 - 0.2 % Final   Neutrophils Relative % 08/12/2022 78  % Final   Neutro Abs 08/12/2022 8.4 (H)  1.7 - 7.7 K/uL Final   Lymphocytes Relative 08/12/2022 16  % Final   Lymphs Abs 08/12/2022 1.7  0.7 - 4.0 K/uL Final   Monocytes Relative 08/12/2022 6  % Final   Monocytes Absolute 08/12/2022 0.7  0.1 - 1.0 K/uL Final   Eosinophils Relative 08/12/2022 0  % Final   Eosinophils Absolute 08/12/2022 0.0  0.0 - 0.5 K/uL Final   Basophils Relative 08/12/2022 0  % Final    Basophils Absolute 08/12/2022 0.0  0.0 - 0.1 K/uL Final   Immature Granulocytes 08/12/2022 0  % Final   Abs Immature Granulocytes 08/12/2022 0.04  0.00 - 0.07 K/uL Final   Performed at [facility], [address] [place], [place] [address]   Troponin I (High Sensitivity) 08/12/2022 5  <18 ng/L Final   Comment: (NOTE) Elevated high sensitivity troponin I (hsTnI) values and significant  changes across serial measurements may suggest ACS but many other  chronic and acute conditions are known to elevate hsTnI results.  Refer to the "Links" section for chest pain algorithms and additional  guidance. Performed at [facility], [address] [place], [place] [address]    Sodium 08/12/2022 138  135 - 145 mmol/L Final   Potassium 08/12/2022 2.7 (LL)  3.5 - 5.1 mmol/L Final   CRITICAL RESULT CALLED TO, READ BACK BY AND VERIFIED WITH JACKIE DODD RN 08/12/22 0410 M KOROLESKI   Chloride 08/12/2022 102  98 - 111 mmol/L Final   CO2 08/12/2022 20 (L)  22 - 32 mmol/L Final   Glucose, Bld 08/12/2022 108 (H)  70 - 99 mg/dL Final   Glucose reference range applies only to samples taken after fasting for at least 8 hours.   BUN 08/12/2022 7  6 - 20 mg/dL Final   Creatinine, Ser 08/12/2022 0.89  0.61 - 1.24 mg/dL Final   Calcium 30/86/5784 9.7  8.9 - 10.3 mg/dL Final   Total Protein 69/62/9528 8.1  6.5 - 8.1 g/dL Final   Albumin 41/32/4401 4.1  3.5 - 5.0 g/dL Final   AST 02/72/5366 28  15 - 41 U/L Final   ALT 08/12/2022 27  0 - 44 U/L Final   Alkaline Phosphatase 08/12/2022 70  38 - 126 U/L Final   Total Bilirubin 08/12/2022 0.3  0.3 - 1.2 mg/dL Final  GFR, Estimated 08/12/2022 >60  >60 mL/min Final   Comment: (NOTE) Calculated using the CKD-EPI Creatinine Equation (2021)    Anion gap 08/12/2022 16 (H)  5 - 15 Final   Performed at [facility], [address], [place], [place] [address]   Alcohol, Ethyl (B) 08/12/2022 26 (H)  <10 mg/dL Final   Comment: (NOTE) Lowest detectable  limit for serum alcohol is 10 mg/dL.  For medical purposes only. Performed at [facility], [address], [place], [place] [address]    Magnesium 08/12/2022 1.4 (L)  1.7 - 2.4 mg/dL Final   Performed at [facility], [address] [place], [place] [address]   Troponin I (High Sensitivity) 08/12/2022 4  <18 ng/L Final   Comment: (NOTE) Elevated high sensitivity troponin I (hsTnI) values and significant  changes across serial measurements may suggest ACS but many other  chronic and acute conditions are known to elevate hsTnI results.  Refer to the "Links" section for chest pain algorithms and additional  guidance. Performed at [facility], [address] [place], [place] [address]    SARS Coronavirus 2 by RT PCR 08/12/2022 NEGATIVE  NEGATIVE Final   Comment: (NOTE) SARS-CoV-2 target nucleic acids are NOT DETECTED.  The SARS-CoV-2 RNA is generally detectable in upper respiratory specimens during the acute phase of infection. The lowest concentration of SARS-CoV-2 viral copies this assay can detect is 138 copies/mL. A negative result does not preclude SARS-Cov-2 infection and should not be used as the sole basis for treatment or other patient management decisions. A negative result may occur with  improper specimen collection/handling, submission of specimen other than nasopharyngeal swab, presence of viral mutation(s) within the areas targeted by this assay, and inadequate number of viral copies(<138 copies/mL). A negative result must be combined with clinical observations, patient history, and epidemiological information. The expected result is Negative.  Fact Sheet for Patients:  BloggerCourse.com  Fact Sheet for Healthcare Providers:  SeriousBroker.it  This test is no                          t yet approved or cleared by the [place] FDA and  has been authorized for detection and/or diagnosis of  SARS-CoV-2 by FDA under an Emergency Use Authorization (EUA). This EUA will remain  in effect (meaning this test can be used) for the duration of the COVID-19 declaration under Section 564(b)(1) of the Act, 21 U.S.C.section 360bbb-3(b)(1), unless the authorization is terminated  or revoked sooner.       Influenza A by PCR 08/12/2022 NEGATIVE  NEGATIVE Final   Influenza B by PCR 08/12/2022 NEGATIVE  NEGATIVE Final   Comment: (NOTE) The Xpert Xpress SARS-CoV-2/FLU/RSV plus assay is intended as an aid in the diagnosis of influenza from Nasopharyngeal swab specimens and should not be used as a sole basis for treatment. Nasal washings and aspirates are unacceptable for Xpert Xpress SARS-CoV-2/FLU/RSV testing.  Fact Sheet for Patients: BloggerCourse.com  Fact Sheet for Healthcare Providers: SeriousBroker.it  This test is not yet approved or cleared by the [place] FDA and has been authorized for detection and/or diagnosis of SARS-CoV-2 by FDA under an Emergency Use Authorization (EUA). This EUA will remain in effect (meaning this test can be used) for the duration of the COVID-19 declaration under Section 564(b)(1) of the Act, 21 U.S.C. section 360bbb-3(b)(1), unless the authorization is terminated or revoked.  Performed at [facility],  [address], [place], [place] [address]   Admission on 07/28/2022, Discharged on 08/03/2022  Component Date Value Ref Range Status   Cholesterol 07/30/2022 148  0 - 200 mg/dL Final   Triglycerides 60/45/4098 133  <150 mg/dL Final   HDL 11/91/4782 60  >40 mg/dL Final   Total CHOL/HDL Ratio 07/30/2022 2.5  RATIO Final   VLDL 07/30/2022 27  0 - 40 mg/dL Final   LDL Cholesterol 07/30/2022 61  0 - 99 mg/dL Final   Comment:        Total Cholesterol/HDL:CHD Risk Coronary Heart Disease Risk Table                     Men   Women  1/2 Average Risk   3.4   3.3  Average Risk       5.0    4.4  2 X Average Risk   9.6   7.1  3 X Average Risk  23.4   11.0        Use the calculated Patient Ratio above and the CHD Risk Table to determine the patient's CHD Risk.        ATP III CLASSIFICATION (LDL):  <100     mg/dL   Optimal  956-213  mg/dL   Near or Above                    Optimal  130-159  mg/dL   Borderline  086-578  mg/dL   High  >469     mg/dL   Very High Performed at [facility], [address] [place], [place] [address]    Hgb A1c MFr Bld 07/30/2022 4.6 (L)  4.8 - 5.6 % Final   Comment: (NOTE) Pre diabetes:          5.7%-6.4%  Diabetes:              >6.4%  Glycemic control for   <7.0% adults with diabetes    Mean Plasma Glucose 07/30/2022 85.32  mg/dL Final   Performed at [facility], [address] [place], [place] [address]  Admission on 07/27/2022, Discharged on 07/28/2022  Component Date Value Ref Range Status   Sodium 07/27/2022 136  135 - 145 mmol/L Final   Potassium 07/27/2022 3.4 (L)  3.5 - 5.1 mmol/L Final   Chloride 07/27/2022 100  98 - 111 mmol/L Final   CO2 07/27/2022 21 (L)  22 - 32 mmol/L Final   Glucose, Bld 07/27/2022 131 (H)  70 - 99 mg/dL Final   Glucose reference range applies only to samples taken after fasting for at least 8 hours.   BUN 07/27/2022 8  6 - 20 mg/dL Final   Creatinine, Ser 07/27/2022 1.20  0.61 - 1.24 mg/dL Final   Calcium 44/09/270 9.8  8.9 - 10.3 mg/dL Final   Total Protein 53/66/4403 9.5 (H)  6.5 - 8.1 g/dL Final   Albumin 47/42/5956 4.1  3.5 - 5.0 g/dL Final   AST 38/75/6433 34  15 - 41 U/L Final   ALT 07/27/2022 22  0 - 44 U/L Final   Alkaline Phosphatase 07/27/2022 99  38 - 126 U/L Final   Total Bilirubin 07/27/2022 0.6  0.3 - 1.2 mg/dL Final   GFR, Estimated 07/27/2022 >60  >60 mL/min Final   Comment: (NOTE) Calculated using the CKD-EPI Creatinine Equation (2021)    Anion gap 07/27/2022 15  5 - 15 Final   Performed at [facility], [address]  [place], [place] [address]   WBC 07/27/2022 11.5 (H)  4.0 - 10.5 K/uL Final   RBC 07/27/2022 4.33  4.22 - 5.81 MIL/uL Final   Hemoglobin 07/27/2022 12.5 (L)  13.0 - 17.0 g/dL Final   HCT 60/45/4098 38.6 (L)  39.0 - 52.0 % Final   MCV 07/27/2022 89.1  80.0 - 100.0 fL Final   MCH 07/27/2022 28.9  26.0 - 34.0 pg Final   MCHC 07/27/2022 32.4  30.0 - 36.0 g/dL Final   RDW 11/91/4782 15.6 (H)  11.5 - 15.5 % Final   Platelets 07/27/2022 394  150 - 400 K/uL Final   nRBC 07/27/2022 0.0  0.0 - 0.2 % Final   Neutrophils Relative % 07/27/2022 78  % Final   Neutro Abs 07/27/2022 9.1 (H)  1.7 - 7.7 K/uL Final   Lymphocytes Relative 07/27/2022 12  % Final   Lymphs Abs 07/27/2022 1.4  0.7 - 4.0 K/uL Final   Monocytes Relative 07/27/2022 7  % Final   Monocytes Absolute 07/27/2022 0.8  0.1 - 1.0 K/uL Final   Eosinophils Relative 07/27/2022 0  % Final   Eosinophils Absolute 07/27/2022 0.0  0.0 - 0.5 K/uL Final   Basophils Relative 07/27/2022 1  % Final   Basophils Absolute 07/27/2022 0.1  0.0 - 0.1 K/uL Final   Immature Granulocytes 07/27/2022 2  % Final   Abs Immature Granulocytes 07/27/2022 0.17 (H)  0.00 - 0.07 K/uL Final   Performed at [facility], [address] [place], [place] [address]   B Natriuretic Peptide 07/27/2022 12.3  0.0 - 100.0 pg/mL Final   Performed at [facility], [address], [place], [place] [address]   Troponin I (High Sensitivity) 07/27/2022 3  <18 ng/L Final   Comment: (NOTE) Elevated high sensitivity troponin I (hsTnI) values and significant  changes across serial measurements may suggest ACS but many other  chronic and acute conditions are known to elevate hsTnI results.  Refer to the "Links" section for chest pain algorithms and additional  guidance. Performed at [facility], [address] [place], [place] [address]    Salicylate Lvl 07/27/2022 <7.0 (L)  7.0 - 30.0 mg/dL Final   Performed at [facility], [address], [place], [place] [address]   Acetaminophen (Tylenol), Serum 07/27/2022 <10 (L)  10 - 30 ug/mL Final   Comment: (NOTE) Therapeutic concentrations vary significantly. A range of 10-30 ug/mL  may be an effective concentration for many patients. However, some  are best treated at concentrations outside of this range. Acetaminophen concentrations >150 ug/mL at 4 hours after ingestion  and >50 ug/mL at 12 hours after ingestion are often associated with  toxic reactions.  Performed at [facility], [address] [place], [place] [address]    Opiates 07/27/2022 NONE DETECTED  NONE DETECTED Final   Cocaine 07/27/2022 NONE DETECTED  NONE DETECTED Final   Benzodiazepines 07/27/2022 POSITIVE (A)  NONE DETECTED Final   Amphetamines 07/27/2022 POSITIVE (A)  NONE DETECTED Final   Tetrahydrocannabinol 07/27/2022 NONE DETECTED  NONE DETECTED Final   Barbiturates 07/27/2022 NONE DETECTED  NONE DETECTED Final   Comment: (NOTE) DRUG SCREEN FOR MEDICAL PURPOSES ONLY.  IF CONFIRMATION IS NEEDED FOR ANY PURPOSE, NOTIFY LAB WITHIN 5 DAYS.  LOWEST DETECTABLE LIMITS FOR URINE DRUG SCREEN Drug Class  Cutoff (ng/mL) Amphetamine and metabolites    1000 Barbiturate and metabolites    200 Benzodiazepine                 200 Opiates and metabolites        300 Cocaine and metabolites        300 THC                            50 Performed at [facility], [address] [place], [place] [address]    TSH 07/27/2022 2.958  0.350 - 4.500 uIU/mL Final   Comment: Performed by a 3rd Generation assay with a functional sensitivity of <=0.01 uIU/mL. Performed at [facility], [address] [place], [place] [address]    Alcohol, Ethyl (B) 07/27/2022 <10  <10 mg/dL Final   Comment: (NOTE) Lowest detectable limit for serum alcohol is 10 mg/dL.  For medical purposes only. Performed at [facility], [address] [place], [place] [address]    SARS Coronavirus 2 by RT PCR 07/27/2022 NEGATIVE  NEGATIVE Final   Comment: (NOTE) SARS-CoV-2 target nucleic acids are NOT DETECTED.  The SARS-CoV-2 RNA is generally detectable in upper respiratory specimens during the acute phase of infection. The lowest concentration of SARS-CoV-2 viral copies this assay can detect is 138 copies/mL. A negative result does not preclude SARS-Cov-2 infection and should not be used as the sole basis for treatment or other patient management decisions. A negative result may occur with  improper specimen collection/handling, submission of specimen other than nasopharyngeal swab, presence of viral mutation(s) within the areas targeted by this assay, and inadequate number of viral copies(<138 copies/mL). A negative result must be combined with clinical observations, patient history, and epidemiological information. The expected result is Negative.  Fact Sheet for Patients:  BloggerCourse.com  Fact Sheet for Healthcare Providers:  SeriousBroker.it  This test is no                          t yet approved or cleared by the [place] FDA and  has been authorized for detection and/or diagnosis of SARS-CoV-2 by FDA under an Emergency Use Authorization (EUA). This EUA will remain  in effect (meaning this test can be used) for the duration of the COVID-19 declaration under Section 564(b)(1) of the Act, 21 U.S.C.section 360bbb-3(b)(1), unless the authorization is terminated  or revoked sooner.       Influenza A by PCR 07/27/2022 NEGATIVE  NEGATIVE Final   Influenza B by PCR 07/27/2022 NEGATIVE  NEGATIVE Final   Comment: (NOTE) The Xpert Xpress SARS-CoV-2/FLU/RSV plus assay is intended as an aid in the diagnosis of influenza from Nasopharyngeal swab specimens and should not be used as a sole basis for treatment. Nasal washings  and aspirates are unacceptable for Xpert Xpress SARS-CoV-2/FLU/RSV testing.  Fact Sheet for Patients: BloggerCourse.com  Fact Sheet for Healthcare Providers: SeriousBroker.it  This test is not yet approved or cleared by the [place] FDA and has been authorized for detection and/or diagnosis of SARS-CoV-2 by FDA under an Emergency Use Authorization (EUA). This EUA will remain in effect (meaning this test can be used) for the duration of the COVID-19 declaration under Section 564(b)(1) of the Act, 21 U.S.C. section 360bbb-3(b)(1), unless the authorization is terminated or revoked.  Performed at [facility], [address] [place], [place] [address]   Admission  on 07/12/2022, Discharged on 07/16/2022  Component Date Value Ref Range Status   Lactic Acid, Venous 07/12/2022 1.0  0.5 - 1.9 mmol/L Final   Performed at [facility], [address] [place], [place] [address]   Sodium 07/12/2022 139  135 - 145 mmol/L Final   Potassium 07/12/2022 3.8  3.5 - 5.1 mmol/L Final   Chloride 07/12/2022 104  98 - 111 mmol/L Final   CO2 07/12/2022 22  22 - 32 mmol/L Final   Glucose, Bld 07/12/2022 90  70 - 99 mg/dL Final   Glucose reference range applies only to samples taken after fasting for at least 8 hours.   BUN 07/12/2022 14  6 - 20 mg/dL Final   Creatinine, Ser 07/12/2022 0.92  0.61 - 1.24 mg/dL Final   Calcium 29/56/2130 9.3  8.9 - 10.3 mg/dL Final   Total Protein 86/57/8469 7.9  6.5 - 8.1 g/dL Final   Albumin 62/95/2841 2.9 (L)  3.5 - 5.0 g/dL Final   AST 32/44/0102 24  15 - 41 U/L Final   ALT 07/12/2022 26  0 - 44 U/L Final   Alkaline Phosphatase 07/12/2022 121  38 - 126 U/L Final   Total Bilirubin 07/12/2022 0.6  0.3 - 1.2 mg/dL Final   GFR, Estimated 07/12/2022 >60  >60 mL/min Final   Comment: (NOTE) Calculated using the CKD-EPI Creatinine Equation (2021)    Anion gap 07/12/2022 13  5 - 15 Final    Performed at [facility], [address], [place], [place] [address]   WBC 07/12/2022 9.7  4.0 - 10.5 K/uL Final   RBC 07/12/2022 3.74 (L)  4.22 - 5.81 MIL/uL Final   Hemoglobin 07/12/2022 11.0 (L)  13.0 - 17.0 g/dL Final   HCT 64/40/3474 33.9 (L)  39.0 - 52.0 % Final   MCV 07/12/2022 90.6  80.0 - 100.0 fL Final   MCH 07/12/2022 29.4  26.0 - 34.0 pg Final   MCHC 07/12/2022 32.4  30.0 - 36.0 g/dL Final   RDW 25/95/6387 14.7  11.5 - 15.5 % Final   Platelets 07/12/2022 879 (H)  150 - 400 K/uL Final   nRBC 07/12/2022 0.0  0.0 - 0.2 % Final   Neutrophils Relative % 07/12/2022 65  % Final   Neutro Abs 07/12/2022 6.3  1.7 - 7.7 K/uL Final   Lymphocytes Relative 07/12/2022 22  % Final   Lymphs Abs 07/12/2022 2.1  0.7 - 4.0 K/uL Final   Monocytes Relative 07/12/2022 10  % Final   Monocytes Absolute 07/12/2022 1.0  0.1 - 1.0 K/uL Final   Eosinophils Relative 07/12/2022 1  % Final   Eosinophils Absolute 07/12/2022 0.1  0.0 - 0.5 K/uL Final   Basophils Relative 07/12/2022 2  % Final   Basophils Absolute 07/12/2022 0.2 (H)  0.0 - 0.1 K/uL Final   Immature Granulocytes 07/12/2022 0  % Final   Abs Immature Granulocytes 07/12/2022 0.04  0.00 - 0.07 K/uL Final   Performed at [facility], [address], [place], [place] [address]   Color, Urine 07/12/2022 YELLOW  YELLOW Final   APPearance 07/12/2022 CLEAR  CLEAR Final   Specific Gravity, Urine 07/12/2022 >1.030 (H)  1.005 - 1.030 Final   pH 07/12/2022 6.0  5.0 - 8.0 Final   Glucose, UA 07/12/2022 NEGATIVE  NEGATIVE mg/dL Final   Hgb urine dipstick 07/12/2022 NEGATIVE  NEGATIVE Final   Bilirubin Urine 07/12/2022 NEGATIVE  NEGATIVE Final   Ketones, ur 07/12/2022 15 (A)  NEGATIVE mg/dL Final   Protein, ur 29/56/2130 NEGATIVE  NEGATIVE mg/dL Final   Nitrite 86/57/8469 NEGATIVE  NEGATIVE Final   Leukocytes,Ua 07/12/2022 NEGATIVE  NEGATIVE Final   Comment: Microscopic not done on urines with negative protein, blood, leukocytes, nitrite, or  glucose < 500 mg/dL. Performed at [facility], [address] [place], [place] [address]    HIV Screen 4th Generation wRfx 07/16/2022 Non Reactive  Non Reactive Final   Performed at [facility], [address], [place], [place] [address]   WBC 07/16/2022 10.5  4.0 - 10.5 K/uL Final   RBC 07/16/2022 4.15 (L)  4.22 - 5.81 MIL/uL Final   Hemoglobin 07/16/2022 12.1 (L)  13.0 - 17.0 g/dL Final   HCT 44/09/270 36.5 (L)  39.0 - 52.0 % Final   MCV 07/16/2022 88.0  80.0 - 100.0 fL Final   MCH 07/16/2022 29.2  26.0 - 34.0 pg Final   MCHC 07/16/2022 33.2  30.0 - 36.0 g/dL Final   RDW 53/66/4403 14.2  11.5 - 15.5 % Final   Platelets 07/16/2022 604 (H)  150 - 400 K/uL Final   nRBC 07/16/2022 0.0  0.0 - 0.2 % Final   Performed at [facility], [address] [place], [place] [address]   Sodium 07/16/2022 136  135 - 145 mmol/L Final   Potassium 07/16/2022 4.2  3.5 - 5.1 mmol/L Final   Chloride 07/16/2022 104  98 - 111 mmol/L Final   CO2 07/16/2022 24  22 - 32 mmol/L Final   Glucose, Bld 07/16/2022 117 (H)  70 - 99 mg/dL Final   Glucose reference range applies only to samples taken after fasting for at least 8 hours.   BUN 07/16/2022 10  6 - 20 mg/dL Final   Creatinine, Ser 07/16/2022 0.88  0.61 - 1.24 mg/dL Final   Calcium 95/63/8756 9.4  8.9 - 10.3 mg/dL Final   GFR, Estimated 07/16/2022 >60  >60 mL/min Final   Comment: (NOTE) Calculated using the CKD-EPI Creatinine Equation (2021)    Anion gap 07/16/2022 8  5 - 15 Final   Performed at [facility], [address] [place], [place] [address]  No results displayed because visit has over 200 results.      Allergies: Patient has no known allergies.  PTA Medications: (Not in a hospital admission)   Long Term Goals: Improvement in symptoms so as ready for discharge  Short Term Goals: Patient will verbalize feelings in meetings with treatment team members., Patient will attend at least of 50% of the groups daily.,  Pt will complete the PHQ9 on admission, day 3 and discharge., Patient will participate in completing the [place] Suicide Severity Rating Scale, Patient will score a low risk of violence for 24 hours prior to discharge, and Patient will take medications as prescribed daily.  Medical Decision Making  Patient remains voluntary.  He will be admitted to facility based crisis unit for treatment and stabilization.  Urine drug screen not resulted in emergency department, reordered.  Current medications: -Acetaminophen 650 mg every 6 as needed/mild pain -Maalox 30 mL oral every 4 as needed/digestion -Hydroxyzine 25 mg 3 times daily as needed/anxiety -Magnesium hydroxide 30 mL daily as needed/mild constipation -Trazodone 50 mg nightly as needed/sleep  CIWA Ativan protocol initiated: -Loperamide 2 to 4 mg oral as needed/diarrhea or loose stools -Lorazepam 1 mg 4 times daily x4 doses, 1 mg 3 times daily x3 doses, 1 mg 2 times daily x2 doses,  1 mg daily x1 dose -Lorazepam 1 mg every 6 hours as needed CIWA greater than 10 -Multivitamin with minerals 1 tablet daily -Ondansetron disintegrating tablet 4 mg every 6 as needed/nausea or vomiting -Thiamine injection 100 mg IM once -Thiamine tablet 100 mg daily     Recommendations  Based on my evaluation the patient does not appear to have an emergency medical condition.  Lenard Lance, FNP 08/12/22  6:54 PM

## 2022-08-13 ENCOUNTER — Encounter (HOSPITAL_COMMUNITY): Payer: Self-pay

## 2022-08-13 DIAGNOSIS — F4310 Post-traumatic stress disorder, unspecified: Secondary | ICD-10-CM | POA: Diagnosis not present

## 2022-08-13 DIAGNOSIS — F333 Major depressive disorder, recurrent, severe with psychotic symptoms: Secondary | ICD-10-CM | POA: Diagnosis not present

## 2022-08-13 DIAGNOSIS — F17210 Nicotine dependence, cigarettes, uncomplicated: Secondary | ICD-10-CM | POA: Diagnosis not present

## 2022-08-13 DIAGNOSIS — F1014 Alcohol abuse with alcohol-induced mood disorder: Secondary | ICD-10-CM | POA: Diagnosis not present

## 2022-08-13 LAB — BASIC METABOLIC PANEL
Anion gap: 12 (ref 5–15)
BUN: 5 mg/dL — ABNORMAL LOW (ref 6–20)
CO2: 23 mmol/L (ref 22–32)
Calcium: 9.2 mg/dL (ref 8.9–10.3)
Chloride: 106 mmol/L (ref 98–111)
Creatinine, Ser: 0.83 mg/dL (ref 0.61–1.24)
GFR, Estimated: >60 mL/min (ref >60)
Glucose, Bld: 90 mg/dL (ref 70–99)
Potassium: 4 mmol/L (ref 3.5–5.1)
Sodium: 141 mmol/L (ref 135–145)

## 2022-08-13 MED ORDER — FOLIC ACID 1 MG PO TABS
1.0000 mg | ORAL_TABLET | Freq: Every day | ORAL | Status: DC
  Administered 2022-08-13 – 2022-08-19 (×7): 1 mg via ORAL
  Filled 2022-08-13 (×6): qty 1
  Filled 2022-08-13: qty 14
  Filled 2022-08-13: qty 1

## 2022-08-13 MED ORDER — ADULT MULTIVITAMIN W/MINERALS CH
1.0000 | ORAL_TABLET | Freq: Every day | ORAL | Status: DC
  Administered 2022-08-13 – 2022-08-19 (×7): 1 via ORAL
  Filled 2022-08-13: qty 14
  Filled 2022-08-13 (×7): qty 1

## 2022-08-13 MED ORDER — RIVAROXABAN 20 MG PO TABS
20.0000 mg | ORAL_TABLET | Freq: Every day | ORAL | Status: DC
  Administered 2022-08-13 – 2022-08-19 (×7): 20 mg via ORAL
  Filled 2022-08-13: qty 1
  Filled 2022-08-13: qty 14
  Filled 2022-08-13 (×6): qty 1

## 2022-08-13 MED ORDER — RIVAROXABAN 20 MG PO TABS: 20.0000 mg | ORAL_TABLET | Freq: Every day | ORAL | Status: DC

## 2022-08-13 MED ORDER — NICOTINE 21 MG/24HR TD PT24
21.0000 mg | MEDICATED_PATCH | Freq: Every day | TRANSDERMAL | Status: DC
  Administered 2022-08-14 – 2022-08-15 (×2): 21 mg via TRANSDERMAL
  Filled 2022-08-13 (×5): qty 1

## 2022-08-13 MED ORDER — THIAMINE MONONITRATE 100 MG PO TABS
100.0000 mg | ORAL_TABLET | Freq: Every day | ORAL | Status: DC
  Administered 2022-08-13 – 2022-08-19 (×7): 100 mg via ORAL
  Filled 2022-08-13 (×6): qty 1
  Filled 2022-08-13: qty 14
  Filled 2022-08-13: qty 1

## 2022-08-13 MED ORDER — CHLORDIAZEPOXIDE HCL 25 MG PO CAPS
25.0000 mg | ORAL_CAPSULE | Freq: Two times a day (BID) | ORAL | Status: AC
  Administered 2022-08-14 (×2): 25 mg via ORAL
  Filled 2022-08-13 (×2): qty 1

## 2022-08-13 MED ORDER — ESCITALOPRAM OXALATE 20 MG PO TABS
20.0000 mg | ORAL_TABLET | Freq: Every day | ORAL | Status: DC
  Administered 2022-08-13 – 2022-08-19 (×7): 20 mg via ORAL
  Filled 2022-08-13: qty 14
  Filled 2022-08-13 (×2): qty 2
  Filled 2022-08-13: qty 1
  Filled 2022-08-13 (×5): qty 2

## 2022-08-13 MED ORDER — CHLORDIAZEPOXIDE HCL 25 MG PO CAPS
25.0000 mg | ORAL_CAPSULE | Freq: Once | ORAL | Status: AC
  Administered 2022-08-15: 25 mg via ORAL
  Filled 2022-08-13: qty 1

## 2022-08-13 MED ORDER — CHLORDIAZEPOXIDE HCL 25 MG PO CAPS
25.0000 mg | ORAL_CAPSULE | Freq: Three times a day (TID) | ORAL | Status: AC
  Administered 2022-08-13 (×3): 25 mg via ORAL
  Filled 2022-08-13 (×3): qty 1

## 2022-08-13 NOTE — Progress Notes (Signed)
Pt did not attend nutrition group. 

## 2022-08-13 NOTE — ED Notes (Signed)
Patient did not attend AA meeting tonight and has been on the phone consistently.

## 2022-08-13 NOTE — Tx Team (Signed)
LCSW met with patient to assess current mood, affect, physical state, and inquire about needs/goals while here in [facility] and after discharge. Patient reports he presented due to having an anxiety attack two days ago. Patient reports being "put in a situation being around the wrong crowd", and reports he shouldn't have been in it. LCSW attempted to inquire more about incident, however patient declined to speak further about it. Patient reports he has been experiencing depression and SI for the last month. Patient reports SI with plan to overdose on pills. Patient reports he was recently discharged from [facility] about two weeks ago, and reports not being connected to any outpatient services upon discharge. Patient denies being in medication, however reports receiving services via [facility] for therapy. Patient reports his last appointment was this past Monday. Patient reports he has been homeless for the last 2 years. Patient denies having any access to transportation, and reports no family support. Patient declined collateral at this time. Patient reports he is here to seek residential placement for his substance use. 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 Patient expresses an interest in [facility] placement, however reports if not accepted then he would be interest in other locations.   Patient currently denies any SI/HI/AVH. Patient aware that LCSW will send referral out to [facility] for review and will follow up to provide updates as received. Patient expressed understanding and appreciation of LCSW assistance. No other needs were reported at this time by patient.   Referral has been sent to [facility] for review.  LCSW will continue to follow and provide support to patient while on [facility] unit.    , LCSW Clinical Social  Worker [facility] Ph: [phone]   

## 2022-08-13 NOTE — Progress Notes (Signed)
Lab for BMP drawn and offsite distribution called for STAT pick up. Pt tolerated well.

## 2022-08-13 NOTE — Progress Notes (Signed)
Pt is presently resting quietly in his room. No signs of acute distress noted. Administered scheduled meds with no issue. Pt denies pain and current SI/HI/AVH. Staff will monitor for pt's safety.

## 2022-08-13 NOTE — ED Notes (Signed)
Patient resting with no sxs of distress noted - will continue to monitor for safety 

## 2022-08-13 NOTE — ED Notes (Signed)
Patient Refused to attend the nutrition group. Explained to patient this morning that it was helpful to attend these groups.

## 2022-08-13 NOTE — Progress Notes (Signed)
Pt is asleep. Respirations are even and unlabored. No distress noted. Pt refused lunch. Staff will monitor for pt's safety.

## 2022-08-13 NOTE — ED Notes (Signed)
Patient woke up and is eating a bag of chips.

## 2022-08-13 NOTE — ED Notes (Signed)
Pt is sleeping. No distress noted. Will continue to monitor safety. 

## 2022-08-13 NOTE — ED Provider Notes (Signed)
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 [place] [address]   Sodium 07/12/2022 139  135 - 145 mmol/L Final   Potassium 07/12/2022 3.8  3.5 - 5.1 mmol/L Final    Chloride 07/12/2022 104  98 - 111 mmol/L Final   CO2 07/12/2022 22  22 - 32 mmol/L Final   Glucose, Bld 07/12/2022 90  70 -  99 mg/dL Final   Glucose reference range applies only to samples taken after fasting for at least 8 hours.   BUN 07/12/2022 14  6 - 20 mg/dL Final   Creatinine, Ser 07/12/2022 0.92  0.61 - 1.24 mg/dL Final   Calcium 16/06/9603 9.3  8.9 - 10.3 mg/dL Final   Total Protein 54/05/8118 7.9  6.5 - 8.1 g/dL Final   Albumin 14/78/2956 2.9 (L)  3.5 - 5.0 g/dL Final   AST 21/30/8657 24  15 - 41 U/L Final   ALT 07/12/2022 26  0 - 44 U/L Final   Alkaline Phosphatase 07/12/2022 121  38 - 126 U/L Final   Total Bilirubin 07/12/2022 0.6  0.3 - 1.2 mg/dL Final   GFR, Estimated 07/12/2022 >60  >60 mL/min Final   Comment: (NOTE) Calculated using the CKD-EPI Creatinine Equation (2021)    Anion gap 07/12/2022 13  5 - 15 Final   Performed at [facility], [address] [place], [place] [address]   WBC 07/12/2022 9.7  4.0 - 10.5 K/uL Final   RBC 07/12/2022 3.74 (L)  4.22 - 5.81 MIL/uL Final   Hemoglobin 07/12/2022 11.0 (L)  13.0 - 17.0 g/dL Final   HCT 29/52/8413 33.9 (L)  39.0 - 52.0 % Final   MCV 07/12/2022 90.6  80.0 - 100.0 fL Final   MCH 07/12/2022 29.4  26.0 - 34.0 pg Final   MCHC 07/12/2022 32.4  30.0 - 36.0 g/dL Final   RDW 24/40/1027 14.7  11.5 - 15.5 % Final   Platelets 07/12/2022 879 (H)  150 - 400 K/uL Final   nRBC 07/12/2022 0.0  0.0 - 0.2 % Final   Neutrophils Relative % 07/12/2022 65  % Final   Neutro Abs 07/12/2022 6.3  1.7 - 7.7 K/uL Final   Lymphocytes Relative 07/12/2022 22  % Final   Lymphs Abs 07/12/2022 2.1  0.7 - 4.0 K/uL Final   Monocytes Relative 07/12/2022 10  % Final   Monocytes Absolute 07/12/2022 1.0  0.1 - 1.0 K/uL Final   Eosinophils Relative 07/12/2022 1  % Final   Eosinophils Absolute 07/12/2022 0.1  0.0 - 0.5 K/uL Final   Basophils Relative 07/12/2022 2  % Final   Basophils Absolute 07/12/2022 0.2 (H)  0.0 - 0.1 K/uL Final   Immature  Granulocytes 07/12/2022 0  % Final   Abs Immature Granulocytes 07/12/2022 0.04  0.00 - 0.07 K/uL Final   Performed at [facility], [address], [place], [place] [address]   Color, Urine 07/12/2022 YELLOW  YELLOW Final   APPearance 07/12/2022 CLEAR  CLEAR Final   Specific Gravity, Urine 07/12/2022 >1.030 (H)  1.005 - 1.030 Final   pH 07/12/2022 6.0  5.0 - 8.0 Final   Glucose, UA 07/12/2022 NEGATIVE  NEGATIVE mg/dL Final   Hgb urine dipstick 07/12/2022 NEGATIVE  NEGATIVE Final   Bilirubin Urine 07/12/2022 NEGATIVE  NEGATIVE Final   Ketones, ur 07/12/2022 15 (A)  NEGATIVE mg/dL Final   Protein, ur 44/11/4740 NEGATIVE  NEGATIVE mg/dL Final   Nitrite 59/56/3875 NEGATIVE  NEGATIVE Final   Leukocytes,Ua 07/12/2022 NEGATIVE  NEGATIVE Final   Comment: Microscopic not done on urines with negative protein, blood, leukocytes, nitrite, or glucose < 500 mg/dL. Performed at [facility], [address] [place], [place] [address]    HIV Screen 4th Generation wRfx 07/16/2022 Non Reactive  Non Reactive Final   Performed at [facility], [address] [place], [place] [address]  WBC 07/16/2022 10.5  4.0 - 10.5 K/uL Final   RBC 07/16/2022 4.15 (L)  4.22 - 5.81 MIL/uL Final   Hemoglobin 07/16/2022 12.1 (L)  13.0 - 17.0 g/dL Final   HCT 16/06/9603 36.5 (L)  39.0 - 52.0 % Final   MCV 07/16/2022 88.0  80.0 - 100.0 fL Final   MCH 07/16/2022 29.2  26.0 - 34.0 pg Final   MCHC 07/16/2022 33.2  30.0 - 36.0 g/dL Final   RDW 54/05/8118 14.2  11.5 - 15.5 % Final   Platelets 07/16/2022 604 (H)  150 - 400 K/uL Final   nRBC 07/16/2022 0.0  0.0 - 0.2 % Final   Performed at [facility], [address] [place], [place] [address]   Sodium 07/16/2022 136  135 - 145 mmol/L Final   Potassium 07/16/2022 4.2  3.5 - 5.1 mmol/L Final   Chloride 07/16/2022 104  98 - 111 mmol/L Final   CO2 07/16/2022 24  22 - 32 mmol/L Final   Glucose, Bld 07/16/2022 117 (H)  70 - 99 mg/dL Final   Glucose  reference range applies only to samples taken after fasting for at least 8 hours.   BUN 07/16/2022 10  6 - 20 mg/dL Final   Creatinine, Ser 07/16/2022 0.88  0.61 - 1.24 mg/dL Final   Calcium 95/62/1308 9.4  8.9 - 10.3 mg/dL Final   GFR, Estimated 07/16/2022 >60  >60 mL/min Final   Comment: (NOTE) Calculated using the CKD-EPI Creatinine Equation (2021)    Anion gap 07/16/2022 8  5 - 15 Final   Performed at [facility], [address] [place], [place] [address]   Blood Alcohol level:  Lab Results  Component Value Date   ETH 26 (H) 08/12/2022   ETH <10 07/27/2022   Metabolic Disorder Labs: Lab Results  Component Value Date   HGBA1C 4.6 (L) 07/30/2022   MPG 85.32 07/30/2022   No results found for: "PROLACTIN" Lab Results  Component Value Date   CHOL 148 07/30/2022   TRIG 133 07/30/2022   HDL 60 07/30/2022   CHOLHDL 2.5 07/30/2022   VLDL 27 07/30/2022   LDLCALC 61 07/30/2022   Therapeutic Lab Levels: No results found for: "LITHIUM" No results found for: "VALPROATE" No results found for: "CBMZ" Physical Findings   AUDIT    Flowsheet Row [facility]  Alcohol Use Disorder Identification Test Final Score (AUDIT) 19      CAGE-AID    Flowsheet Row ED to Hosp-Admission (Discharged) from 07/12/2022 in [facility]  SURGICAL  CAGE-AID Score 0      PHQ2-9    Flowsheet Row ED from 08/12/2022 in [facility]  PHQ-2 Total Score 3      Flowsheet Row ED from 08/12/2022 in [facility] Most recent reading at 08/12/2022  5:37 PM ED from 08/12/2022 in [facility] EMERGENCY DEPARTMENT Most recent reading at 08/12/2022 10:00 AM Admission (Discharged) [facility] Most recent reading at 07/28/2022  6:00 PM  C-SSRS RISK CATEGORY No Risk High Risk No  Risk       Musculoskeletal  Strength & Muscle Tone: within normal limits Gait & Station: unable to assess, patient laying down Patient leans: N/A   Psychiatric Specialty Exam   Presentation  General Appearance:Appropriate for Environment Eye Contact:None Speech:Clear and Coherent, Normal Rate Volume:Decreased Handedness:Right  Mood  and Affect  Mood:Depressed Affect:Appropriate, Congruent, Constricted  Thought Process  Thought Process:Coherent, Goal Directed, Linear Descriptions of Associations:Intact  Thought Content Suicidal Thoughts:Suicidal Thoughts: No (Contracted to safety.  Last SI was 11/29, passive) Homicidal Thoughts:Homicidal Thoughts: No Hallucinations:Hallucinations: None (Denied AVH) Ideas of Reference:None Thought Content:Logical  Sensorium  Memory:Immediate Good Judgment:Fair Insight:Shallow  Executive Functions  Orientation:Full (Time, Place and Person) Language:Good Concentration:Good Attention:Good Recall:Good Fund of Knowledge:Good  Psychomotor Activity  Psychomotor Activity:Psychomotor Activity: Psychomotor Retardation  Assets  Assets:Communication Skills, Desire for Improvement  Sleep  Quality:Good  Physical Exam  BP 109/70 (BP Location: Right Arm)   Pulse 90   Temp 98.9 F (37.2 C) (Tympanic)   Resp 18   SpO2 100%  Physical Exam Vitals and nursing note reviewed.  Constitutional:      General: He is not in acute distress.    Appearance: He is not ill-appearing, toxic-appearing or diaphoretic.  HENT:     Head: Normocephalic.  Pulmonary:     Effort: Pulmonary effort is normal. No respiratory distress.  Neurological:     Mental Status: He is alert.      Assessment / Plan  Total Time spent with patient: 15 minutes Treatment Plan Summary: Daily contact with patient to assess and evaluate symptoms and progress in treatment and Medication management  Principal Problem:   Alcohol abuse with alcohol-induced mood disorder  (HCC)   Keidrick Murty is a 26 y.o. male with PMH PTSD, tobacco use d/o, alcohol use d/o (no h/o sz or DT), cannabis use d/o, cocaine use d/o, methamphetamine use d/o, substance induced psychosis, suicide attempt w past IP psych admission (last time 07/2022 for), housing instability, lengthy legal history, who presented Voluntary to [facility] (08/13/2022) from [facility] for concerns of anxiety and passive SI, then admitted to [facility] for EtOH, cocaine, meth detox and residential placement.  Total duration of encounter: 1 day  AUD, severe Action stage. Daily drink about 16oz x6 beers, with last drink was 11/28. Denied h/o seizures and DT.  BAL 26, AST/ALT wnl.  Last CIWA 0 CIWA with ativan PRN per protocol with thiamine & MV supplement Continued librium taper (ends 12/2) Plan to discuss naltrexone around 72hrs after last drink.   Stimulant use d/o  Cannabis use d/o Action stage. UDS + pending. Denied ever IVDU Continued comfort PRNs Encouraged cessation  Follow-up UDS-not collected yet  H/o pulmonary embolism Hospitalized on 10/29, started on Xarelto, with goal to continue it for at least 3 months.  However patient has been nonadherent.  Plan to restart Xarelto per below.  Confirmed with day Dortha Kern, that patient is able to go to [facility] while on Xarelto. Restarted home Xarelto 20 mg with dinner x 3 months (end 11/10/22)  MDD  PTSD H/o substance-induced mood disorder and substance-induced psychosis.  As patient is currently not experiencing psychosis or mania, will hold antipsychotic that was started at [facility] last month due to substance-induced psychosis. Continued home lexapro 20 mg daily  Tobacco use d/o Contemplative stage.  NRTs Encouraged cessation    Considerations for follow-up: Recommend high risk screening every 6-12 months with HIV, RPR, hepatitis panel Recommend monitoring LFT every 6-12 months while on naltrexone PCP to follow-up on Xarelto for PE Outpatient psychiatry to  follow for mood and Lexapro  DISPO: Tentative date: 12/4-12/7 Location: [facility]-pending acceptance Is amenable to residential rehab. Will attempt to dc patient door-to-door, however if unable will discuss with CD-IOP as bridge to rehab.    Signed: Princess Bruins, DO Psychiatry Resident,  PGY-2 08/13/2022, 11:53 AM   [facility] [address] [place], [place] [address] Dept: [phone] Dept Fax: [phone]

## 2022-08-13 NOTE — Progress Notes (Signed)
Pt's CIWA was a 0.

## 2022-08-13 NOTE — ED Notes (Addendum)
Patient observed/assessed in dining room. Upon approach patient was smiling and interacting with other patients on the unit. Patient alert and oriented x 3. Patient's eye contact is evasive and affect is flat. Patient verbalized that he felt very depressed tonight due to him explaining that his 106 month old son was discharging from the hospital and he stated he would lose him. He expressed S/I with plan to cut. He denies any intent at this time and verbally contracts for safety. Patient denies thoughts of harm towards others. He denies pain. He denies A/V/H. Will continue to monitor/support.

## 2022-08-13 NOTE — BH IP Treatment Plan (Signed)
Interdisciplinary Treatment and Diagnostic Plan Update  08/13/2022 Time of Session: 8:30AM Joseph [name] MRN: [id number]  Diagnosis:  Final diagnoses:  Alcohol abuse with alcohol-induced mood disorder (HCC)  Severe episode of recurrent major depressive disorder, with psychotic features (HCC)     Current Medications:  Current Facility-Administered Medications  Medication Dose Route Frequency Provider Last Rate Last Admin   acetaminophen (TYLENOL) tablet 650 mg  650 mg Oral Q6H PRN Lenard Lance, FNP       alum & mag hydroxide-simeth (MAALOX/MYLANTA) 200-200-20 MG/5ML suspension 30 mL  30 mL Oral Q4H PRN Lenard Lance, FNP       escitalopram (LEXAPRO) tablet 20 mg  20 mg Oral Daily Princess Bruins, DO       folic acid (FOLVITE) tablet 1 mg  1 mg Oral Daily Princess Bruins, DO       hydrOXYzine (ATARAX) tablet 25 mg  25 mg Oral TID PRN Lenard Lance, FNP       loperamide (IMODIUM) capsule 2-4 mg  2-4 mg Oral PRN Lenard Lance, FNP       LORazepam (ATIVAN) tablet 1 mg  1 mg Oral Q6H PRN Lenard Lance, FNP       LORazepam (ATIVAN) tablet 1 mg  1 mg Oral QID Lenard Lance, FNP   1 mg at 08/12/22 1805   Followed by   Melene Muller ON 08/14/2022] LORazepam (ATIVAN) tablet 1 mg  1 mg Oral TID Lenard Lance, FNP       Followed by   Melene Muller ON 08/15/2022] LORazepam (ATIVAN) tablet 1 mg  1 mg Oral BID Lenard Lance, FNP       Followed by   Melene Muller ON 08/16/2022] LORazepam (ATIVAN) tablet 1 mg  1 mg Oral Daily Lenard Lance, FNP       magnesium hydroxide (MILK OF MAGNESIA) suspension 30 mL  30 mL Oral Daily PRN Lenard Lance, FNP       multivitamin with minerals tablet 1 tablet  1 tablet Oral Daily Princess Bruins, DO       nicotine (NICODERM CQ - dosed in mg/24 hours) patch 21 mg  21 mg Transdermal Daily Princess Bruins, DO       ondansetron (ZOFRAN-ODT) disintegrating tablet 4 mg  4 mg Oral Q6H PRN Lenard Lance, FNP       rivaroxaban Carlena Hurl) tablet 20 mg  20 mg Oral Daily Princess Bruins, DO       thiamine  (VITAMIN B1) tablet 100 mg  100 mg Oral Daily Princess Bruins, DO       traZODone (DESYREL) tablet 50 mg  50 mg Oral QHS PRN Lenard Lance, FNP       Current Outpatient Medications  Medication Sig Dispense Refill   escitalopram (LEXAPRO) 20 MG tablet Take 1 tablet (20 mg total) by mouth daily. 30 tablet 0   folic acid (FOLVITE) 1 MG tablet Take 1 tablet (1 mg total) by mouth daily. (Patient not taking: Reported on 07/27/2022)     hydrOXYzine (ATARAX) 25 MG tablet Take 1 tablet (25 mg total) by mouth 3 (three) times daily as needed for anxiety. 30 tablet 0   Multiple Vitamin (MULTIVITAMIN WITH MINERALS) TABS tablet Take 1 tablet by mouth daily. (Patient not taking: Reported on 07/27/2022)     nicotine (NICODERM CQ - DOSED IN MG/24 HOURS) 21 mg/24hr patch Place 1 patch (21 mg total) onto the skin daily. 28 patch 0   OLANZapine (ZYPREXA) 5 MG  tablet Take 1 tablet (5 mg total) by mouth at bedtime. 30 tablet 0   Rivaroxaban (XARELTO) 15 MG TABS tablet Take 1 tablet (15 mg total) by mouth 2 (two) times daily for 3 days. 6 tablet 0   rivaroxaban (XARELTO) 20 MG TABS tablet Take 1 tablet (20 mg total) by mouth daily. 30 tablet 0   thiamine (VITAMIN B-1) 100 MG tablet Take 1 tablet (100 mg total) by mouth daily. (Patient not taking: Reported on 07/27/2022)     PTA Medications: Prior to Admission medications   Medication Sig Start Date End Date Taking? Authorizing Provider  escitalopram (LEXAPRO) 20 MG tablet Take 1 tablet (20 mg total) by mouth daily. 08/03/22   Massengill, Harrold Donath, MD  folic acid (FOLVITE) 1 MG tablet Take 1 tablet (1 mg total) by mouth daily. Patient not taking: Reported on 07/27/2022 07/11/22   Adam Phenix, PA-C  hydrOXYzine (ATARAX) 25 MG tablet Take 1 tablet (25 mg total) by mouth 3 (three) times daily as needed for anxiety. 08/03/22   Massengill, Harrold Donath, MD  Multiple Vitamin (MULTIVITAMIN WITH MINERALS) TABS tablet Take 1 tablet by mouth daily. Patient not taking: Reported  on 07/27/2022 07/11/22   Adam Phenix, PA-C  nicotine (NICODERM CQ - DOSED IN MG/24 HOURS) 21 mg/24hr patch Place 1 patch (21 mg total) onto the skin daily. 08/03/22   Massengill, Harrold Donath, MD  OLANZapine (ZYPREXA) 5 MG tablet Take 1 tablet (5 mg total) by mouth at bedtime. 08/03/22   Massengill, Harrold Donath, MD  Rivaroxaban (XARELTO) 15 MG TABS tablet Take 1 tablet (15 mg total) by mouth 2 (two) times daily for 3 days. 08/03/22 08/06/22  Massengill, Harrold Donath, MD  rivaroxaban (XARELTO) 20 MG TABS tablet Take 1 tablet (20 mg total) by mouth daily. 08/06/22   Massengill, Harrold Donath, MD  thiamine (VITAMIN B-1) 100 MG tablet Take 1 tablet (100 mg total) by mouth daily. Patient not taking: Reported on 07/27/2022 07/16/22   Adam Phenix, PA-C    Patient Stressors: Financial difficulties   Medication change or noncompliance   Substance abuse    Patient Strengths: Ability for insight  Average or above average intelligence  Capable of independent living  General fund of knowledge   Treatment Modalities: Medication Management, Group therapy, Case management,  1 to 1 session with clinician, Psychoeducation, Recreational therapy.   Physician Treatment Plan for Primary and Secondary Diagnosis:  Final diagnoses:  Alcohol abuse with alcohol-induced mood disorder (HCC)  Severe episode of recurrent major depressive disorder, with psychotic features (HCC)   Long Term Goal(s): Improvement in symptoms so as ready for discharge  Short Term Goals: Patient will verbalize feelings in meetings with treatment team members. Patient will attend at least of 50% of the groups daily. Pt will complete the PHQ9 on admission, day 3 and discharge. Patient will participate in completing the [place] Suicide Severity Rating Scale Patient will score a low risk of violence for 24 hours prior to discharge Patient will take medications as prescribed daily.  Medication Management: Evaluate patient's response, side  effects, and tolerance of medication regimen.  Therapeutic Interventions: 1 to 1 sessions, Unit Group sessions and Medication administration.  Evaluation of Outcomes: Progressing  LCSW Treatment Plan for Primary Diagnosis:  Final diagnoses:  Alcohol abuse with alcohol-induced mood disorder (HCC)  Severe episode of recurrent major depressive disorder, with psychotic features (HCC)    Long Term Goal(s): Safe transition to appropriate next level of care at discharge.  Short Term Goals: Facilitate acceptance of mental  health diagnosis and concerns through verbal commitment to aftercare plan and appointments at discharge., Patient will identify one social support prior to discharge to aid in patient's recovery., Patient will attend AA/NA groups as scheduled., Identify minimum of 2 triggers associated with mental health/substance abuse issues with treatment team members., and Increase skills for wellness and recovery by attending 50% of scheduled groups.  Therapeutic Interventions: Assess for all discharge needs, 1 to 1 time with Child psychotherapist, Explore available resources and support systems, Assess for adequacy in community support network, Educate family and significant other(s) on suicide prevention, Complete Psychosocial Assessment, Interpersonal group therapy.  Evaluation of Outcomes: Progressing   Progress in Treatment: Attending groups: No. Participating in groups: No. Taking medication as prescribed: No. Toleration medication: N/A Family/Significant other contact made: No, will contact:  Patient declined collateral at this time Patient understands diagnosis: Yes. Discussing patient identified problems/goals with staff: Yes. Medical problems stabilized or resolved: Yes. Denies suicidal/homicidal ideation: Yes. Issues/concerns per patient self-inventory: Yes. Other: homelessness and substance use   New problem(s) identified: No, Describe:  other than reported on admission  New  Short Term/Long Term Goal(s): Safe transition to appropriate next level of care at discharge, Engage patient in therapeutic group addressing interpersonal concerns. Engage patient in aftercare planning with referrals and resources, Increase ability to appropriately verbalize feelings, Facilitate acceptance of mental health diagnosis and concerns and Identify triggers associated with mental health/substance abuse issues.    Patient Goals:  Patient is seeking residential placement at this time. Patient expresses an interest in [facility].   Discharge Plan or Barriers: LCSW will send referral for review and follow up to provide updates as received.   Reason for Continuation of Hospitalization: Depression Other; describe residential placement  Estimated Length of Stay: 3-5 days  Last 3 [place] Suicide Severity Risk Score: Flowsheet Row ED from 08/12/2022 in [facility] Most recent reading at 08/12/2022  5:37 PM ED from 08/12/2022 in [facility] EMERGENCY DEPARTMENT Most recent reading at 08/12/2022 10:00 AM Admission (Discharged[facility] Most recent reading at 07/28/2022  6:00 PM  C-SSRS RISK CATEGORY No Risk High Risk No Risk       Last PHQ 2/9 Scores:     No data to display          Scribe for Treatment Team: Lenny Pastel 08/13/2022 8:50 AM

## 2022-08-13 NOTE — Discharge Planning (Signed)
LCSW received phone call back from Admissions Coordinator Lanora Manis at [facility]. Per Lanora Manis, patient was denied due to medical reasons. LCSW inquired about medical condition and was told that no further details were provided.   LCSW provided update to patient and provided him with a list of residential facilities to follow up with to complete phone screening. Patient was also provided applications to fill out for [facility], [facility] program, and [facility]. Patient reports he will fill out what he can and provide it back to LCSW. Patient also encouraged to follow up with his PO regarding current probation restrictions and patient agreed to call. No other needs to report at this time.   Fernande Boyden, LCSW Clinical Social Worker [place] [facility] Ph: [phone]

## 2022-08-14 DIAGNOSIS — F4310 Post-traumatic stress disorder, unspecified: Secondary | ICD-10-CM | POA: Diagnosis not present

## 2022-08-14 DIAGNOSIS — F333 Major depressive disorder, recurrent, severe with psychotic symptoms: Secondary | ICD-10-CM | POA: Diagnosis not present

## 2022-08-14 DIAGNOSIS — F1014 Alcohol abuse with alcohol-induced mood disorder: Secondary | ICD-10-CM | POA: Diagnosis not present

## 2022-08-14 DIAGNOSIS — F17210 Nicotine dependence, cigarettes, uncomplicated: Secondary | ICD-10-CM | POA: Diagnosis not present

## 2022-08-14 NOTE — ED Provider Notes (Addendum)
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 Act, 21 U.S.C.section 360bbb-3(b)(1), unless the authorization is terminated  or revoked sooner.       Influenza A by PCR 07/27/2022 NEGATIVE  NEGATIVE Final   Influenza B by PCR 07/27/2022 NEGATIVE  NEGATIVE Final   Comment: (NOTE) The Xpert Xpress SARS-CoV-2/FLU/RSV plus assay is intended as an aid in  the diagnosis of influenza from Nasopharyngeal swab specimens and should not be used as a sole basis for treatment. Nasal washings and aspirates are unacceptable for  Xpert Xpress SARS-CoV-2/FLU/RSV testing.  Fact Sheet for Patients: BloggerCourse.com  Fact Sheet for Healthcare Providers: SeriousBroker.it  This test is not yet approved or cleared by the [place] FDA and has been authorized for detection and/or diagnosis of SARS-CoV-2 by FDA under an Emergency Use Authorization (EUA). This EUA will remain in effect (meaning this test can be used) for the duration of the COVID-19 declaration under Section 564(b)(1) of the Act, 21 U.S.C. section 360bbb-3(b)(1), unless the authorization is terminated or revoked.  Performed at [facility], [address] [place], [place] [address]   Admission on 07/12/2022, Discharged on 07/16/2022  Component Date Value Ref Range Status   Lactic Acid, Venous 07/12/2022 1.0  0.5 - 1.9 mmol/L Final   Performed at [facility], [address] [place], [place] [address]   Sodium 07/12/2022 139  135 - 145 mmol/L Final   Potassium 07/12/2022 3.8  3.5 - 5.1 mmol/L Final   Chloride 07/12/2022 104  98 - 111 mmol/L Final   CO2 07/12/2022 22  22 - 32 mmol/L Final   Glucose, Bld 07/12/2022 90  70 - 99 mg/dL Final   Glucose reference range applies only to samples taken after fasting for at least 8 hours.   BUN 07/12/2022 14  6 - 20 mg/dL Final   Creatinine, Ser 07/12/2022 0.92  0.61 - 1.24 mg/dL Final   Calcium 09/81/1914 9.3  8.9 - 10.3 mg/dL Final   Total Protein 78/29/5621 7.9  6.5 - 8.1 g/dL Final   Albumin 30/86/5784 2.9 (L)  3.5 - 5.0 g/dL Final   AST 69/62/9528 24  15 - 41 U/L Final   ALT 07/12/2022 26  0 - 44 U/L Final   Alkaline Phosphatase 07/12/2022 121  38 - 126 U/L Final   Total Bilirubin 07/12/2022 0.6  0.3 - 1.2 mg/dL Final   GFR, Estimated 07/12/2022 >60  >60 mL/min Final   Comment: (NOTE) Calculated using the CKD-EPI Creatinine Equation (2021)    Anion gap 07/12/2022 13  5 - 15 Final   Performed at [facility],  [address] [place], [place] [address]   WBC 07/12/2022 9.7  4.0 - 10.5 K/uL Final   RBC 07/12/2022 3.74 (L)  4.22 - 5.81 MIL/uL Final   Hemoglobin 07/12/2022 11.0 (L)  13.0 - 17.0 g/dL Final   HCT 40/06/2724 33.9 (L)  39.0 - 52.0 % Final   MCV 07/12/2022 90.6  80.0 - 100.0 fL Final   MCH 07/12/2022 29.4  26.0 - 34.0 pg Final   MCHC 07/12/2022 32.4  30.0 - 36.0 g/dL Final   RDW 36/64/4034 14.7  11.5 - 15.5 % Final   Platelets 07/12/2022 879 (H)  150 - 400 K/uL Final   nRBC 07/12/2022 0.0  0.0 - 0.2 % Final   Neutrophils Relative % 07/12/2022 65  % Final   Neutro Abs 07/12/2022 6.3  1.7 - 7.7 K/uL Final   Lymphocytes Relative 07/12/2022 22  % Final   Lymphs Abs 07/12/2022 2.1  0.7 - 4.0 K/uL Final   Monocytes Relative 07/12/2022 10  % Final   Monocytes Absolute 07/12/2022 1.0  0.1 - 1.0 K/uL Final   Eosinophils Relative 07/12/2022 1  % Final   Eosinophils Absolute 07/12/2022 0.1  0.0 - 0.5 K/uL Final   Basophils Relative 07/12/2022 2  %  Final   Basophils Absolute 07/12/2022 0.2 (H)  0.0 - 0.1 K/uL Final   Immature Granulocytes 07/12/2022 0  % Final   Abs Immature Granulocytes 07/12/2022 0.04  0.00 - 0.07 K/uL Final   Performed at [facility], [address] [place], [place] [address]   Color, Urine 07/12/2022 YELLOW  YELLOW Final   APPearance 07/12/2022 CLEAR  CLEAR Final   Specific Gravity, Urine 07/12/2022 >1.030 (H)  1.005 - 1.030 Final   pH 07/12/2022 6.0  5.0 - 8.0 Final   Glucose, UA 07/12/2022 NEGATIVE  NEGATIVE mg/dL Final   Hgb urine dipstick 07/12/2022 NEGATIVE  NEGATIVE Final   Bilirubin Urine 07/12/2022 NEGATIVE  NEGATIVE Final   Ketones, ur 07/12/2022 15 (A)  NEGATIVE mg/dL Final   Protein, ur 34/74/2595 NEGATIVE  NEGATIVE mg/dL Final   Nitrite 63/87/5643 NEGATIVE  NEGATIVE Final   Leukocytes,Ua 07/12/2022 NEGATIVE  NEGATIVE Final   Comment: Microscopic not done on urines with negative protein, blood, leukocytes, nitrite, or glucose < 500 mg/dL. Performed at  [facility], [address] [place], [place] [address]    HIV Screen 4th Generation wRfx 07/16/2022 Non Reactive  Non Reactive Final   Performed at [facility], [address] [place], [place] [address]   WBC 07/16/2022 10.5  4.0 - 10.5 K/uL Final   RBC 07/16/2022 4.15 (L)  4.22 - 5.81 MIL/uL Final   Hemoglobin 07/16/2022 12.1 (L)  13.0 - 17.0 g/dL Final   HCT 60/63/0160 36.5 (L)  39.0 - 52.0 % Final   MCV 07/16/2022 88.0  80.0 - 100.0 fL Final   MCH 07/16/2022 29.2  26.0 - 34.0 pg Final   MCHC 07/16/2022 33.2  30.0 - 36.0 g/dL Final   RDW 10/93/2355 14.2  11.5 - 15.5 % Final   Platelets 07/16/2022 604 (H)  150 - 400 K/uL Final   nRBC 07/16/2022 0.0  0.0 - 0.2 % Final   Performed at [facility], [address], [place], [place] [address]   Sodium 07/16/2022 136  135 - 145 mmol/L Final   Potassium 07/16/2022 4.2  3.5 - 5.1 mmol/L Final   Chloride 07/16/2022 104  98 - 111 mmol/L Final   CO2 07/16/2022 24  22 - 32 mmol/L Final   Glucose, Bld 07/16/2022 117 (H)  70 - 99 mg/dL Final   Glucose reference range applies only to samples taken after fasting for at least 8 hours.   BUN 07/16/2022 10  6 - 20 mg/dL Final   Creatinine, Ser 07/16/2022 0.88  0.61 - 1.24 mg/dL Final   Calcium 25/42/7062 9.4  8.9 - 10.3 mg/dL Final   GFR, Estimated 07/16/2022 >60  >60 mL/min Final   Comment: (NOTE) Calculated using the CKD-EPI Creatinine Equation (2021)    Anion gap 07/16/2022 8  5 - 15 Final   Performed at [facility], [address], [place], [place] [address]   Blood Alcohol level:  Lab Results  Component Value Date   ETH 26 (H) 08/12/2022   ETH <10 07/27/2022   Metabolic Disorder Labs: Lab Results  Component Value Date   HGBA1C 4.6 (L) 07/30/2022   MPG 85.32 07/30/2022   No results found for: "PROLACTIN" Lab Results  Component Value Date   CHOL 148 07/30/2022   TRIG 133 07/30/2022   HDL 60 07/30/2022   CHOLHDL 2.5 07/30/2022   VLDL 27 07/30/2022    LDLCALC 61 07/30/2022   Therapeutic Lab Levels: No  results found for: "LITHIUM" No results found for: "VALPROATE" No results found for: "CBMZ" Physical Findings   AUDIT    Flowsheet Row [facility]  Alcohol Use Disorder Identification Test Final Score (AUDIT) 19      CAGE-AID    Flowsheet Row ED to Hosp-Admission (Discharged) from 07/12/2022 in [facility]  SURGICAL  CAGE-AID Score 0      PHQ2-9    Flowsheet Row ED from 08/12/2022 in [facility]  PHQ-2 Total Score 3      Flowsheet Row ED from 08/12/2022 in [facility] Most recent reading at 08/12/2022  5:37 PM ED from 08/12/2022 in [facility]PART[facility] Most recent reading at 08/12/2022 10:00 AM Admission (Discharged[facility] Most recent reading at 07/28/2022  6:00 PM  C-SSRS RISK CATEGORY No Risk High Risk No Risk       Musculoskeletal  Strength & Muscle Tone: within normal limits Gait & Station: unable to assess, patient laying down Patient leans: N/A   Psychiatric Specialty Exam   Presentation  General Appearance:Appropriate for Environment Eye Contact:Fair Speech:Clear and Coherent, Normal Rate Volume:Normal Handedness:Right  Mood and Affect  Mood:Euthymic Affect:Appropriate, Congruent  Thought Process  Thought Process:Coherent, Goal Directed Descriptions of Associations:Intact  Thought Content Suicidal Thoughts:Suicidal Thoughts: No Homicidal Thoughts:Homicidal Thoughts: No Hallucinations:Hallucinations: None Ideas of Reference:None Thought Content:Logical  Sensorium  Memory:Immediate Good Judgment:Good Insight:Fair  Executive Functions  Orientation:Full (Time, Place and Person) Language:Good Concentration:Good Attention:Good Recall:Good Fund of  Knowledge:Good  Psychomotor Activity  Psychomotor Activity:Psychomotor Activity: Normal  Assets  Assets:Communication Skills, Desire for Improvement  Sleep  Quality:Good  Physical Exam  BP 110/64   Pulse 62   Temp 98.3 F (36.8 C) (Tympanic)   Resp 18   SpO2 96%  Physical Exam Vitals and nursing note reviewed.  Constitutional:      General: He is not in acute distress.    Appearance: He is not ill-appearing, toxic-appearing or diaphoretic.  HENT:     Head: Normocephalic.  Pulmonary:     Effort: Pulmonary effort is normal. No respiratory distress.  Neurological:     Mental Status: He is alert.      Assessment / Plan  Total Time spent with patient: 15 minutes Treatment Plan Summary: Daily contact with patient to assess and evaluate symptoms and progress in treatment and Medication management  Principal Problem:   Alcohol abuse with alcohol-induced mood disorder (HCC)   Joseph [name] is a 26 y.o. male with PMH PTSD, tobacco use d/o, alcohol use d/o (no h/o sz or DT), cannabis use d/o, cocaine use d/o, methamphetamine use d/o, substance induced psychosis, suicide attempt w past IP psych admission (last time 07/2022 for), housing instability, lengthy legal history, who presented [facility] to [facility] (08/13/20[facility]) from [facility] for concerns of anxiety and passive SI, then admitted to [facility] for EtOH, cocaine, meth detox and residential placement.  Total duration of encounter: 2 days  AUD, severe Action stage. Daily drink about 16oz x6 beers, with last drink was 11/28. Denied h/o seizures and DT.  BAL 26, AST/ALT wnl.  Last CIWA 0 CIWA with ativan PRN per protocol with thiamine & MV supplement Continued librium taper (ends 12/2) Plan to discuss naltrexone around 72hrs after last drink.   Stimulant use d/o  Cannabis use d/o Action stage. UDS + pending. Denied ever IVDU Continued comfort PRNs Encouraged  cessation  Follow-up UDS-not collected yet  H/o pulmonary  embolism Hospitalized on 10/29, started on Xarelto, with goal to continue it for at least 3 months.  However patient has been nonadherent.  Plan to restart Xarelto per below.  Confirmed with day Dortha Kern, that patient is able to go to day [place] while on Xarelto. Restarted home Xarelto 20 mg with dinner x 3 months (end 11/10/22)  MDD  PTSD H/o substance-induced mood disorder and substance-induced psychosis.  As patient is currently not experiencing psychosis or mania, will hold antipsychotic that was started at [facility] last month due to substance-induced psychosis. Continued home lexapro 20 mg daily  Tobacco use d/o Contemplative stage.  NRTs Encouraged cessation    Considerations for follow-up: Recommend high risk screening every 6-12 months with HIV, RPR, hepatitis panel Recommend monitoring LFT every 6-12 months while on naltrexone PCP to follow-up on Xarelto for PE Outpatient psychiatry to follow for mood and Lexapro  DISPO: Tentative date: 12/4-12/7 Location: [facility]-pending acceptance Is amenable to residential rehab. Will attempt to dc patient door-to-door, however if unable will discuss with CD-IOP as bridge to rehab.    Signed: Lamar Sprinkles, MD Psychiatry Resident, PGY-2 08/14/2022, 3:10 PM   [facility] [address] [place], [place] [address] Dept: [phone] Dept Fax: [phone]

## 2022-08-14 NOTE — ED Notes (Signed)
Patient  sleeping in no acute stress. RR even and unlabored .Environment secured .Will continue to monitor for safely. 

## 2022-08-14 NOTE — ED Notes (Signed)
Pt is in the bed sleeping. Respirations are even and unlabored. No acute distress noted. Will continue to monitor for safety. 

## 2022-08-14 NOTE — ED Notes (Signed)
Patient a&o x4. Denies SI/HI/AVH,Denies withdrawal sx now..Denies intent or plan  to harm self or others. Routine conducted according to faculty protocol .Encourge patient to notify  staff with any needs or concerns. Patient verbalized agreement and understanding.Will continue to monitor for safety. 

## 2022-08-14 NOTE — ED Notes (Signed)
Pt is sleeping. No distress noted. Will continue to monitor safety. 

## 2022-08-14 NOTE — Progress Notes (Signed)
Although patient was encouraged to stay out of bed and to make needed phone calls he did return to bed and remains there now.

## 2022-08-14 NOTE — ED Notes (Signed)
Patient has been in room all day. Alert x4. Did not come to group.  Denies SI/HI/AVH,Denies withdrawal sx now..Denies intent or plan  to harm self or others. Paralee Cancel patient to notify  staff with any needs or concerns. Patient verbalized agreement and understandingWill continue to monitor for safety.

## 2022-08-14 NOTE — Progress Notes (Signed)
Patient out of bed sitting in dayroom eating lunch.  He was encouraged to call the number that social worker gave to him to try and find aftercare placement.  He also was encouraged to call his [employer] fast streamer to find out if he can leave the county for rehab.  Patient stated he made several calls without success and will call the other numbers.  He was also encouraged to stay out of bed and be more visible and active on unit.  He denied avh shi or plan.  Will continue to encourage and observe.

## 2022-08-14 NOTE — Discharge Planning (Signed)
LCSW and MD went to speak with patient regarding disposition plan. Patient was observed lying in bed, however when prompted sat up to speak with team. Patient informed team that he has followed up with [facility] and [facility], however was told that there are no beds available at this time. Patient reports he has not filled out any of the paper applications for [facility], [employer], and the [facility] program because he is not sure if he can leave the county at this time due to probation. Patient reports he has been trying to get in contact with his PO, however has not received a phone call back. Patient explored if LCSW could follow up with his PO to confirm. Contact number provided for PO [phone] Mrs. [place]. Contact will be made with PO on Monday 12/4. No other needs were reported by the patient on today.   LCSW will continue to follow and provide support to patient while on [facility] unit.   Fernande Boyden, LCSW Clinical Social Worker [facility] Ph: [phone]

## 2022-08-14 NOTE — ED Notes (Signed)
Patient a&o x4. Denies SI/HI/AVH,Denies withdrawal sx now..Denies intent or plan  to harm self or others. Routine conducted according to faculty protocol .Encourge patient to notify  staff with any needs or concerns. Patient verbalized agreement and understaffing.Will continue to monitor for safety.

## 2022-08-14 NOTE — ED Notes (Signed)
SPIRITUALITY GROUP NOTE  Spirituality group facilitated by Wilkie Aye, MDiv, B[facility].  Group Description:  Group focused on topic of hope.  Patients participated in facilitated discussion around topic, connecting with one another around experiences and definitions for hope.  Group members ereflected on what hope looks like for them today.  Group engaged in discussion around how their definitions of hope are present today in hospital.   Modalities: Psycho-social ed, Adlerian, Narrative, MI  Patient Progress: Jarry was the only patient attending group today, so we structured group around his needs.  Jeremaine identified his son, born in June, as a source of hope and strength and noted how his view of himself and the world changed at his son's birth.  Spoke with chaplain about hopes for his son, changes in his outlook on his own time / life, and ways he is seeking support out of a desire to be present for his son.  Keric's son has been in hospital since premature birth in June and Evaristo reports he should be released soon

## 2022-08-15 DIAGNOSIS — F4310 Post-traumatic stress disorder, unspecified: Secondary | ICD-10-CM | POA: Diagnosis not present

## 2022-08-15 DIAGNOSIS — F17210 Nicotine dependence, cigarettes, uncomplicated: Secondary | ICD-10-CM | POA: Diagnosis not present

## 2022-08-15 DIAGNOSIS — F333 Major depressive disorder, recurrent, severe with psychotic symptoms: Secondary | ICD-10-CM | POA: Diagnosis not present

## 2022-08-15 DIAGNOSIS — F1014 Alcohol abuse with alcohol-induced mood disorder: Secondary | ICD-10-CM | POA: Diagnosis not present

## 2022-08-15 NOTE — ED Notes (Signed)
Pt sleeping@this time. Breathing even and unlabored. Will continue to monitor for safety 

## 2022-08-15 NOTE — ED Notes (Signed)
Pt is in the dayroom watching TV. Pt denies SI/HI/AVH. No acute distress noted. Will continue to monitor for safety. 

## 2022-08-15 NOTE — ED Provider Notes (Signed)
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 Nasopharyngeal swab specimens and should not be used as a sole basis for treatment. Nasal washings and aspirates are unacceptable for Xpert Xpress SARS-CoV-2/FLU/RSV testing.  Fact Sheet for Patients: BloggerCourse.com  Fact Sheet for Healthcare Providers: SeriousBroker.it  This test is not yet approved or cleared by the [place] FDA and has been authorized for detection and/or diagnosis of SARS-CoV-2 by FDA under an Emergency  Use Authorization (EUA). This EUA  will remain in effect (meaning this test can be used) for the duration of the COVID-19 declaration under Section 564(b)(1) of the Act, 21 U.S.C. section 360bbb-3(b)(1), unless the authorization is terminated or revoked.  Performed at [facility], [address] [place], [place] [address]   Admission on 07/12/2022, Discharged on 07/16/2022  Component Date Value Ref Range Status   Lactic Acid, Venous 07/12/2022 1.0  0.5 - 1.9 mmol/L Final   Performed at [facility], [address] [place], [place] [address]   Sodium 07/12/2022 139  135 - 145 mmol/L Final   Potassium 07/12/2022 3.8  3.5 - 5.1 mmol/L Final   Chloride 07/12/2022 104  98 - 111 mmol/L Final   CO2 07/12/2022 22  22 - 32 mmol/L Final   Glucose, Bld 07/12/2022 90  70 - 99 mg/dL Final   Glucose reference range applies only to samples taken after fasting for at least 8 hours.   BUN 07/12/2022 14  6 - 20 mg/dL Final   Creatinine, Ser 07/12/2022 0.92  0.61 - 1.24 mg/dL Final   Calcium 14/78/2956 9.3  8.9 - 10.3 mg/dL Final   Total Protein 21/30/8657 7.9  6.5 - 8.1 g/dL Final   Albumin 84/69/6295 2.9 (L)  3.5 - 5.0 g/dL Final   AST 28/41/3244 24  15 - 41 U/L Final   ALT 07/12/2022 26  0 - 44 U/L Final   Alkaline Phosphatase 07/12/2022 121  38 - 126 U/L Final   Total Bilirubin 07/12/2022 0.6  0.3 - 1.2 mg/dL Final   GFR, Estimated 07/12/2022 >60  >60 mL/min Final   Comment: (NOTE) Calculated using the CKD-EPI Creatinine Equation (2021)    Anion gap 07/12/2022 13  5 - 15 Final   Performed at [facility], [address] [place], [place] [address]   WBC 07/12/2022 9.7  4.0 - 10.5 K/uL Final   RBC 07/12/2022 3.74 (L)  4.22 - 5.81 MIL/uL Final   Hemoglobin 07/12/2022 11.0 (L)  13.0 - 17.0 g/dL Final   HCT 25/36/6440 33.9 (L)  39.0 - 52.0 % Final   MCV 07/12/2022 90.6  80.0 - 100.0 fL Final   MCH 07/12/2022 29.4  26.0 - 34.0 pg Final   MCHC 07/12/2022 32.4  30.0  - 36.0 g/dL Final   RDW 34/74/2595 14.7  11.5 - 15.5 % Final   Platelets 07/12/2022 879 (H)  150 - 400 K/uL Final   nRBC 07/12/2022 0.0  0.0 - 0.2 % Final   Neutrophils Relative % 07/12/2022 65  % Final   Neutro Abs 07/12/2022 6.3  1.7 - 7.7 K/uL Final   Lymphocytes Relative 07/12/2022 22  % Final   Lymphs Abs 07/12/2022 2.1  0.7 - 4.0 K/uL Final   Monocytes Relative 07/12/2022 10  % Final   Monocytes Absolute 07/12/2022 1.0  0.1 - 1.0 K/uL Final   Eosinophils Relative 07/12/2022 1  % Final   Eosinophils Absolute 07/12/2022 0.1  0.0 - 0.5 K/uL Final   Basophils Relative 07/12/2022 2  % Final   Basophils Absolute 07/12/2022 0.2 (H)  0.0 - 0.1 K/uL Final   Immature Granulocytes 07/12/2022 0  % Final   Abs Immature Granulocytes 07/12/2022 0.04  0.00 - 0.07 K/uL Final   Performed at [facility], [address], [place], [place] [address]   Color, Urine 07/12/2022 YELLOW  YELLOW Final   APPearance 07/12/2022 CLEAR  CLEAR Final   Specific Gravity, Urine 07/12/2022 >1.030 (H)  1.005 - 1.030 Final   pH 07/12/2022 6.0  5.0 - 8.0 Final   Glucose, UA 07/12/2022 NEGATIVE  NEGATIVE mg/dL Final   Hgb urine dipstick 07/12/2022 NEGATIVE  NEGATIVE Final   Bilirubin Urine 07/12/2022 NEGATIVE  NEGATIVE Final   Ketones, ur 07/12/2022 15 (A)  NEGATIVE mg/dL Final   Protein, ur 42/35/3614 NEGATIVE  NEGATIVE mg/dL Final   Nitrite 43/15/4008 NEGATIVE  NEGATIVE Final   Leukocytes,Ua 07/12/2022 NEGATIVE  NEGATIVE Final   Comment: Microscopic not done on urines with negative protein, blood, leukocytes, nitrite, or glucose < 500 mg/dL. Performed at [facility], [address] [place], [place] [address]    HIV Screen 4th Generation wRfx 07/16/2022 Non Reactive  Non Reactive Final   Performed at [facility], [address], [place], [place] [address]   WBC 07/16/2022 10.5  4.0 - 10.5 K/uL Final   RBC 07/16/2022 4.15 (L)  4.22 - 5.81 MIL/uL Final   Hemoglobin 07/16/2022 12.1 (L)  13.0 -  17.0 g/dL Final   HCT 67/08/4579 36.5 (L)  39.0 - 52.0 % Final   MCV 07/16/2022 88.0  80.0 - 100.0 fL Final   MCH 07/16/2022 29.2  26.0 - 34.0 pg Final   MCHC 07/16/2022 33.2  30.0 - 36.0 g/dL Final   RDW 99/83/3825 14.2  11.5 - 15.5 % Final   Platelets 07/16/2022 604 (H)  150 - 400 K/uL Final   nRBC 07/16/2022 0.0  0.0 - 0.2 % Final   Performed at [facility], [address], [place], [place] [address]   Sodium 07/16/2022 136  135 - 145 mmol/L Final   Potassium 07/16/2022 4.2  3.5 - 5.1 mmol/L Final   Chloride 07/16/2022 104  98 - 111 mmol/L Final   CO2 07/16/2022 24  22 - 32 mmol/L Final   Glucose, Bld 07/16/2022 117 (H)  70 - 99 mg/dL Final   Glucose reference range applies only to samples taken after fasting for at least 8 hours.   BUN 07/16/2022 10  6 - 20 mg/dL Final   Creatinine, Ser 07/16/2022 0.88  0.61 - 1.24 mg/dL Final   Calcium 67/34/1937 9.4  8.9 - 10.3 mg/dL Final   GFR, Estimated 07/16/2022 >60  >60 mL/min Final   Comment: (NOTE) Calculated using the CKD-EPI Creatinine Equation (2021)    Anion gap 07/16/2022 8  5 - 15 Final   Performed at [facility], [address] [place], [place] [address]   Blood Alcohol level:  Lab Results  Component Value Date   ETH 26 (H) 08/12/2022   ETH <10 07/27/2022   Metabolic Disorder Labs: Lab Results  Component Value Date   HGBA1C 4.6 (L) 07/30/2022   MPG 85.32 07/30/2022   No results found for: "PROLACTIN" Lab Results  Component Value Date   CHOL 148 07/30/2022   TRIG 133 07/30/2022   HDL 60 07/30/2022   CHOLHDL 2.5 07/30/2022   VLDL 27 07/30/2022   LDLCALC 61 07/30/2022   Therapeutic Lab Levels: No results found for: "LITHIUM" No results found for: "VALPROATE" No results found for: "CBMZ" Physical Findings   AUDIT    Flowsheet Row [facility]  Alcohol Use Disorder Identification Test Final Score (AUDIT) 19       CAGE-AID    Flowsheet Row ED to Hosp-Admission (Discharged) from 07/12/2022 in [facility]  SURGICAL  CAGE-AID Score 0  PHQ2-9    Flowsheet Row ED from 08/12/2022 in [facility]  PHQ-2 Total Score 3      Flowsheet Row ED from 08/12/2022 in [facility] Most recent reading at 08/12/2022  5:37 PM ED from 08/12/2022 in [facility] EM[facility]GENCY DEPARTMENT Most recent reading at 08/12/2022 10:00 AM Admission (Discharged) [facility] Most recent reading at 07/28/2022  6:00 PM  C-SSRS RISK CATEGORY No Risk High Risk No Risk       Musculoskeletal  Strength & Muscle Tone: within normal limits Gait & Station: unable to assess, patient laying down Patient leans: N/A   Psychiatric Specialty Exam   Presentation  General Appearance:Appropriate for Environment Eye Contact:Fair Speech:Clear and Coherent, Normal Rate Volume:Normal Handedness:Right  Mood and Affect  Mood:Depressed Affect:Appropriate  Thought Process  Thought Process:Coherent Descriptions of Associations:Intact  Thought Content Suicidal Thoughts:Suicidal Thoughts: Yes, Active SI Active Intent and/or Plan: Without Intent, Without Plan, Without Means to [employer] (Contracts for safety while in the unit) Homicidal Thoughts:Homicidal Thoughts: No Hallucinations:Hallucinations: None Ideas of Reference:None Thought Content:Logical  Sensorium  Memory:Immediate Fair, Recent Fair Judgment:Good Insight:Shallow  Executive Functions  Orientation:Full (Time, Place and Person) Language:Good Concentration:Fair Attention:Fair Recall:Good Fund of Knowledge:Good  Psychomotor Activity  Psychomotor Activity:Psychomotor Activity: Decreased  Assets  Assets:Communication Skills, Desire for Improvement  Sleep  Quality:Fair  Physical Exam  BP 110/60   Pulse 71    Temp 97.8 F (36.6 C) (Oral)   Resp 17   SpO2 100%  Physical Exam Vitals and nursing note reviewed.  Constitutional:      General: He is not in acute distress.    Appearance: He is not ill-appearing, toxic-appearing or diaphoretic.  HENT:     Head: Normocephalic.  Pulmonary:     Effort: Pulmonary effort is normal. No respiratory distress.  Neurological:     Mental Status: He is alert.      Assessment / Plan  Total Time spent with patient: 15 minutes Treatment Plan Summary: Daily contact with patient to assess and evaluate symptoms and progress in treatment and Medication management  Principal Problem:   Alcohol abuse with alcohol-induced mood disorder (HCC)   Joseph [name] is a 26 y.o. male with PMH PTSD, tobacco use d/o, alcohol use d/o (no h/o sz or DT), cannabis use d/o, cocaine use d/o, methamphetamine use d/o, substance induced psychosis, suicide attempt w past IP psych admission (last time 07/2022 for), housing instability, lengthy legal history, who presented [facility]untary to [facility] (08/13/2022) from [facility] for concerns of anxiety and passive SI, then admitted to [facility] for EtOH, cocaine, meth detox and residential placement.  Total duration of encounter: 3 days  AUD, severe Action stage. Daily drink about 16oz x6 beers, with last drink was 11/28. Denied h/o seizures and DT.  BAL 26, AST/ALT wnl.  Last CIWA 0 CIWA with ativan PRN per protocol with thiamine & MV supplement Continued librium taper (ends 12/2) Plan to discuss naltrexone around 72hrs after last drink.   Stimulant use d/o  Cannabis use d/o Action stage. UDS + pending. Denied ever IVDU Continued comfort PRNs Encouraged cessation  Follow-up UDS-not collected yet  H/o pulmonary embolism Hospitalized on 10/29, started on Xarelto, with goal to continue it for at least 3 months.  However patient has been nonadherent.  Plan to restart Xarelto per below.  Confirmed with day Dortha Kern, that patient is able to go to  day [place] while on Xarelto. Continue  home Xarelto 20 mg with dinner x 3 months (end 11/10/22)  MDD  PTSD H/o substance-induced mood disorder and substance-induced psychosis.  As patient is currently not experiencing psychosis or mania, will hold antipsychotic that was started at [facility] last month due to substance-induced psychosis. Continued home lexapro 20 mg daily  Tobacco use d/o Contemplative stage.  NRTs Encouraged cessation    Considerations for follow-up: Recommend high risk screening every 6-12 months with HIV, RPR, hepatitis panel Recommend monitoring LFT every 6-12 months while on naltrexone PCP to follow-up on Xarelto for PE Outpatient psychiatry to follow for mood and Lexapro  DISPO: Tentative date: 12/4-12/7 Location: [facility]-pending acceptance Is amenable to residential rehab. Will attempt to dc patient door-to-door, however if unable will discuss with CD-IOP as bridge to rehab.    Signed: Karsten Ro, MD Psychiatry Resident, PGY-2 08/15/2022, 10:24 AM   [facility] [address] [place], [place] [address] Dept: [phone] Dept Fax: [phone]

## 2022-08-15 NOTE — ED Notes (Signed)
Patient  sleeping in no acute stress. RR even and unlabored .Environment secured .Will continue to monitor for safely. 

## 2022-08-16 DIAGNOSIS — F4310 Post-traumatic stress disorder, unspecified: Secondary | ICD-10-CM | POA: Diagnosis not present

## 2022-08-16 DIAGNOSIS — F1014 Alcohol abuse with alcohol-induced mood disorder: Secondary | ICD-10-CM | POA: Diagnosis not present

## 2022-08-16 DIAGNOSIS — F17210 Nicotine dependence, cigarettes, uncomplicated: Secondary | ICD-10-CM | POA: Diagnosis not present

## 2022-08-16 DIAGNOSIS — F333 Major depressive disorder, recurrent, severe with psychotic symptoms: Secondary | ICD-10-CM | POA: Diagnosis not present

## 2022-08-16 NOTE — ED Notes (Addendum)
Pt is now requesting to be discharged tonight, states he is leaving with another male pt on the unit. Ene Ajibola, NP notified. 

## 2022-08-16 NOTE — Progress Notes (Signed)
Patient remains in bed asleep despite staff encouraging him to get up. He refused breakfast as is his usual routine.  Patient is malodorous and has been encouraged to shower and attend to [employer] .  He tends to get up later in the day and stays awake until late in the evening.  Will continue tp attempt to encourage him out of bed.  He denies feelings of depression.  Denies avh shi or plan.  Will monitor.

## 2022-08-16 NOTE — ED Notes (Signed)
Patient remains in bed asleep at this time.  No distress or complaint.  Will monitor.

## 2022-08-16 NOTE — ED Notes (Signed)
Pt is in the bed resting. Respirations are even and unlabored. No acute distress noted. Will continue to monitor for safety 

## 2022-08-16 NOTE — ED Notes (Signed)
Pt is sleeping. No distress noted. Will continue to monitor safety. 

## 2022-08-16 NOTE — ED Provider Notes (Addendum)
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 Act, 21 U.S.C. section 360bbb-3(b)(1), unless the authorization is terminated or revoked.  Performed at [facility], [place] [address], [place], [place] [address]   Admission on 07/12/2022, Discharged on 07/16/2022  Component Date Value Ref Range Status   Lactic Acid, Venous 07/12/2022 1.0  0.5 - 1.9 mmol/L Final   Performed  at [facility], [place] [address] [place], [place] [address]   Sodium 07/12/2022 139  135 - 145 mmol/L Final   Potassium 07/12/2022  3.8  3.5 - 5.1 mmol/L Final   Chloride 07/12/2022 104  98 - 111 mmol/L Final   CO2 07/12/2022 22  22 - 32 mmol/L Final   Glucose, Bld 07/12/2022 90  70 - 99 mg/dL Final   Glucose reference range applies only to samples taken after fasting for at least 8 hours.   BUN 07/12/2022 14  6 - 20 mg/dL Final   Creatinine, Ser 07/12/2022 0.92  0.61 - 1.24 mg/dL Final   Calcium 22/63/3354 9.3  8.9 - 10.3 mg/dL Final   Total Protein 56/25/6389 7.9  6.5 - 8.1 g/dL Final   Albumin 37/34/2876 2.9 (L)  3.5 - 5.0 g/dL Final   AST 81/15/7262 24  15 - 41 U/L Final   ALT 07/12/2022 26  0 - 44 U/L Final   Alkaline Phosphatase 07/12/2022 121  38 - 126 U/L Final   Total Bilirubin 07/12/2022 0.6  0.3 - 1.2 mg/dL Final   GFR, Estimated 07/12/2022 >60  >60 mL/min Final   Comment: (NOTE) Calculated using the CKD-EPI Creatinine Equation (2021)    Anion gap 07/12/2022 13  5 - 15 Final   Performed at [facility], [address] [place], [place] [address]   WBC 07/12/2022 9.7  4.0 - 10.5 K/uL Final   RBC 07/12/2022 3.74 (L)  4.22 - 5.81 MIL/uL Final   Hemoglobin 07/12/2022 11.0 (L)  13.0 - 17.0 g/dL Final   HCT 74/16/3845 33.9 (L)  39.0 - 52.0 % Final   MCV 07/12/2022 90.6  80.0 - 100.0 fL Final   MCH 07/12/2022 29.4  26.0 - 34.0 pg Final   MCHC 07/12/2022 32.4  30.0 - 36.0 g/dL Final   RDW 36/46/8032 14.7  11.5 - 15.5 % Final   Platelets 07/12/2022 879 (H)  150 - 400 K/uL Final   nRBC 07/12/2022 0.0  0.0 - 0.2 % Final   Neutrophils Relative % 07/12/2022 65  % Final   Neutro Abs 07/12/2022 6.3  1.7 - 7.7 K/uL Final   Lymphocytes Relative 07/12/2022 22  % Final   Lymphs Abs 07/12/2022 2.1  0.7 - 4.0 K/uL Final   Monocytes Relative 07/12/2022 10  % Final   Monocytes Absolute 07/12/2022 1.0  0.1 - 1.0 K/uL Final   Eosinophils Relative 07/12/2022 1  % Final    Eosinophils Absolute 07/12/2022 0.1  0.0 - 0.5 K/uL Final   Basophils Relative 07/12/2022 2  % Final   Basophils Absolute 07/12/2022 0.2 (H)  0.0 - 0.1 K/uL Final   Immature Granulocytes 07/12/2022 0  % Final   Abs Immature Granulocytes 07/12/2022 0.04  0.00 - 0.07 K/uL Final   Performed at [facility], [address] [place], [place] [address]   Color, Urine 07/12/2022 YELLOW  YELLOW Final   APPearance 07/12/2022 CLEAR  CLEAR Final   Specific Gravity, Urine 07/12/2022 >1.030 (H)  1.005 - 1.030 Final   pH 07/12/2022 6.0  5.0 - 8.0 Final   Glucose, UA 07/12/2022 NEGATIVE  NEGATIVE mg/dL Final   Hgb urine dipstick 07/12/2022 NEGATIVE  NEGATIVE Final   Bilirubin Urine 07/12/2022 NEGATIVE  NEGATIVE Final   Ketones, ur 07/12/2022 15 (A)  NEGATIVE mg/dL Final   Protein, ur 25/00/3704 NEGATIVE  NEGATIVE mg/dL Final   Nitrite 88/89/1694 NEGATIVE  NEGATIVE Final   Leukocytes,Ua 07/12/2022 NEGATIVE  NEGATIVE Final   Comment: Microscopic not done on urines with negative protein, blood, leukocytes, nitrite, or glucose < 500 mg/dL. Performed at [facility],  [address], [place], [place] [address]    HIV Screen 4th Generation wRfx 07/16/2022 Non Reactive  Non Reactive Final   Performed at [facility], [place] [address], [place], [place] [address]   WBC 07/16/2022 10.5  4.0 - 10.5 K/uL Final   RBC 07/16/2022 4.15 (L)  4.22 - 5.81 MIL/uL Final   Hemoglobin 07/16/2022 12.1 (L)  13.0 - 17.0 g/dL Final   HCT 07/16/2022 36.5 (L)  39.0 - 52.0 % Final   MCV 07/16/2022 88.0  80.0 - 100.0 fL Final   MCH 07/16/2022 29.2  26.0 - 34.0 pg Final   MCHC 07/16/2022 33.2  30.0 - 36.0 g/dL Final   RDW 07/16/2022 14.2  11.5 - 15.5 % Final   Platelets 07/16/2022 604 (H)  150 - 400 K/uL Final   nRBC 07/16/2022 0.0  0.0 - 0.2 % Final   Performed at [facility] [address] [place], [place] [address]   Sodium 07/16/2022 136  135 - 145 mmol/L Final   Potassium 07/16/2022 4.2  3.5 - 5.1  mmol/L Final   Chloride 07/16/2022 104  98 - 111 mmol/L Final   CO2 07/16/2022 24  22 - 32 mmol/L Final   Glucose, Bld 07/16/2022 117 (H)  70 - 99 mg/dL Final   Glucose reference range applies only to samples taken after fasting for at least 8 hours.   BUN 07/16/2022 10  6 - 20 mg/dL Final   Creatinine, Ser 07/16/2022 0.88  0.61 - 1.24 mg/dL Final   Calcium 07/16/2022 9.4  8.9 - 10.3 mg/dL Final   GFR, Estimated 07/16/2022 >60  >60 mL/min Final   Comment: (NOTE) Calculated using the CKD-EPI Creatinine Equation (2021)    Anion gap 07/16/2022 8  5 - 15 Final   Performed at [facility], [place] [address], [place], [place] [address]   Blood Alcohol level:  Lab Results  Component Value Date   ETH 26 (H) 08/12/2022   ETH <10 123XX123   Metabolic Disorder Labs: Lab Results  Component Value Date   HGBA1C 4.6 (L) 07/30/2022   MPG 85.32 07/30/2022   No results found for: "PROLACTIN" Lab Results  Component Value Date   CHOL 148 07/30/2022   TRIG 133 07/30/2022   HDL 60 07/30/2022   CHOLHDL 2.5 07/30/2022   VLDL 27 07/30/2022   LDLCALC 61 07/30/2022   Therapeutic Lab Levels: No results found for: "LITHIUM" No results found for: "VALPROATE" No results found for: "CBMZ" Physical Findings   AUDIT    Flowsheet Row Admission (Discharged) from 07/28/2022 in [place] [facility]  Alcohol Use Disorder Identification Test Final Score (AUDIT) 19      CAGE-AID    Flowsheet Row ED to Hosp-Admission (Discharged) from 07/12/2022 in [place] Score 0      [place] ED from 08/12/2022 in [facility]  PHQ-2 Total Score 6  PHQ-9 Total Score 14      Flowsheet Row ED from 08/12/2022 in [facility] Most recent reading at 08/12/2022  5:37 PM ED from 08/12/2022 in [facility] Most recent reading at  08/12/2022 10:00 AM Admission (Discharged) from 07/28/2022 in [facility] Most recent reading at 07/28/2022  6:00 PM  C-SSRS RISK CATEGORY No Risk High Risk No Risk       Musculoskeletal  Strength & Muscle Tone: within normal limits Gait & Station: unable to assess, patient laying down Patient leans: N/A   Psychiatric Specialty Exam   Presentation  General Appearance:Appropriate for Environment Eye Contact:Fair Speech:Clear and Coherent, Normal Rate Volume:Normal Handedness:Right  Mood and Affect  Mood:Depressed Affect:Appropriate  Thought Process  Thought Process:Coherent Descriptions of Associations:Intact  Thought Content Suicidal Thoughts:Suicidal Thoughts: Yes, Active SI Active Intent and/or Plan: Without Intent, Without Plan (Contracts for safety while on the unit) Homicidal Thoughts:Homicidal Thoughts: No Hallucinations:Hallucinations: None Ideas of Reference:None Thought Content:Logical  Sensorium  Memory:Immediate Fair, Recent Fair Judgment:Fair Insight:Shallow  Executive Functions  Orientation:Full (Time, Place and Person) Language:Good Concentration:Fair Attention:Fair Recall:Fair Fund of Knowledge:Fair  Psychomotor Activity  Psychomotor Activity:Psychomotor Activity: Decreased  Assets  Assets:Communication Skills, Desire for Improvement  Sleep  Quality:Good  Physical Exam  BP (!) 105/50   Pulse 72   Temp (!) 97.5 F (36.4 C) (Oral)   Resp 18   SpO2 100%  Physical Exam Vitals and nursing note reviewed.  Constitutional:      General: He is not in acute distress.    Appearance: He is not ill-appearing, toxic-appearing or diaphoretic.  HENT:     Head: Normocephalic.  Pulmonary:     Effort: Pulmonary effort is normal. No respiratory distress.  Neurological:     Mental Status: He is alert.      Assessment / Plan  Total Time spent with patient: 15 minutes Treatment Plan Summary: Daily contact with  patient to assess and evaluate symptoms and progress in treatment and Medication management  Principal Problem:   Alcohol abuse with alcohol-induced mood disorder (HCC)   Joseph [name] is a 26 y.o. male with PMH PTSD, tobacco use d/o, alcohol use d/o (no h/o sz or DT), cannabis use d/o, cocaine use d/o, methamphetamine use d/o, substance induced psychosis, suicide attempt w past IP psych admission (last time 07/2022 for), housing instability, lengthy legal history, who presented Voluntary to [facility] (08/13/2022) from [facility] for concerns of anxiety and passive SI, then admitted to [facility] for EtOH, cocaine, meth detox and residential placement.  Total duration of encounter: 4 days  AUD, severe Action stage. Daily drink about 16oz x6 beers, with last drink was 11/28. Denied h/o seizures and DT.  BAL 26, AST/ALT wnl.  Last CIWA 0 CIWA  with lithium taper completed.   Patient declined naltrexone.  Stimulant use d/o  Cannabis use d/o Action stage. UDS + . Denied ever IVDU Continued comfort PRNs Encouraged cessation  Follow-up UDS-not collected yet  H/o pulmonary embolism Hospitalized on 10/29, started on Xarelto, with goal to continue it for at least 3 months.  However patient has been nonadherent.  Plan to restart Xarelto per below.  Confirmed with day Dortha Kern, that patient is able to go to [facility] while on Xarelto. Continue home Xarelto 20 mg with dinner x 3 months (end 11/10/22)  MDD  PTSD H/o substance-induced mood disorder and substance-induced psychosis.  As patient is currently not experiencing psychosis or mania, will hold antipsychotic that was started at [facility] last month due to substance-induced psychosis. Continued home lexapro 20 mg daily  Tobacco use d/o Contemplative stage.  NRTs Encouraged cessation    Considerations for follow-up: Recommend high risk screening every 6-12 months with HIV, RPR, hepatitis panel Recommend monitoring LFT every 6-12 months while on  naltrexone PCP to follow-up on Xarelto for PE Outpatient psychiatry to follow for mood and Lexapro  DISPO: Tentative date: 12/4-12/7 Location: [facility]pending acceptance Is amenable to  residential rehab. Will attempt to dc patient door-to-door, however if unable will discuss with CD-IOP as bridge to rehab.    Signed: Armando Reichert, MD Psychiatry Resident, PGY-2 08/16/2022, 1:07 PM   [facility] [address] [place], [place] [address] Dept: [phone] Dept Fax: [phone]

## 2022-08-16 NOTE — Progress Notes (Signed)
Patient slept entire day despite staff encouraging him to get out of bed.  Patient now awake for dinner.  He is silly and sarcastic.  Patient entered the kitchen area which is off limits to patients.  When limits set by staff he joked and laughed about it.  Unit rules reinforced.

## 2022-08-16 NOTE — ED Notes (Addendum)
Pt now stating he doesn't want to leave tonight, but wants to leave tomorrow with the other male pt on the unit. NP updated. RN encouraged pt to distance himself from other pts on the unit and remain focused on his goals of treatment. Pt begins laughing and states he is going to his room "to do all the drugs." Pt yelling out to the other male pt from his room. Security present on unit. Cecilio Asper, NP and Theda Belfast, RN/AC updated. Monitoring for safety.

## 2022-08-16 NOTE — Progress Notes (Signed)
Patient malodorous.  He was encouraged to take a shower however refused to.  He was assisted with changing linens on bed.

## 2022-08-16 NOTE — ED Notes (Signed)
Pt requested for trazodone to help him sleep. 

## 2022-08-16 NOTE — ED Notes (Signed)
Pt sitting in dining room watching TV. A&O x4, calm and cooperative. Denies current SI/HI/AVH. No signs of distress noted. Monitoring for safety.  

## 2022-08-16 NOTE — ED Notes (Addendum)
Pt walked over to phone to listen in as another pt was making a call. Phones were ultimately turned off by staff due to inappropriate comments that were being made. Pt became upset stating to RN, "what are you trying to wear your big girl pants now?" Reviewed unit rules, appropriate conversations, and boundaries with pt and he verbalized understanding. Pt states he needs to contact his child. RN allowed pt to make one last phone call and informed pt the phones will be turned off for the remainder of the night. Pt verbalized understanding.

## 2022-08-17 DIAGNOSIS — F1014 Alcohol abuse with alcohol-induced mood disorder: Secondary | ICD-10-CM | POA: Diagnosis not present

## 2022-08-17 DIAGNOSIS — F17210 Nicotine dependence, cigarettes, uncomplicated: Secondary | ICD-10-CM | POA: Diagnosis not present

## 2022-08-17 DIAGNOSIS — F333 Major depressive disorder, recurrent, severe with psychotic symptoms: Secondary | ICD-10-CM | POA: Diagnosis not present

## 2022-08-17 DIAGNOSIS — F4310 Post-traumatic stress disorder, unspecified: Secondary | ICD-10-CM | POA: Diagnosis not present

## 2022-08-17 LAB — BASIC METABOLIC PANEL
Anion gap: 7 (ref 5–15)
BUN: 7 mg/dL (ref 6–20)
CO2: 28 mmol/L (ref 22–32)
Calcium: 9.3 mg/dL (ref 8.9–10.3)
Chloride: 102 mmol/L (ref 98–111)
Creatinine, Ser: 1.08 mg/dL (ref 0.61–1.24)
GFR, Estimated: >60 mL/min (ref >60)
Glucose, Bld: 95 mg/dL (ref 70–99)
Potassium: 4 mmol/L (ref 3.5–5.1)
Sodium: 137 mmol/L (ref 135–145)

## 2022-08-17 LAB — CBC
HCT: 42.6 % (ref 39.0–52.0)
Hemoglobin: 14.1 g/dL (ref 13.0–17.0)
MCH: 29.7 pg (ref 26.0–34.0)
MCHC: 33.1 g/dL (ref 30.0–36.0)
MCV: 89.9 fL (ref 80.0–100.0)
Platelets: 326 10*3/uL (ref 150–400)
RBC: 4.74 MIL/uL (ref 4.22–5.81)
RDW: 14.8 % (ref 11.5–15.5)
WBC: 5 10*3/uL (ref 4.0–10.5)
nRBC: 0 % (ref 0.0–0.2)

## 2022-08-17 LAB — MAGNESIUM: Magnesium: 2.1 mg/dL (ref 1.7–2.4)

## 2022-08-17 MED ORDER — MELATONIN 5 MG PO TABS
5.0000 mg | ORAL_TABLET | Freq: Every day | ORAL | Status: DC
  Administered 2022-08-17 – 2022-08-19 (×3): 5 mg via ORAL
  Filled 2022-08-17: qty 14
  Filled 2022-08-17 (×2): qty 1
  Filled 2022-08-17: qty 14
  Filled 2022-08-17: qty 1

## 2022-08-17 NOTE — ED Notes (Signed)
Snacks given 

## 2022-08-17 NOTE — ED Notes (Addendum)
Patient refused the nicotin patch.took all other medication after he speaking  with the provider

## 2022-08-17 NOTE — ED Notes (Signed)
Patient in group.  

## 2022-08-17 NOTE — ED Notes (Addendum)
Pt requested for melatonin to help him sleep.  Pt stated trazodone makes him too drowsy.Provider notified.

## 2022-08-17 NOTE — ED Notes (Signed)
Pt asleep in bed. Respirations even and unlabored. Monitoring for safety. 

## 2022-08-17 NOTE — ED Notes (Addendum)
Patient a&o x4. Denies SI/HI/AVH,Denies withdrawal sx now..Denies intent or plan  to harm self or others. Routine conducted according to faculty protocol .Encourge patient to notify  staff with any needs or concerns. Patient verbalized agreement and understanding.Will continue to monitor for safety. 

## 2022-08-17 NOTE — ED Notes (Signed)
Patient refuses to get out of bed. He states that he does not want his medication. Currently laying down . Denies any pain.Environment secure . Will continue to monitor . Rn will notify provider

## 2022-08-17 NOTE — ED Notes (Signed)
Pt is in the dayroom watching TV.  Respirations are even and unlabored. No acute distress noted. Will continue to monitor for safety. 

## 2022-08-17 NOTE — ED Notes (Signed)
Khyri refused to attend group

## 2022-08-17 NOTE — ED Notes (Signed)
Patient  sleeping in no acute stress. RR even and unlabored .Environment secured .Will continue to monitor for safely. 

## 2022-08-17 NOTE — ED Notes (Signed)
Pt is in the bed sleeping. Respirations are even and unlabored. No acute distress noted. Will continue to monitor for safety. 

## 2022-08-17 NOTE — ED Provider Notes (Cosign Needed Addendum)
[facility] group Note  Date: 08/17/2022 Time: 2:00 PM  Topic: Identifying thoughts and feelings to determine behaviors which drive outcomes. Group led by Dr. Mark Lewis, PhD  Participation level: Active  Participation quality: Appropriate  Affect: Appropriate  Cognitive: Appropriate  Insight: Appropriate  Engagement in group: Engaged  Shann Merrick, MD PGY-2 08/17/2022  3:11 PM [place] [facility] 

## 2022-08-17 NOTE — ED Provider Notes (Signed)
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 the Act, 21 U.S.C.section 360bbb-3(b)(1), unless the authorization is terminated  or revoked sooner.       Influenza A by PCR 07/27/2022 NEGATIVE  NEGATIVE Final   Influenza B by PCR 07/27/2022 NEGATIVE  NEGATIVE Final   Comment: (NOTE) The Xpert Xpress SARS-CoV-2/FLU/RSV plus assay is intended as an aid in the diagnosis of influenza from Nasopharyngeal swab specimens and should not be used as a sole basis for treatment. Nasal washings  and aspirates are unacceptable for Xpert  Xpress SARS-CoV-2/FLU/RSV testing.  Fact Sheet for Patients: BloggerCourse.com  Fact Sheet for Healthcare Providers: SeriousBroker.it  This test is not yet approved or cleared by the [place] FDA and has been authorized for detection and/or diagnosis of SARS-CoV-2 by FDA under an Emergency Use Authorization (EUA). This EUA will remain in effect (meaning this test can be used) for the duration of the COVID-19 declaration under Section 564(b)(1) of the Act, 21 U.S.C. section 360bbb-3(b)(1), unless the authorization is terminated or revoked.  Performed at [facility], [address], [place], [place] [address]   Admission on 07/12/2022, Discharged on 07/16/2022  Component Date Value Ref Range Status   Lactic Acid, Venous 07/12/2022 1.0  0.5 - 1.9 mmol/L Final   Performed at [facility], [address] [place], [place] [address]   Sodium 07/12/2022 139  135 - 145 mmol/L Final   Potassium 07/12/2022 3.8  3.5 - 5.1 mmol/L Final   Chloride 07/12/2022 104  98 - 111 mmol/L Final   CO2 07/12/2022 22  22 - 32 mmol/L Final   Glucose, Bld 07/12/2022 90  70 - 99 mg/dL Final   Glucose reference range applies only to samples taken after fasting for at least 8 hours.   BUN 07/12/2022 14  6 - 20 mg/dL Final   Creatinine, Ser 07/12/2022 0.92  0.61 - 1.24 mg/dL Final   Calcium 09/81/1914 9.3  8.9 - 10.3 mg/dL Final   Total Protein 78/29/5621 7.9  6.5 - 8.1 g/dL Final   Albumin 30/86/5784 2.9 (L)  3.5 - 5.0 g/dL Final   AST 69/62/9528 24  15 - 41 U/L Final   ALT 07/12/2022 26  0 - 44 U/L Final   Alkaline Phosphatase 07/12/2022 121  38 - 126 U/L Final   Total Bilirubin 07/12/2022 0.6  0.3 - 1.2 mg/dL Final   GFR, Estimated 07/12/2022 >60  >60 mL/min Final   Comment: (NOTE) Calculated using the CKD-EPI Creatinine Equation (2021)    Anion gap 07/12/2022 13  5 - 15 Final    Performed at [facility], [address], [place], [place] [address]   WBC 07/12/2022 9.7  4.0 - 10.5 K/uL Final   RBC 07/12/2022 3.74 (L)  4.22 - 5.81 MIL/uL Final   Hemoglobin 07/12/2022 11.0 (L)  13.0 - 17.0 g/dL Final   HCT 40/06/2724 33.9 (L)  39.0 - 52.0 % Final   MCV 07/12/2022 90.6  80.0 - 100.0 fL Final   MCH 07/12/2022 29.4  26.0 - 34.0 pg Final   MCHC 07/12/2022 32.4  30.0 - 36.0 g/dL Final   RDW 36/64/4034 14.7  11.5 - 15.5 % Final   Platelets 07/12/2022 879 (H)  150 - 400 K/uL Final   nRBC 07/12/2022 0.0  0.0 - 0.2 % Final   Neutrophils Relative % 07/12/2022 65  % Final   Neutro Abs 07/12/2022 6.3  1.7 - 7.7 K/uL Final   Lymphocytes Relative 07/12/2022 22  % Final   Lymphs Abs 07/12/2022 2.1  0.7 - 4.0 K/uL Final   Monocytes Relative 07/12/2022 10  % Final   Monocytes Absolute 07/12/2022 1.0  0.1 - 1.0 K/uL Final   Eosinophils Relative 07/12/2022 1  % Final   Eosinophils Absolute 07/12/2022 0.1  0.0 - 0.5 K/uL Final   Basophils Relative 07/12/2022 2  % Final   Basophils Absolute 07/12/2022 0.2 (H)  0.0 - 0.1 K/uL Final   Immature Granulocytes 07/12/2022 0  % Final   Abs  Immature Granulocytes 07/12/2022 0.04  0.00 - 0.07 K/uL Final   Performed at [facility], [address] [place], [place] [address]   Color, Urine 07/12/2022 YELLOW  YELLOW Final   APPearance 07/12/2022 CLEAR  CLEAR Final   Specific Gravity, Urine 07/12/2022 >1.030 (H)  1.005 - 1.030 Final   pH 07/12/2022 6.0  5.0 - 8.0 Final   Glucose, UA 07/12/2022 NEGATIVE  NEGATIVE mg/dL Final   Hgb urine dipstick 07/12/2022 NEGATIVE  NEGATIVE Final   Bilirubin Urine 07/12/2022 NEGATIVE  NEGATIVE Final   Ketones, ur 07/12/2022 15 (A)  NEGATIVE mg/dL Final   Protein, ur 60/45/4098 NEGATIVE  NEGATIVE mg/dL Final   Nitrite 11/91/4782 NEGATIVE  NEGATIVE Final   Leukocytes,Ua 07/12/2022 NEGATIVE  NEGATIVE Final   Comment: Microscopic not done on urines with negative protein, blood, leukocytes, nitrite, or  glucose < 500 mg/dL. Performed at [facility], [address], [place], [place] [address]    HIV Screen 4th Generation wRfx 07/16/2022 Non Reactive  Non Reactive Final   Performed at [facility], [address], [place], [place] [address]   WBC 07/16/2022 10.5  4.0 - 10.5 K/uL Final   RBC 07/16/2022 4.15 (L)  4.22 - 5.81 MIL/uL Final   Hemoglobin 07/16/2022 12.1 (L)  13.0 - 17.0 g/dL Final   HCT 78/46/9629 36.5 (L)  39.0 - 52.0 % Final   MCV 07/16/2022 88.0  80.0 - 100.0 fL Final   MCH 07/16/2022 29.2  26.0 - 34.0 pg Final   MCHC 07/16/2022 33.2  30.0 - 36.0 g/dL Final   RDW 52/84/1324 14.2  11.5 - 15.5 % Final   Platelets 07/16/2022 604 (H)  150 - 400 K/uL Final   nRBC 07/16/2022 0.0  0.0 - 0.2 % Final   Performed at [facility], [address], [place], [place] [address]   Sodium 07/16/2022 136  135 - 145 mmol/L Final   Potassium 07/16/2022 4.2  3.5 - 5.1 mmol/L Final   Chloride 07/16/2022 104  98 - 111 mmol/L Final   CO2 07/16/2022 24  22 - 32 mmol/L Final   Glucose, Bld 07/16/2022 117 (H)  70 - 99 mg/dL Final   Glucose reference range applies only to samples taken after fasting for at least 8 hours.   BUN 07/16/2022 10  6 - 20 mg/dL Final   Creatinine, Ser 07/16/2022 0.88  0.61 - 1.24 mg/dL Final   Calcium 72/53/6644 9.4  8.9 - 10.3 mg/dL Final   GFR, Estimated 07/16/2022 >60  >60 mL/min Final   Comment: (NOTE) Calculated using the CKD-EPI Creatinine Equation (2021)    Anion gap 07/16/2022 8  5 - 15 Final   Performed at [facility], [address] [place], [place] [address]   Blood Alcohol level:  Lab Results  Component Value Date   ETH 26 (H) 08/12/2022   ETH <10 07/27/2022   Metabolic Disorder Labs: Lab Results  Component Value Date   HGBA1C 4.6 (L) 07/30/2022   MPG 85.32 07/30/2022   No results found for: "PROLACTIN" Lab Results  Component Value Date   CHOL 148 07/30/2022   TRIG 133 07/30/2022   HDL 60 07/30/2022   CHOLHDL 2.5  07/30/2022   VLDL 27 07/30/2022   LDLCALC 61 07/30/2022   Therapeutic Lab Levels: No results found for: "LITHIUM" No results found for: "VALPROATE" No results found for: "CBMZ" Physical Findings   AUDIT    Flowsheet Row [facility] (Dis[facility]  Alcohol Use Disorder Identification Test Final Score (AUDIT) 19      CAGE-AID    Flowsheet Row ED to Hosp-Admission (Discharged) from 07/12/2022 in [facility]  SURGICAL  CAGE-AID Score 0      PHQ2-9    Flowsheet Row ED from 08/12/2022 in [facility]  PHQ-2 Total Score 6  PHQ-9 Total Score 14      Flowsheet Row ED from 08/12/2022 in [facility] Most recent reading at 08/12/2022  5:37 PM ED from 08/12/2022 in [facility] EMERGENCY DEPARTMENT Most recent reading at 08/12/2022 10:00 AM Admission (Discharged) [facility] Most recent reading at 07/28/2022  6:00 PM  C-SSRS RISK CATEGORY No Risk High Risk No Risk       Musculoskeletal  Strength & Muscle Tone: within normal limits Gait & Station: unable to assess, patient laying down Patient leans: N/A   Psychiatric Specialty Exam   Presentation  General Appearance:Appropriate for Environment Eye Contact:Fair Speech:Clear and Coherent, Normal Rate Volume:Normal Handedness:Right  Mood and Affect  Mood:Euthymic Affect:Appropriate, Congruent  Thought Process  Thought Process:Coherent, Goal Directed Descriptions of Associations:Intact  Thought Content Suicidal Thoughts:Suicidal Thoughts: No Homicidal Thoughts:Homicidal Thoughts: No Hallucinations:Hallucinations: None Ideas of Reference:None Thought Content:Logical  Sensorium  Memory:Immediate Good Judgment:Good Insight:Fair  Executive Functions  Orientation:Full (Time, Place and  Person) Language:Good Concentration:Good Attention:Good Recall:Good Fund of Knowledge:Good  Psychomotor Activity  Psychomotor Activity:Psychomotor Activity: Normal  Assets  Assets:Communication Skills, Desire for Improvement  Sleep  Quality:Good  Physical Exam  BP 110/71   Pulse 76   Temp 98.3 F (36.8 C) (Oral)   Resp 18   SpO2 100%  Physical Exam Vitals and nursing note reviewed.  Constitutional:      General: He is not in acute distress.    Appearance: He is not ill-appearing, toxic-appearing or diaphoretic.  HENT:     Head: Normocephalic.  Pulmonary:     Effort: Pulmonary effort is normal. No respiratory distress.  Neurological:     Mental Status: He is alert.      Assessment / Plan  Total Time spent with patient: 30 minutes Treatment Plan Summary: Daily contact with patient to assess and evaluate symptoms and progress in treatment and Medication management  Principal Problem:   Alcohol abuse with alcohol-induced mood disorder (HCC)   Joseph [name] is a 26 y.o. male with PMH PTSD, tobacco use d/o, alcohol use d/o (no h/o sz or DT), cannabis use d/o, cocaine use d/o, methamphetamine use d/o, substance induced psychosis, suicide attempt w past IP psych admission (last time 07/2022 for), housing instability, lengthy legal history, who presented [facility] to [facility] (08/13/2022) from [facility] for concerns of anxiety and passive SI, then admitted to [facility] for EtOH, cocaine, meth detox and residential placement.  Patient has remained motivated to pursue treatment, and will likely have a bed at day [facility] on Thursday, 12/7. Total duration of encounter: 5 days  AUD, severe Action stage. Daily drink about 16oz x6 beers, with last drink was 11/28. Denied h/o seizures and DT.  BAL 26, AST/ALT wnl.  CIWA 0 greater than 48 hours CIWA  with librium taper completed.   Patient declined naltrexone.  Stimulant use d/o  Cannabis use d/o Action stage. UDS + . Denied ever  IVDU Continued comfort PRNs Encouraged cessation  Follow-up UDS-not collected yet  H/o pulmonary embolism Hospitalized on 10/29,  started on Xarelto, with goal to continue it for at least 3 months.  However patient has been nonadherent.  Plan to restart Xarelto per below.  Confirmed with day Dortha Kern, that patient is able to go to day [place] while on Xarelto. Continue home Xarelto 20 mg with dinner x 3 months (end 11/10/22)  MDD  PTSD H/o substance-induced mood disorder and substance-induced psychosis.  As patient is currently not experiencing psychosis or mania, will hold antipsychotic that was started at [facility] last month due to substance-induced psychosis. Continued home lexapro 20 mg daily  Tobacco use d/o Contemplative stage.  NRTs Encouraged cessation    Considerations for follow-up: Recommend high risk screening every 6-12 months with HIV, RPR, hepatitis panel Recommend monitoring LFT every 6-12 months while on naltrexone PCP to follow-up on Xarelto for PE Outpatient psychiatry to follow for mood and Lexapro  DISPO: Tentative date: 12/4-12/7 Location: [facility]-pending acceptance Is amenable to residential rehab. Will attempt to dc patient door-to-door, however if unable will discuss with CD-IOP as bridge to rehab.    Signed: Lamar Sprinkles, MD Psychiatry Resident, PGY-2 08/17/2022, 1:37 PM   [facility] [address] [place], [place] [address] Dept: [phone] Dept Fax: [phone]

## 2022-08-17 NOTE — Discharge Planning (Signed)
LCSW spoke with patient on this morning to assess current state. Patient reports he is doing well and reports getting good rest. Patient reports no issues with his appetite. Patient was encouraged by LCSW to be more active regarding his treatment, and to refrain from laying in the bed all day. Patient expressed understanding. Patient made aware that LCSW attempted to contact his Engineer, drilling Mrs. [place] [phone], however received no answer. LCSW left voice message at 9:52am requesting phone call back.   LCSW was provided an update from MD regarding referral made to [facility]. Per update from Admissions Coordinator Shon Baton, patient will be considered for admission later this week. LCSW advised to follow up on Wednesday for a possible admission on Thursday 08/20/2022.   LCSW will continue to follow and provide updates as received.   Fernande Boyden, LCSW Clinical Social Worker [facility] Ph: [phone]

## 2022-08-17 NOTE — ED Provider Notes (Incomplete)
Behavioral Health Progress Note  Date and Time: 08/17/2022 2:08 PM Name: Joseph [name] MRN:  [id number]  Subjective:  ***  Diagnosis:  Final diagnoses:  Alcohol abuse with alcohol-induced mood disorder (HCC)  Severe episode of recurrent major depressive disorder, with psychotic features ([place])    Total Time spent with patient: {Time; 15 min - 8 hours:17441}  Past Psychiatric History: *** Past Medical History: No past medical history on file.  Past Surgical History:  Procedure Laterality Date  . CYST REMOVAL HAND  06/22/2022   Procedure: Repair left hand laceration;  Surgeon: Broadus John, MD;  Location: [facility];  Service: Vascular;;  . HEMATOMA EVACUATION Left 06/22/2022   Procedure: Hetty E[place];  Surgeon: Broadus John, MD;  Location: [facility];  Service: Vascular;  Laterality: Left;  . LAPAROTOMY N/A 06/22/2022   Procedure: EXPLORATORY LAPAROTOMY PLACEMENT OF LEFT CHEST TUBE. REPAIR OF 7 CENTIMETER RIGHT SCALP LACERATION. REPAIR OF SCALP LACERATION  AT [facility].;  Surgeon: Greer Pickerel, MD;  Location: [facility];  Service: General;  Laterality: N/A;  . WOUND EXPLORATION Left 06/22/2022   Procedure: Left chest exploration and intercostal artery ligation;  Surgeon: Broadus John, MD;  Location: [facility] OR;  Service: Vascular;  Laterality: Left;   Family History: No family history on file. Family Psychiatric  History: *** Social History:  Social History   Substance and Sexual Activity  Alcohol Use Yes  . Alcohol/week: 8.0 standard drinks of alcohol  . Types: 8 Cans of beer per week     Social History   Substance and Sexual Activity  Drug Use Yes  . Types: Benzodiazepines, Marijuana    Social History   Socioeconomic History  . Marital status: Unknown    Spouse name: Not on file  . Number of children: Not on file  . Years of education: Not on file  . Highest education level: Not on file  Occupational History  . Not on file  Tobacco Use  . Smoking status: Every Day     Packs/day: 0.50    Types: Cigarettes  . Smokeless tobacco: Current  Vaping Use  . Vaping Use: Never used  Substance and Sexual Activity  . Alcohol use: Yes    Alcohol/week: 8.0 standard drinks of alcohol    Types: 8 Cans of beer per week  . Drug use: Yes    Types: Benzodiazepines, Marijuana  . Sexual activity: Yes  Other Topics Concern  . Not on file  Social History Narrative   ** Merged History Encounter **       Social Determinants of Health   Financial Resource Strain: Not on file  Food Insecurity: No Food Insecurity (07/28/2022)   Hunger Vital Sign   . Worried About Charity fundraiser in the Last Year: Never true   . Ran Out of Food in the Last Year: Never true  Transportation Needs: No Transportation Needs (07/28/2022)   PRAPARE - Transportation   . Lack of Transportation (Medical): No   . Lack of Transportation (Non-Medical): No  Physical Activity: Not on file  Stress: Not on file  Social Connections: Not on file   SDOH:  [place]: No Food Insecurity (07/28/2022)  Housing: Low Risk  (07/28/2022)  Transportation Needs: No Transportation Needs (07/28/2022)  Utilities: Not At Risk (07/28/2022)  Alcohol Screen: High Risk (07/28/2022)  Depression (PHQ2-9): High Risk (08/16/2022)  Tobacco Use: High Risk (08/12/2022)   Additional Social History:  Sleep: {BHH GOOD/FAIR/POOR:22877}  Appetite:  {BHH GOOD/FAIR/POOR:22877}  Current Medications:  Current Facility-Administered Medications  Medication Dose Route Frequency Provider Last Rate Last Admin  . acetaminophen (TYLENOL) tablet 650 mg  650 mg Oral Q6H PRN Lucky Rathke, FNP      . alum & mag hydroxide-simeth (MAALOX/MYLANTA) 200-200-20 MG/5ML suspension 30 mL  30 mL Oral Q4H PRN Lucky Rathke, FNP      . escitalopram (LEXAPRO) tablet 20 mg  20 mg Oral Daily Merrily Brittle, DO   20 mg at 08/17/22 1211  . folic acid (FOLVITE) tablet 1 mg  1 mg Oral Daily  Merrily Brittle, DO   1 mg at 08/17/22 1212  . hydrOXYzine (ATARAX) tablet 25 mg  25 mg Oral TID PRN Lucky Rathke, FNP      . magnesium hydroxide (MILK OF MAGNESIA) suspension 30 mL  30 mL Oral Daily PRN Lucky Rathke, FNP      . multivitamin with minerals tablet 1 tablet  1 tablet Oral Daily Merrily Brittle, DO   1 tablet at 08/17/22 1214  . nicotine (NICODERM CQ - dosed in mg/24 hours) patch 21 mg  21 mg Transdermal Daily Merrily Brittle, DO   21 mg at 08/15/22 0914  . rivaroxaban (XARELTO) tablet 20 mg  20 mg Oral Q supper Merrily Brittle, DO   20 mg at 08/16/22 1613  . thiamine (VITAMIN B1) tablet 100 mg  100 mg Oral Daily Merrily Brittle, DO   100 mg at 08/17/22 1212  . traZODone (DESYREL) tablet 50 mg  50 mg Oral QHS PRN Lucky Rathke, FNP   50 mg at 08/16/22 2103   Current Outpatient Medications  Medication Sig Dispense Refill  . escitalopram (LEXAPRO) 20 MG tablet Take 1 tablet (20 mg total) by mouth daily. 30 tablet 0  . hydrOXYzine (ATARAX) 25 MG tablet Take 1 tablet (25 mg total) by mouth 3 (three) times daily as needed for anxiety. 30 tablet 0  . Multiple Vitamin (MULTIVITAMIN WITH MINERALS) TABS tablet Take 1 tablet by mouth daily.    . folic acid (FOLVITE) 1 MG tablet Take 1 tablet (1 mg total) by mouth daily. (Patient not taking: Reported on 07/27/2022)    . nicotine (NICODERM CQ - DOSED IN MG/24 HOURS) 21 mg/24hr patch Place 1 patch (21 mg total) onto the skin daily. (Patient not taking: Reported on 08/13/2022) 28 patch 0  . OLANZapine (ZYPREXA) 5 MG tablet Take 1 tablet (5 mg total) by mouth at bedtime. (Patient not taking: Reported on 08/13/2022) 30 tablet 0  . rivaroxaban (XARELTO) 20 MG TABS tablet Take 1 tablet (20 mg total) by mouth daily. (Patient not taking: Reported on 08/13/2022) 30 tablet 0  . thiamine (VITAMIN B-1) 100 MG tablet Take 1 tablet (100 mg total) by mouth daily. (Patient not taking: Reported on 07/27/2022)      Labs  Lab Results:  Admission on 08/12/2022   Component Date Value Ref Range Status  . Sodium 08/13/2022 141  135 - 145 mmol/L Final  . Potassium 08/13/2022 4.0  3.5 - 5.1 mmol/L Final  . Chloride 08/13/2022 106  98 - 111 mmol/L Final  . CO2 08/13/2022 23  22 - 32 mmol/L Final  . Glucose, Bld 08/13/2022 90  70 - 99 mg/dL Final   Glucose reference range applies only to samples taken after fasting for at least 8 hours.  . BUN 08/13/2022 5 (L)  6 - 20 mg/dL Final  . Creatinine, Ser 08/13/2022 0.83  0.61 - 1.24 mg/dL Final  . Calcium 08/13/2022 9.2  8.9 - 10.3 mg/dL Final  . GFR, Estimated 08/13/2022 >60  >60 mL/min Final   Comment: (NOTE) Calculated using the CKD-EPI Creatinine Equation (2021)   . Anion gap 08/13/2022 12  5 - 15 Final   Performed at [facility] [address], [place], [place] [address]  . WBC 08/17/2022 5.0  4.0 - 10.5 K/uL Final  . RBC 08/17/2022 4.74  4.22 - 5.81 MIL/uL Final  . Hemoglobin 08/17/2022 14.1  13.0 - 17.0 g/dL Final  . HCT 08/17/2022 42.6  39.0 - 52.0 % Final  . MCV 08/17/2022 89.9  80.0 - 100.0 fL Final  . MCH 08/17/2022 29.7  26.0 - 34.0 pg Final  . MCHC 08/17/2022 33.1  30.0 - 36.0 g/dL Final  . RDW 08/17/2022 14.8  11.5 - 15.5 % Final  . Platelets 08/17/2022 326  150 - 400 K/uL Final  . nRBC 08/17/2022 0.0  0.0 - 0.2 % Final   Performed at [facility], [place] [address] [place], [place] [address]  Admission on 08/12/2022, Discharged on 08/12/2022  Component Date Value Ref Range Status  . WBC 08/12/2022 10.8 (H)  4.0 - 10.5 K/uL Final  . RBC 08/12/2022 4.26  4.22 - 5.81 MIL/uL Final  . Hemoglobin 08/12/2022 12.4 (L)  13.0 - 17.0 g/dL Final  . HCT 08/12/2022 39.0  39.0 - 52.0 % Final  . MCV 08/12/2022 91.5  80.0 - 100.0 fL Final  . MCH 08/12/2022 29.1  26.0 - 34.0 pg Final  . MCHC 08/12/2022 31.8  30.0 - 36.0 g/dL Final  . RDW 08/12/2022 15.6 (H)  11.5 - 15.5 % Final  . Platelets 08/12/2022 346  150 - 400 K/uL Final  . nRBC 08/12/2022 0.0  0.0 - 0.2 % Final  . Neutrophils  Relative % 08/12/2022 78  % Final  . Neutro Abs 08/12/2022 8.4 (H)  1.7 - 7.7 K/uL Final  . Lymphocytes Relative 08/12/2022 16  % Final  . Lymphs Abs 08/12/2022 1.7  0.7 - 4.0 K/uL Final  . Monocytes Relative 08/12/2022 6  % Final  . Monocytes Absolute 08/12/2022 0.7  0.1 - 1.0 K/uL Final  . Eosinophils Relative 08/12/2022 0  % Final  . Eosinophils Absolute 08/12/2022 0.0  0.0 - 0.5 K/uL Final  . Basophils Relative 08/12/2022 0  % Final  . Basophils Absolute 08/12/2022 0.0  0.0 - 0.1 K/uL Final  . Immature Granulocytes 08/12/2022 0  % Final  . Abs Immature Granulocytes 08/12/2022 0.04  0.00 - 0.07 K/uL Final   Performed at [facility], [place] [address], [place], [place] [address]  . Troponin I (High Sensitivity) 08/12/2022 5  <18 ng/L Final   Comment: (NOTE) Elevated high sensitivity troponin I (hsTnI) values and significant  changes across serial measurements may suggest ACS but many other  chronic and acute conditions are known to elevate hsTnI results.  Refer to the "Links" section for chest pain algorithms and additional  guidance. Performed at [facility], [place] [address] [place], [place] [address]   . Sodium 08/12/2022 138  135 - 145 mmol/L Final  . Potassium 08/12/2022 2.7 (LL)  3.5 - 5.1 mmol/L Final   CRITICAL RESULT CALLED TO, READ BACK BY AND VERIFIED WITH Kennyth Lose DODD RN 08/12/22 0410 Wiliam Ke  . Chloride 08/12/2022 102  98 - 111 mmol/L Final  . CO2 08/12/2022 20 (L)  22 - 32 mmol/L  Final  . Glucose, Bld 08/12/2022 108 (H)  70 - 99 mg/dL Final   Glucose reference range applies only to samples taken after fasting for at least 8 hours.  . BUN 08/12/2022 7  6 - 20 mg/dL Final  . Creatinine, Ser 08/12/2022 0.89  0.61 - 1.24 mg/dL Final  . Calcium 08/12/2022 9.7  8.9 - 10.3 mg/dL Final  . Total Protein 08/12/2022 8.1  6.5 - 8.1 g/dL Final  . Albumin 08/12/2022 4.1  3.5 - 5.0 g/dL Final  . AST 08/12/2022 28  15 - 41 U/L Final  . ALT 08/12/2022 27  0 - 44 U/L  Final  . Alkaline Phosphatase 08/12/2022 70  38 - 126 U/L Final  . Total Bilirubin 08/12/2022 0.3  0.3 - 1.2 mg/dL Final  . GFR, Estimated 08/12/2022 >60  >60 mL/min Final   Comment: (NOTE) Calculated using the CKD-EPI Creatinine Equation (2021)   . Anion gap 08/12/2022 16 (H)  5 - 15 Final   Performed at [facility] [address], [place], [place] [address]  . Alcohol, Ethyl (B) 08/12/2022 26 (H)  <10 mg/dL Final   Comment: (NOTE) Lowest detectable limit for serum alcohol is 10 mg/dL.  For medical purposes only. Performed at [facility], [place] [address] [place], [place] [address]   . Magnesium 08/12/2022 1.4 (L)  1.7 - 2.4 mg/dL Final   Performed at [facility], [place] [address] [place], [place] [address]  . Troponin I (High Sensitivity) 08/12/2022 4  <18 ng/L Final   Comment: (NOTE) Elevated high sensitivity troponin I (hsTnI) values and significant  changes across serial measurements may suggest ACS but many other  chronic and acute conditions are known to elevate hsTnI results.  Refer to the "Links" section for chest pain algorithms and additional  guidance. Performed at [facility], [place] [address] [place], [place] [address]   . SARS Coronavirus 2 by RT PCR 08/12/2022 NEGATIVE  NEGATIVE Final   Comment: (NOTE) SARS-CoV-2 target nucleic acids are NOT DETECTED.  The SARS-CoV-2 RNA is generally detectable in upper respiratory specimens during the acute phase of infection. The lowest concentration of SARS-CoV-2 viral copies this assay can detect is 138 copies/mL. A negative result does not preclude SARS-Cov-2 infection and should not be used as the sole basis for treatment or other patient management decisions. A negative result may occur with  improper specimen collection/handling, submission of specimen other than nasopharyngeal swab, presence of viral mutation(s) within the areas targeted by this assay, and inadequate number of  viral copies(<138 copies/mL). A negative result must be combined with clinical observations, patient history, and epidemiological information. The expected result is Negative.  Fact Sheet for Patients:  EntrepreneurPulse.com.au  Fact Sheet for Healthcare Providers:  IncredibleEmployment.be  This test is no                          t yet approved or cleared by the [place] FDA and  has been authorized for detection and/or diagnosis of SARS-CoV-2 by FDA under an Emergency Use Authorization (EUA). This EUA will remain  in effect (meaning this test can be used) for the duration of the COVID-19 declaration under Section 564(b)(1) of the Act, 21 U.S.C.section 360bbb-3(b)(1), unless the authorization is terminated  or revoked sooner.      . Influenza A by PCR 08/12/2022 NEGATIVE  NEGATIVE Final  . Influenza B by PCR 08/12/2022 NEGATIVE  NEGATIVE Final   Comment: (NOTE) The Xpert Xpress SARS-CoV-2/FLU/RSV plus assay is intended as an aid in the diagnosis of influenza from Nasopharyngeal swab specimens and should not be used as a sole basis for treatment. Nasal washings and aspirates are unacceptable for Xpert Xpress SARS-CoV-2/FLU/RSV testing.  Fact Sheet for Patients: EntrepreneurPulse.com.au  Fact Sheet for Healthcare Providers: IncredibleEmployment.be  This test is not yet approved or cleared by the [place] FDA and has been authorized for detection and/or diagnosis of SARS-CoV-2 by FDA under an Emergency Use Authorization (EUA). This EUA will remain in effect (meaning this test can be used) for the duration of the COVID-19 declaration under Section 564(b)(1) of the Act, 21 U.S.C. section 360bbb-3(b)(1), unless the authorization is terminated or revoked.  Performed at [facility], [place] [address] [place], [place] [address]   Admission on 07/28/2022, Discharged on 08/03/2022  Component  Date Value Ref Range Status  . Cholesterol 07/30/2022 148  0 - 200 mg/dL Final  . Triglycerides 07/30/2022 133  <150 mg/dL Final  . HDL 07/30/2022 60  >40 mg/dL Final  . Total CHOL/HDL Ratio 07/30/2022 2.5  RATIO Final  . VLDL 07/30/2022 27  0 - 40 mg/dL Final  . LDL Cholesterol 07/30/2022 61  0 - 99 mg/dL Final   Comment:        Total Cholesterol/HDL:CHD Risk Coronary Heart Disease Risk Table                     Men   Women  1/2 Average Risk   3.4   3.3  Average Risk       5.0   4.4  2 X Average Risk   9.6   7.1  3 X Average Risk  23.4   11.0        Use the calculated Patient Ratio above and the CHD Risk Table to determine the patient's CHD Risk.        ATP III CLASSIFICATION (LDL):  <100     mg/dL   Optimal  100-129  mg/dL   Near or Above                    Optimal  130-159  mg/dL   Borderline  160-189  mg/dL   High  >190     mg/dL   Very High Performed at [facility] [address] [place], [place] [address]   . Hgb A1c MFr Bld 07/30/2022 4.6 (L)  4.8 - 5.6 % Final   Comment: (NOTE) Pre diabetes:          5.7%-6.4%  Diabetes:              >6.4%  Glycemic control for   <7.0% adults with diabetes   . Mean Plasma Glucose 07/30/2022 85.32  mg/dL Final   Performed at [facility] [address] [place], [place] [address]  Admission on 07/27/2022, Discharged on 07/28/2022  Component Date Value Ref Range Status  . Sodium 07/27/2022 136  135 - 145 mmol/L Final  . Potassium 07/27/2022 3.4 (L)  3.5 - 5.1 mmol/L Final  . Chloride 07/27/2022 100  98 - 111 mmol/L Final  . CO2 07/27/2022 21 (L)  22 - 32 mmol/L Final  . Glucose, Bld 07/27/2022 131 (H)  70 - 99 mg/dL Final   Glucose reference range applies only to samples taken after fasting for at least 8 hours.  . BUN 07/27/2022 8  6 -  20 mg/dL Final  . Creatinine, Ser 07/27/2022 1.20  0.61 - 1.24 mg/dL Final  . Calcium 07/27/2022 9.8  8.9 - 10.3 mg/dL Final  . Total Protein 07/27/2022 9.5 (H)   6.5 - 8.1 g/dL Final  . Albumin 07/27/2022 4.1  3.5 - 5.0 g/dL Final  . AST 07/27/2022 34  15 - 41 U/L Final  . ALT 07/27/2022 22  0 - 44 U/L Final  . Alkaline Phosphatase 07/27/2022 99  38 - 126 U/L Final  . Total Bilirubin 07/27/2022 0.6  0.3 - 1.2 mg/dL Final  . GFR, Estimated 07/27/2022 >60  >60 mL/min Final   Comment: (NOTE) Calculated using the CKD-EPI Creatinine Equation (2021)   . Anion gap 07/27/2022 15  5 - 15 Final   Performed at [facility], [place] [address], [place], [place] [address]  . WBC 07/27/2022 11.5 (H)  4.0 - 10.5 K/uL Final  . RBC 07/27/2022 4.33  4.22 - 5.81 MIL/uL Final  . Hemoglobin 07/27/2022 12.5 (L)  13.0 - 17.0 g/dL Final  . HCT 07/27/2022 38.6 (L)  39.0 - 52.0 % Final  . MCV 07/27/2022 89.1  80.0 - 100.0 fL Final  . MCH 07/27/2022 28.9  26.0 - 34.0 pg Final  . MCHC 07/27/2022 32.4  30.0 - 36.0 g/dL Final  . RDW 07/27/2022 15.6 (H)  11.5 - 15.5 % Final  . Platelets 07/27/2022 394  150 - 400 K/uL Final  . nRBC 07/27/2022 0.0  0.0 - 0.2 % Final  . Neutrophils Relative % 07/27/2022 78  % Final  . Neutro Abs 07/27/2022 9.1 (H)  1.7 - 7.7 K/uL Final  . Lymphocytes Relative 07/27/2022 12  % Final  . Lymphs Abs 07/27/2022 1.4  0.7 - 4.0 K/uL Final  . Monocytes Relative 07/27/2022 7  % Final  . Monocytes Absolute 07/27/2022 0.8  0.1 - 1.0 K/uL Final  . Eosinophils Relative 07/27/2022 0  % Final  . Eosinophils Absolute 07/27/2022 0.0  0.0 - 0.5 K/uL Final  . Basophils Relative 07/27/2022 1  % Final  . Basophils Absolute 07/27/2022 0.1  0.0 - 0.1 K/uL Final  . Immature Granulocytes 07/27/2022 2  % Final  . Abs Immature Granulocytes 07/27/2022 0.17 (H)  0.00 - 0.07 K/uL Final   Performed at [facility], [place] [address] [place], [place] [address]  . B Natriuretic Peptide 07/27/2022 12.3  0.0 - 100.0 pg/mL Final   Performed at [facility] [address] [place], [place] [address]  . Troponin I (High  Sensitivity) 07/27/2022 3  <18 ng/L Final   Comment: (NOTE) Elevated high sensitivity troponin I (hsTnI) values and significant  changes across serial measurements may suggest ACS but many other  chronic and acute conditions are known to elevate hsTnI results.  Refer to the "Links" section for chest pain algorithms and additional  guidance. Performed at [facility], [place] [address] [place], [place] [address]   . Salicylate Lvl 123XX123 <7.0 (L)  7.0 - 30.0 mg/dL Final   Performed at [facility] [address], [place], [place] [address]  . Acetaminophen (Tylenol), Serum 07/27/2022 <10 (L)  10 - 30 ug/mL Final   Comment: (NOTE) Therapeutic concentrations vary significantly. A range of 10-30 ug/mL  may be an effective concentration for many patients. However, some  are best treated at concentrations outside of this range. Acetaminophen concentrations >150 ug/mL at 4 hours after ingestion  and >50 ug/mL at 12 hours  after ingestion are often associated with  toxic reactions.  Performed at [facility], [place] [address], [place], [place] [address]   . Opiates 07/27/2022 NONE DETECTED  NONE DETECTED Final  . Cocaine 07/27/2022 NONE DETECTED  NONE DETECTED Final  . Benzodiazepines 07/27/2022 POSITIVE (A)  NONE DETECTED Final  . Amphetamines 07/27/2022 POSITIVE (A)  NONE DETECTED Final  . Tetrahydrocannabinol 07/27/2022 NONE DETECTED  NONE DETECTED Final  . Barbiturates 07/27/2022 NONE DETECTED  NONE DETECTED Final   Comment: (NOTE) DRUG SCREEN FOR MEDICAL PURPOSES ONLY.  IF CONFIRMATION IS NEEDED FOR ANY PURPOSE, NOTIFY LAB WITHIN 5 DAYS.  LOWEST DETECTABLE LIMITS FOR URINE DRUG SCREEN Drug Class                     Cutoff (ng/mL) Amphetamine and metabolites    1000 Barbiturate and metabolites    200 Benzodiazepine                 200 Opiates and metabolites        300 Cocaine and metabolites        300 THC                             50 Performed at [facility], [place] [address] [place], [place] [address]   . TSH 07/27/2022 2.958  0.350 - 4.500 uIU/mL Final   Comment: Performed by a 3rd Generation assay with a functional sensitivity of <=0.01 uIU/mL. Performed at [facility], [place] [address], [place], [place] [address]   . Alcohol, Ethyl (B) 07/27/2022 <10  <10 mg/dL Final   Comment: (NOTE) Lowest detectable limit for serum alcohol is 10 mg/dL.  For medical purposes only. Performed at [facility], [place] [address], [place], [place] [address]   . SARS Coronavirus 2 by RT PCR 07/27/2022 NEGATIVE  NEGATIVE Final   Comment: (NOTE) SARS-CoV-2 target nucleic acids are NOT DETECTED.  The SARS-CoV-2 RNA is generally detectable in upper respiratory specimens during the acute phase of infection. The lowest concentration of SARS-CoV-2 viral copies this assay can detect is 138 copies/mL. A negative result does not preclude SARS-Cov-2 infection and should not be used as the sole basis for treatment or other patient management decisions. A negative result may occur with  improper specimen collection/handling, submission of specimen other than nasopharyngeal swab, presence of viral mutation(s) within the areas targeted by this assay, and inadequate number of viral copies(<138 copies/mL). A negative result must be combined with clinical observations, patient history, and epidemiological information. The expected result is Negative.  Fact Sheet for Patients:  EntrepreneurPulse.com.au  Fact Sheet for Healthcare Providers:  IncredibleEmployment.be  This test is no                          t yet approved or cleared by the [place] FDA and  has been authorized for detection and/or diagnosis of SARS-CoV-2 by FDA under an Emergency Use Authorization (EUA). This EUA will remain  in effect (meaning this test can be used)  for the duration of the COVID-19 declaration under Section 564(b)(1) of the Act, 21 U.S.C.section 360bbb-3(b)(1), unless the authorization is terminated  or revoked sooner.      . Influenza A by PCR 07/27/2022 NEGATIVE  NEGATIVE Final  . Influenza B by PCR 07/27/2022 NEGATIVE  NEGATIVE Final   Comment: (NOTE) The Xpert Xpress  SARS-CoV-2/FLU/RSV plus assay is intended as an aid in the diagnosis of influenza from Nasopharyngeal swab specimens and should not be used as a sole basis for treatment. Nasal washings and aspirates are unacceptable for Xpert Xpress SARS-CoV-2/FLU/RSV testing.  Fact Sheet for Patients: EntrepreneurPulse.com.au  Fact Sheet for Healthcare Providers: IncredibleEmployment.be  This test is not yet approved or cleared by the [place] FDA and has been authorized for detection and/or diagnosis of SARS-CoV-2 by FDA under an Emergency Use Authorization (EUA). This EUA will remain in effect (meaning this test can be used) for the duration of the COVID-19 declaration under Section 564(b)(1) of the Act, 21 U.S.C. section 360bbb-3(b)(1), unless the authorization is terminated or revoked.  Performed at [facility], [place] [address] [place], [place] [address]   Admission on 07/12/2022, Discharged on 07/16/2022  Component Date Value Ref Range Status  . Lactic Acid, Venous 07/12/2022 1.0  0.5 - 1.9 mmol/L Final   Performed at [facility], [place] [address], [place], [place] [address]  . Sodium 07/12/2022 139  135 - 145 mmol/L Final  . Potassium 07/12/2022 3.8  3.5 - 5.1 mmol/L Final  . Chloride 07/12/2022 104  98 - 111 mmol/L Final  . CO2 07/12/2022 22  22 - 32 mmol/L Final  . Glucose, Bld 07/12/2022 90  70 - 99 mg/dL Final   Glucose reference range applies only to samples taken after fasting for at least 8 hours.  . BUN 07/12/2022 14  6 - 20 mg/dL Final  . Creatinine, Ser 07/12/2022 0.92  0.61 - 1.24  mg/dL Final  . Calcium 07/12/2022 9.3  8.9 - 10.3 mg/dL Final  . Total Protein 07/12/2022 7.9  6.5 - 8.1 g/dL Final  . Albumin 07/12/2022 2.9 (L)  3.5 - 5.0 g/dL Final  . AST 07/12/2022 24  15 - 41 U/L Final  . ALT 07/12/2022 26  0 - 44 U/L Final  . Alkaline Phosphatase 07/12/2022 121  38 - 126 U/L Final  . Total Bilirubin 07/12/2022 0.6  0.3 - 1.2 mg/dL Final  . GFR, Estimated 07/12/2022 >60  >60 mL/min Final   Comment: (NOTE) Calculated using the CKD-EPI Creatinine Equation (2021)   . Anion gap 07/12/2022 13  5 - 15 Final   Performed at [facility] [address], [place], [place] [address]  . WBC 07/12/2022 9.7  4.0 - 10.5 K/uL Final  . RBC 07/12/2022 3.74 (L)  4.22 - 5.81 MIL/uL Final  . Hemoglobin 07/12/2022 11.0 (L)  13.0 - 17.0 g/dL Final  . HCT 07/12/2022 33.9 (L)  39.0 - 52.0 % Final  . MCV 07/12/2022 90.6  80.0 - 100.0 fL Final  . MCH 07/12/2022 29.4  26.0 - 34.0 pg Final  . MCHC 07/12/2022 32.4  30.0 - 36.0 g/dL Final  . RDW 07/12/2022 14.7  11.5 - 15.5 % Final  . Platelets 07/12/2022 879 (H)  150 - 400 K/uL Final  . nRBC 07/12/2022 0.0  0.0 - 0.2 % Final  . Neutrophils Relative % 07/12/2022 65  % Final  . Neutro Abs 07/12/2022 6.3  1.7 - 7.7 K/uL Final  . Lymphocytes Relative 07/12/2022 22  % Final  . Lymphs Abs 07/12/2022 2.1  0.7 - 4.0 K/uL Final  . Monocytes Relative 07/12/2022 10  % Final  . Monocytes Absolute 07/12/2022 1.0  0.1 - 1.0 K/uL Final  . Eosinophils Relative 07/12/2022 1  % Final  . Eosinophils Absolute 07/12/2022 0.1  0.0 - 0.5  K/uL Final  . Basophils Relative 07/12/2022 2  % Final  . Basophils Absolute 07/12/2022 0.2 (H)  0.0 - 0.1 K/uL Final  . Immature Granulocytes 07/12/2022 0  % Final  . Abs Immature Granulocytes 07/12/2022 0.04  0.00 - 0.07 K/uL Final   Performed at [facility], [address], [place], [place] [address]  . Color, Urine 07/12/2022 YELLOW  YELLOW Final  . APPearance 07/12/2022 CLEAR  CLEAR Final  . Specific  Gravity, Urine 07/12/2022 >1.030 (H)  1.005 - 1.030 Final  . pH 07/12/2022 6.0  5.0 - 8.0 Final  . Glucose, UA 07/12/2022 NEGATIVE  NEGATIVE mg/dL Final  . Hgb urine dipstick 07/12/2022 NEGATIVE  NEGATIVE Final  . Bilirubin Urine 07/12/2022 NEGATIVE  NEGATIVE Final  . Ketones, ur 07/12/2022 15 (A)  NEGATIVE mg/dL Final  . Protein, ur 16/60/6301 NEGATIVE  NEGATIVE mg/dL Final  . Nitrite 60/06/9322 NEGATIVE  NEGATIVE Final  . Glori Luis 07/12/2022 NEGATIVE  NEGATIVE Final   Comment: Microscopic not done on urines with negative protein, blood, leukocytes, nitrite, or glucose < 500 mg/dL. Performed at [facility], [address] [place], [place] [address]   . HIV Screen 4th Generation wRfx 07/16/2022 Non Reactive  Non Reactive Final   Performed at [facility], [address], [place], [place] [address]  . WBC 07/16/2022 10.5  4.0 - 10.5 K/uL Final  . RBC 07/16/2022 4.15 (L)  4.22 - 5.81 MIL/uL Final  . Hemoglobin 07/16/2022 12.1 (L)  13.0 - 17.0 g/dL Final  . HCT 27/02/2375 36.5 (L)  39.0 - 52.0 % Final  . MCV 07/16/2022 88.0  80.0 - 100.0 fL Final  . MCH 07/16/2022 29.2  26.0 - 34.0 pg Final  . MCHC 07/16/2022 33.2  30.0 - 36.0 g/dL Final  . RDW 28/31/5176 14.2  11.5 - 15.5 % Final  . Platelets 07/16/2022 604 (H)  150 - 400 K/uL Final  . nRBC 07/16/2022 0.0  0.0 - 0.2 % Final   Performed at [facility], [address], [place], [place] [address]  . Sodium 07/16/2022 136  135 - 145 mmol/L Final  . Potassium 07/16/2022 4.2  3.5 - 5.1 mmol/L Final  . Chloride 07/16/2022 104  98 - 111 mmol/L Final  . CO2 07/16/2022 24  22 - 32 mmol/L Final  . Glucose, Bld 07/16/2022 117 (H)  70 - 99 mg/dL Final   Glucose reference range applies only to samples taken after fasting for at least 8 hours.  . BUN 07/16/2022 10  6 - 20 mg/dL Final  . Creatinine, Ser 07/16/2022 0.88  0.61 - 1.24 mg/dL Final  . Calcium 71/02/2693 9.4  8.9 - 10.3 mg/dL Final  . GFR, Estimated 07/16/2022 >60   >60 mL/min Final   Comment: (NOTE) Calculated using the CKD-EPI Creatinine Equation (2021)   . Anion gap 07/16/2022 8  5 - 15 Final   Performed at [facility], [address] [place], [place] [address]    Blood Alcohol level:  Lab Results  Component Value Date   ETH 26 (H) 08/12/2022   ETH <10 07/27/2022    Metabolic Disorder Labs: Lab Results  Component Value Date   HGBA1C 4.6 (L) 07/30/2022   MPG 85.32 07/30/2022   No results found for: "PROLACTIN" Lab Results  Component Value Date   CHOL 148 07/30/2022   TRIG 133 07/30/2022   HDL 60 07/30/2022   CHOLHDL 2.5 07/30/2022   VLDL 27  07/30/2022   [place] 61 07/30/2022    Therapeutic Lab Levels: No results found for: "LITHIUM" No results found for: "VALPROATE" No results found for: "CBMZ"  Physical Findings   AUDIT    Flowsheet Row Admission (Discharged) from 07/28/2022 in [place] [facility]  Alcohol Use Disorder Identification Test Final Score (AUDIT) 19      CAGE-AID    Flowsheet Row ED to Hosp-Admission (Discharged) from 07/12/2022 in [place] Score 0      PHQ2-9    [place] ED from 08/12/2022 in [facility]  PHQ-2 Total Score 6  PHQ-9 Total Score 14      [place] ED from 08/12/2022 in [facility] Most recent reading at 08/12/2022  5:37 PM ED from 08/12/2022 in [place] Most recent reading at 08/12/2022 10:00 AM Admission (Discharged) from 07/28/2022 in [place] [facility]B Most recent reading at 07/28/2022  6:00 PM  C-SSRS RISK CATEGORY No Risk High Risk No Risk        Musculoskeletal  Strength & Muscle Tone: {desc; muscle tone:32375} Gait & Station: {PE GAIT ED [id number] Patient leans: {Patient Leans:[id number]}  Psychiatric Specialty Exam  Presentation  General Appearance:   Appropriate for Environment  Eye Contact: Fair  Speech: Clear and Coherent; Normal Rate  Speech Volume: Normal  Handedness: Right   Mood and Affect  Mood: Depressed  Affect: Appropriate   Thought Process  Thought Processes: Coherent  Descriptions of Associations:Intact  Orientation:Full (Time, Place and Person)  Thought Content:Logical  Diagnosis of Schizophrenia or Schizoaffective disorder in past: No    Hallucinations:Hallucinations: None  Ideas of Reference:None  Suicidal Thoughts:Suicidal Thoughts: Yes, Active SI Active Intent and/or Plan: Without Intent; Without Plan (Contracts for safety while on the unit)  Homicidal Thoughts:Homicidal Thoughts: No   Sensorium  Memory: Immediate Fair; Recent Fair  Judgment: Fair  Insight: Shallow   Executive Functions  Concentration: Fair  Attention Span: Fair  Recall: [place] of Knowledge: Fair  Language: Good   Psychomotor Activity  Psychomotor Activity: Psychomotor Activity: Decreased   Assets  Assets: Communication Skills; Desire for Improvement   Sleep  Sleep: Sleep: Good   No data recorded  Physical Exam  Physical Exam ROS Blood pressure 110/71, pulse 76, temperature 98.3 F (36.8 C), temperature source Oral, resp. rate 18, SpO2 100 %. There is no height or weight on file to calculate BMI.  Treatment Plan Summary: {CHL [facility] MD [place]. [id number]  Prentice Docker, Medical Student 08/17/2022 2:08 PM

## 2022-08-17 NOTE — Progress Notes (Signed)
Joseph [name] refused to get up to for medication administration , stated No I am good."

## 2022-08-17 NOTE — Progress Notes (Signed)
Joseph [name] refused to attend the morning meeting, he was reminded od the treatment agreement he sogned when he was admitted to [facility]. He got up to eat and returned to his room.

## 2022-08-17 NOTE — ED Notes (Signed)
Patient refused lunch after asking multiple times.

## 2022-08-17 NOTE — ED Notes (Signed)
Patients CIWA was discontinued @ 1346.

## 2022-08-17 NOTE — ED Notes (Signed)
Patient multivitamin would not scan multiple sclerosis. Joseph [name]  Rnverified that it was a multivitamin

## 2022-08-17 NOTE — Progress Notes (Signed)
Joseph [name] got up ate lunch, cooperative with his medications and allowed this writer to draw his blood.

## 2022-08-17 NOTE — ED Notes (Signed)
Patient a&o x4. Denies SI/HI/AVH,Denies withdrawal sx now..Denies intent or plan  to harm self or others. Routine conducted according to faculty protocol .Encourge patient to notify  staff with any needs or concerns. Patient verbalized agreement and understanding.Will continue to monitor for safety. 

## 2022-08-18 DIAGNOSIS — F1014 Alcohol abuse with alcohol-induced mood disorder: Secondary | ICD-10-CM | POA: Diagnosis not present

## 2022-08-18 DIAGNOSIS — F333 Major depressive disorder, recurrent, severe with psychotic symptoms: Secondary | ICD-10-CM | POA: Diagnosis not present

## 2022-08-18 DIAGNOSIS — F17210 Nicotine dependence, cigarettes, uncomplicated: Secondary | ICD-10-CM | POA: Diagnosis not present

## 2022-08-18 DIAGNOSIS — F4310 Post-traumatic stress disorder, unspecified: Secondary | ICD-10-CM | POA: Diagnosis not present

## 2022-08-18 NOTE — ED Notes (Signed)
Pt is in the bed sleeping. Respirations are even and unlabored. No acute distress noted. Will continue to monitor for safety. 

## 2022-08-18 NOTE — ED Notes (Signed)
Patient has remained in bed all morning.  This despite writers attempts to get him up.  Patient is malodorous and linens require changing.  RN offered to assist patient however he did not respond verbally and only grunted.  Patient is difficult to rouse from sleep.  He appears apathetic and is not very motivated to participate in self care activities.  He was encouraged to attend NA zoom meeting however he did not get up for that either.  He does not express interest in recovery or in learning about ways not to going back to substance use.  He has been called for lunch by [facility] however, again no interest has been shown.  Will monitor and provide safety on unit.  Will continue to encourage him out of bed.

## 2022-08-18 NOTE — Discharge Planning (Signed)
LCSW attempted to contact his Engineer, drilling Mrs. [place] [phone], however received no answer. Phone goes straight to voicemail box. Voice message left at 10:45am requesting phone call back.   Fernande Boyden, LCSW Clinical Social Worker [facility] Ph: [phone]

## 2022-08-18 NOTE — ED Notes (Signed)
Pt is in the dayroom watching TV.  Respirations are even and unlabored. No acute distress noted. Will continue to monitor for safety. 

## 2022-08-18 NOTE — ED Provider Notes (Signed)
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 copies this assay can detect is 138 copies/mL. A negative result  does not preclude SARS-Cov-2 infection and should not be used as the sole basis for treatment or other patient management decisions. A negative result may occur with  improper specimen collection/handling, submission of specimen other than nasopharyngeal swab, presence of viral mutation(s) within the areas targeted by this assay, and inadequate number of viral copies(<138  copies/mL). A negative result must be combined with clinical observations, patient history, and epidemiological information. The expected result is Negative.  Fact Sheet for Patients:  BloggerCourse.com  Fact Sheet for Healthcare Providers:  SeriousBroker.it  This test is no                          t yet approved or cleared by the [place] FDA and  has been authorized for detection and/or diagnosis of SARS-CoV-2 by FDA under an Emergency Use Authorization (EUA). This EUA will remain  in effect (meaning this test can be used) for the duration of the COVID-19 declaration under Section 564(b)(1) of the Act, 21 U.S.C.section 360bbb-3(b)(1), unless the authorization is terminated  or revoked sooner.       Influenza A by PCR 07/27/2022 NEGATIVE  NEGATIVE Final   Influenza B by PCR 07/27/2022 NEGATIVE  NEGATIVE Final   Comment: (NOTE) The Xpert Xpress SARS-CoV-2/FLU/RSV plus assay is intended as an aid in the diagnosis of influenza from Nasopharyngeal swab specimens and should not be used as a sole basis for treatment. Nasal washings and aspirates are unacceptable for Xpert Xpress SARS-CoV-2/FLU/RSV testing.  Fact Sheet for Patients: BloggerCourse.com  Fact Sheet for Healthcare Providers: SeriousBroker.it  This test is not yet approved or cleared by the [place] FDA and has been authorized for detection and/or diagnosis of SARS-CoV-2 by FDA under an Emergency Use Authorization (EUA). This EUA will remain in effect (meaning this test can be used) for the duration of the COVID-19 declaration under Section 564(b)(1) of the Act, 21 U.S.C. section 360bbb-3(b)(1), unless the authorization is terminated or revoked.  Performed at [facility], [address], [place], [place] [address]   Admission on 07/12/2022, Discharged on 07/16/2022  Component Date Value  Ref Range Status   Lactic Acid, Venous 07/12/2022 1.0  0.5 - 1.9 mmol/L Final   Performed at [facility], [address], [place], [place] [address]   Sodium 07/12/2022 139  135 - 145 mmol/L Final   Potassium 07/12/2022 3.8  3.5 - 5.1 mmol/L Final   Chloride 07/12/2022 104  98 - 111 mmol/L Final   CO2 07/12/2022 22  22 - 32 mmol/L Final   Glucose, Bld 07/12/2022 90  70 - 99 mg/dL Final   Glucose reference range applies only to samples taken after fasting for at least 8 hours.   BUN 07/12/2022 14  6 - 20 mg/dL Final   Creatinine, Ser 07/12/2022 0.92  0.61 - 1.24 mg/dL Final   Calcium 09/81/1914 9.3  8.9 - 10.3 mg/dL Final   Total Protein 78/29/5621 7.9  6.5 - 8.1 g/dL Final   Albumin 30/86/5784 2.9 (L)  3.5 - 5.0 g/dL Final   AST 69/62/9528 24  15 - 41 U/L Final   ALT 07/12/2022 26  0 - 44 U/L Final   Alkaline Phosphatase 07/12/2022 121  38 - 126 U/L Final   Total Bilirubin 07/12/2022 0.6  0.3 - 1.2 mg/dL Final   GFR, Estimated 07/12/2022 >60  >60 mL/min Final   Comment: (NOTE) Calculated using the CKD-EPI Creatinine Equation (2021)  Anion gap 07/12/2022 13  5 - 15 Final   Performed at [facility], [address] [place], [place] [address]   WBC 07/12/2022 9.7  4.0 - 10.5 K/uL Final   RBC 07/12/2022 3.74 (L)  4.22 - 5.81 MIL/uL Final   Hemoglobin 07/12/2022 11.0 (L)  13.0 - 17.0 g/dL Final   HCT 19/14/7829 33.9 (L)  39.0 - 52.0 % Final   MCV 07/12/2022 90.6  80.0 - 100.0 fL Final   MCH 07/12/2022 29.4  26.0 - 34.0 pg Final   MCHC 07/12/2022 32.4  30.0 - 36.0 g/dL Final   RDW 56/21/3086 14.7  11.5 - 15.5 % Final   Platelets 07/12/2022 879 (H)  150 - 400 K/uL Final   nRBC 07/12/2022 0.0  0.0 - 0.2 % Final   Neutrophils Relative % 07/12/2022 65  % Final   Neutro Abs 07/12/2022 6.3  1.7 - 7.7 K/uL Final   Lymphocytes Relative 07/12/2022 22  % Final   Lymphs Abs 07/12/2022 2.1  0.7 - 4.0 K/uL Final   Monocytes Relative 07/12/2022 10  % Final   Monocytes Absolute  07/12/2022 1.0  0.1 - 1.0 K/uL Final   Eosinophils Relative 07/12/2022 1  % Final   Eosinophils Absolute 07/12/2022 0.1  0.0 - 0.5 K/uL Final   Basophils Relative 07/12/2022 2  % Final   Basophils Absolute 07/12/2022 0.2 (H)  0.0 - 0.1 K/uL Final   Immature Granulocytes 07/12/2022 0  % Final   Abs Immature Granulocytes 07/12/2022 0.04  0.00 - 0.07 K/uL Final   Performed at [facility], [address], [place], [place] [address]   Color, Urine 07/12/2022 YELLOW  YELLOW Final   APPearance 07/12/2022 CLEAR  CLEAR Final   Specific Gravity, Urine 07/12/2022 >1.030 (H)  1.005 - 1.030 Final   pH 07/12/2022 6.0  5.0 - 8.0 Final   Glucose, UA 07/12/2022 NEGATIVE  NEGATIVE mg/dL Final   Hgb urine dipstick 07/12/2022 NEGATIVE  NEGATIVE Final   Bilirubin Urine 07/12/2022 NEGATIVE  NEGATIVE Final   Ketones, ur 07/12/2022 15 (A)  NEGATIVE mg/dL Final   Protein, ur 96/29/5284 NEGATIVE  NEGATIVE mg/dL Final   Nitrite 13/24/4010 NEGATIVE  NEGATIVE Final   Leukocytes,Ua 07/12/2022 NEGATIVE  NEGATIVE Final   Comment: Microscopic not done on urines with negative protein, blood, leukocytes, nitrite, or glucose < 500 mg/dL. Performed at [facility], [address] [place], [place] [address]    HIV Screen 4th Generation wRfx 07/16/2022 Non Reactive  Non Reactive Final   Performed at [facility], [address], [place], [place] [address]   WBC 07/16/2022 10.5  4.0 - 10.5 K/uL Final   RBC 07/16/2022 4.15 (L)  4.22 - 5.81 MIL/uL Final   Hemoglobin 07/16/2022 12.1 (L)  13.0 - 17.0 g/dL Final   HCT 34/74/2595 36.5 (L)  39.0 - 52.0 % Final   MCV 07/16/2022 88.0  80.0 - 100.0 fL Final   MCH 07/16/2022 29.2  26.0 - 34.0 pg Final   MCHC 07/16/2022 33.2  30.0 - 36.0 g/dL Final   RDW 63/87/5643 14.2  11.5 - 15.5 % Final   Platelets 07/16/2022 604 (H)  150 - 400 K/uL Final   nRBC 07/16/2022 0.0  0.0 - 0.2 % Final   Performed at [facility], [address] [place], [place] [address]    Sodium 07/16/2022 136  135 - 145 mmol/L Final   Potassium 07/16/2022 4.2  3.5 -  5.1 mmol/L Final   Chloride 07/16/2022 104  98 - 111 mmol/L Final   CO2 07/16/2022 24  22 - 32 mmol/L Final   Glucose, Bld 07/16/2022 117 (H)  70 - 99 mg/dL Final   Glucose reference range applies only to samples taken after fasting for at least 8 hours.   BUN 07/16/2022 10  6 - 20 mg/dL Final   Creatinine, Ser 07/16/2022 0.88  0.61 - 1.24 mg/dL Final   Calcium 16/06/9603 9.4  8.9 - 10.3 mg/dL Final   GFR, Estimated 07/16/2022 >60  >60 mL/min Final   Comment: (NOTE) Calculated using the CKD-EPI Creatinine Equation (2021)    Anion gap 07/16/2022 8  5 - 15 Final   Performed at [facility], [address] [place], [place] [address]   Blood Alcohol level:  Lab Results  Component Value Date   ETH 26 (H) 08/12/2022   ETH <10 07/27/2022   Metabolic Disorder Labs: Lab Results  Component Value Date   HGBA1C 4.6 (L) 07/30/2022   MPG 85.32 07/30/2022   No results found for: "PROLACTIN" Lab Results  Component Value Date   CHOL 148 07/30/2022   TRIG 133 07/30/2022   HDL 60 07/30/2022   CHOLHDL 2.5 07/30/2022   VLDL 27 07/30/2022   LDLCALC 61 07/30/2022   Therapeutic Lab Levels: No results found for: "LITHIUM" No results found for: "VALPROATE" No results found for: "CBMZ" Physical Findings   AUDIT    Flowsheet Row [facility]  Alcohol Use Disorder Identification Test Final Score (AUDIT) 19      CAGE-AID    Flowsheet Row ED to Hosp-Admission (Discharged) from 07/12/2022 in [facility]  SURGICAL  CAGE-AID Score 0      PHQ2-9    Flowsheet Row ED from 08/12/2022 in [facility]  PHQ-2 Total Score 6  PHQ-9 Total Score 14      Flowsheet Row ED from 08/12/2022 in [facility] Most recent reading at 08/12/2022  5:37 PM ED from  08/12/2022 in [facility] EMERGENCY DEPARTMENT Most recent reading at 08/12/2022 10:00 AM Admission (Discharged) [facility] Most recent reading at 07/28/2022  6:00 PM  C-SSRS RISK CATEGORY No Risk High Risk No Risk       Musculoskeletal  Strength & Muscle Tone: within normal limits Gait & Station: unable to assess, patient laying down Patient leans: N/A   Psychiatric Specialty Exam   Presentation  General Appearance:Appropriate for Environment Eye Contact:Fair Speech:Clear and Coherent, Normal Rate Volume:Normal Handedness:Right  Mood and Affect  Mood:Euthymic Affect:Appropriate, Congruent  Thought Process  Thought Process:Coherent, Goal Directed Descriptions of Associations:Intact  Thought Content Suicidal Thoughts:Suicidal Thoughts: No Homicidal Thoughts:Homicidal Thoughts: No Hallucinations:Hallucinations: None Ideas of Reference:None Thought Content:Logical  Sensorium  Memory:Immediate Good Judgment:Good Insight:Fair  Executive Functions  Orientation:Full (Time, Place and Person) Language:Good Concentration:Good Attention:Good Recall:Good Fund of Knowledge:Good  Psychomotor Activity  Psychomotor Activity:Psychomotor Activity: Normal  Assets  Assets:Communication Skills, Desire for Improvement  Sleep  Quality:Good  Physical Exam  BP 106/67 (BP Location: Left Arm)   Pulse 63   Temp 98.4 F (36.9 C) (Oral)   Resp 16   SpO2 100%  Physical Exam Vitals and nursing note reviewed.  Constitutional:      General: He is not in acute distress.    Appearance: He is not ill-appearing, toxic-appearing or diaphoretic.  HENT:     Head: Normocephalic.  Pulmonary:     Effort: Pulmonary effort is normal. No respiratory distress.  Neurological:     Mental Status: He is alert.     Assessment / Plan  Total Time spent with patient: 30 minutes Treatment Plan Summary: Daily contact with  patient to assess and evaluate symptoms and progress in treatment and Medication management  Principal Problem:   Alcohol abuse with alcohol-induced mood disorder (HCC)   Joseph [name] is a 26 y.o. male with PMH PTSD, tobacco use d/o, alcohol use d/o (no h/o sz or DT), cannabis use d/o, cocaine use d/o, methamphetamine use d/o, substance induced psychosis, suicide attempt w past IP psych admission (last time 07/2022 for), housing instability, lengthy legal history, who presented [facility]untary to [facility] (08/13/2022) from [facility] for concerns of anxiety and passive SI, then admitted to [facility] for EtOH, cocaine, meth detox and residential placement.  Patient has remained motivated to pursue treatment, and will likely have a bed at day [facility] on Thursday, 12/7.  Total duration of encounter: 6 days  AUD, severe Action stage. Daily drink about 16oz x6 beers, with last drink was 11/28. Denied h/o seizures and DT.  BAL 26, AST/ALT wnl.  CIWA 0 greater than 48 hours CIWA  with librium taper completed.   Patient declined naltrexone.  Stimulant use d/o  Cannabis use d/o Action stage. UDS + . Denied ever IVDU Continued comfort PRNs Encouraged cessation  Follow-up UDS-not collected yet  H/o pulmonary embolism Hospitalized on 10/29, started on Xarelto, with goal to continue it for at least 3 months.  However patient has been nonadherent.  Plan to restart Xarelto per below.  Confirmed with day Dortha Kern, that patient is able to go to day [facility] while on Xarelto. Continue home Xarelto 20 mg with dinner x 3 months (end 11/10/22)  MDD  PTSD H/o substance-induced mood disorder and substance-induced psychosis.  As patient is currently not experiencing psychosis or mania, will hold antipsychotic that was started at [facility] last month due to substance-induced psychosis. Continue home lexapro 20 mg daily  Tobacco use d/o Contemplative stage.  NRTs Encouraged cessation    Considerations for follow-up: Recommend  high risk screening every 6-12 months with HIV, RPR, hepatitis panel Recommend monitoring LFT every 6-12 months while on naltrexone PCP to follow-up on Xarelto for PE Outpatient psychiatry to follow for mood and Lexapro  DISPO: Tentative discharge date: 12/7 Location: [facility]pending acceptance Is amenable to residential rehab. Will attempt to dc patient door-to-door, however if unable will discuss with [facility] as bridge to rehab.    Signed: Lamar Sprinkles, MD Psychiatry Resident, PGY-2 08/18/2022, 8:18 AM   [facility] [address] [place], [place] [address] Dept: [phone] Dept Fax: [phone]

## 2022-08-19 DIAGNOSIS — F333 Major depressive disorder, recurrent, severe with psychotic symptoms: Secondary | ICD-10-CM | POA: Diagnosis not present

## 2022-08-19 DIAGNOSIS — F4310 Post-traumatic stress disorder, unspecified: Secondary | ICD-10-CM | POA: Diagnosis not present

## 2022-08-19 DIAGNOSIS — F1014 Alcohol abuse with alcohol-induced mood disorder: Secondary | ICD-10-CM | POA: Diagnosis not present

## 2022-08-19 DIAGNOSIS — F17210 Nicotine dependence, cigarettes, uncomplicated: Secondary | ICD-10-CM | POA: Diagnosis not present

## 2022-08-19 NOTE — ED Notes (Signed)
Pt sleeping in no acute distress. RR even and unlabored. Environment secured. Will continue to monitor for safety. 

## 2022-08-19 NOTE — Progress Notes (Signed)
LCSW attempted to contact [facility] on this morning to inquire about patient admitting into their facility on tomorrow, however received no answer. LCSW was unable to leave a voice message as the phone continued to ring. LCSW will continue to follow up for updates.   Fernande Boyden, LCSW Clinical Social Worker [facility] Ph: [phone]

## 2022-08-19 NOTE — ED Notes (Signed)
Pt sitting in dining room watching TV. A&O x4, calm and cooperative. Denies current SI/HI/AVH. No signs of distress noted. Monitoring for safety.  

## 2022-08-19 NOTE — ED Notes (Signed)
Patient A&Ox4. Denies intent to harm self/others when asked. Denies A/VH. Patient denies any physical complaints when asked. Support and encouragement provided. Routine safety checks conducted according to facility protocol. Encouraged patient to notify staff if thoughts of harm toward self or others arise. Patient verbalize understanding and agreement. Will continue to monitor for safety.

## 2022-08-19 NOTE — ED Provider Notes (Signed)
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 patient management decisions.  A negative result may occur with  improper specimen collection/handling, submission of specimen other than nasopharyngeal swab, presence of viral mutation(s) within the areas targeted by this assay, and inadequate number of viral copies(<138 copies/mL). A negative result must be combined with clinical observations, patient history, and  epidemiological information. The expected result is Negative.  Fact Sheet for Patients:  EntrepreneurPulse.com.au  Fact Sheet for Healthcare Providers:  IncredibleEmployment.be  This test is no                          t yet approved or cleared by the [place] FDA and  has been authorized for detection and/or diagnosis of SARS-CoV-2 by FDA under an Emergency Use Authorization (EUA). This EUA will remain  in effect (meaning this test can be used) for the duration of the COVID-19 declaration under Section 564(b)(1) of the Act, 21 U.S.C.section 360bbb-3(b)(1), unless the authorization is terminated  or revoked sooner.       Influenza A by PCR 07/27/2022 NEGATIVE  NEGATIVE Final   Influenza B by PCR 07/27/2022 NEGATIVE  NEGATIVE Final   Comment: (NOTE) The Xpert Xpress SARS-CoV-2/FLU/RSV plus assay is intended as an aid in the diagnosis of influenza from Nasopharyngeal swab specimens and should not be used as a sole basis for treatment. Nasal washings and aspirates are unacceptable for Xpert Xpress SARS-CoV-2/FLU/RSV testing.  Fact Sheet for Patients: EntrepreneurPulse.com.au  Fact Sheet for Healthcare Providers: IncredibleEmployment.be  This test is not yet approved or cleared by the [place] FDA and has been authorized for detection and/or diagnosis of SARS-CoV-2 by FDA under an Emergency Use Authorization (EUA). This EUA will remain in effect (meaning this test can be used) for the duration of the COVID-19 declaration under Section 564(b)(1) of the Act, 21 U.S.C. section 360bbb-3(b)(1), unless the authorization is terminated or revoked.  Performed at [facility], [place] [address], [place], [place] [address]   Admission on 07/12/2022, Discharged on 07/16/2022  Component Date Value Ref Range Status   Lactic Acid, Venous 07/12/2022 1.0  0.5 - 1.9 mmol/L Final   Performed at  [facility], [place] [address] [place], [place] [address]   Sodium 07/12/2022 139  135 - 145 mmol/L Final   Potassium 07/12/2022 3.8  3.5 - 5.1 mmol/L Final   Chloride 07/12/2022 104  98 - 111 mmol/L Final   CO2 07/12/2022 22  22 - 32 mmol/L Final   Glucose, Bld 07/12/2022 90  70 - 99 mg/dL Final   Glucose reference range applies only to samples taken after fasting for at least 8 hours.   BUN 07/12/2022 14  6 - 20 mg/dL Final   Creatinine, Ser 07/12/2022 0.92  0.61 - 1.24 mg/dL Final   Calcium 07/12/2022 9.3  8.9 - 10.3 mg/dL Final   Total Protein 07/12/2022 7.9  6.5 - 8.1 g/dL Final   Albumin 07/12/2022 2.9 (L)  3.5 - 5.0 g/dL Final   AST 07/12/2022 24  15 - 41 U/L Final   ALT 07/12/2022 26  0 - 44 U/L Final   Alkaline Phosphatase 07/12/2022 121  38 - 126 U/L Final   Total Bilirubin 07/12/2022 0.6  0.3 - 1.2 mg/dL Final   GFR, Estimated 07/12/2022 >60  >60 mL/min Final   Comment: (NOTE) Calculated using the CKD-EPI Creatinine Equation (2021)    Anion gap 07/12/2022 13  5 - 15 Final   Performed at [place] [address],  [place], [place] [address]   WBC 07/12/2022 9.7  4.0 - 10.5 K/uL Final   RBC 07/12/2022 3.74 (L)  4.22 - 5.81 MIL/uL Final   Hemoglobin 07/12/2022 11.0 (L)  13.0 - 17.0 g/dL Final   HCT 07/12/2022 33.9 (L)  39.0 - 52.0 % Final   MCV 07/12/2022 90.6  80.0 - 100.0 fL Final   MCH 07/12/2022 29.4  26.0 - 34.0 pg Final   MCHC 07/12/2022 32.4  30.0 - 36.0 g/dL Final   RDW 07/12/2022 14.7  11.5 - 15.5 % Final   Platelets 07/12/2022 879 (H)  150 - 400 K/uL Final   nRBC 07/12/2022 0.0  0.0 - 0.2 % Final   Neutrophils Relative % 07/12/2022 65  % Final   Neutro Abs 07/12/2022 6.3  1.7 - 7.7 K/uL Final   Lymphocytes Relative 07/12/2022 22  % Final   Lymphs Abs 07/12/2022 2.1  0.7 - 4.0 K/uL Final   Monocytes Relative 07/12/2022 10  % Final   Monocytes Absolute 07/12/2022 1.0  0.1 - 1.0 K/uL Final   Eosinophils Relative 07/12/2022 1  % Final   Eosinophils  Absolute 07/12/2022 0.1  0.0 - 0.5 K/uL Final   Basophils Relative 07/12/2022 2  % Final   Basophils Absolute 07/12/2022 0.2 (H)  0.0 - 0.1 K/uL Final   Immature Granulocytes 07/12/2022 0  % Final   Abs Immature Granulocytes 07/12/2022 0.04  0.00 - 0.07 K/uL Final   Performed at [facility], [place] [address], [place], [place] [address]   Color, Urine 07/12/2022 YELLOW  YELLOW Final   APPearance 07/12/2022 CLEAR  CLEAR Final   Specific Gravity, Urine 07/12/2022 >1.030 (H)  1.005 - 1.030 Final   pH 07/12/2022 6.0  5.0 - 8.0 Final   Glucose, UA 07/12/2022 NEGATIVE  NEGATIVE mg/dL Final   Hgb urine dipstick 07/12/2022 NEGATIVE  NEGATIVE Final   Bilirubin Urine 07/12/2022 NEGATIVE  NEGATIVE Final   Ketones, ur 07/12/2022 15 (A)  NEGATIVE mg/dL Final   Protein, ur 07/12/2022 NEGATIVE  NEGATIVE mg/dL Final   Nitrite 07/12/2022 NEGATIVE  NEGATIVE Final   Leukocytes,Ua 07/12/2022 NEGATIVE  NEGATIVE Final   Comment: Microscopic not done on urines with negative protein, blood, leukocytes, nitrite, or glucose < 500 mg/dL. Performed at [facility], [place] [address] [place], [place] [address]    HIV Screen 4th Generation wRfx 07/16/2022 Non Reactive  Non Reactive Final   Performed at [facility], [place] [address] [place], [place] [address]   WBC 07/16/2022 10.5  4.0 - 10.5 K/uL Final   RBC 07/16/2022 4.15 (L)  4.22 - 5.81 MIL/uL Final   Hemoglobin 07/16/2022 12.1 (L)  13.0 - 17.0 g/dL Final   HCT 07/16/2022 36.5 (L)  39.0 - 52.0 % Final   MCV 07/16/2022 88.0  80.0 - 100.0 fL Final   MCH 07/16/2022 29.2  26.0 - 34.0 pg Final   MCHC 07/16/2022 33.2  30.0 - 36.0 g/dL Final   RDW 07/16/2022 14.2  11.5 - 15.5 % Final   Platelets 07/16/2022 604 (H)  150 - 400 K/uL Final   nRBC 07/16/2022 0.0  0.0 - 0.2 % Final   Performed at [place] [address], [place], [place] [address]   Sodium 07/16/2022 136  135 - 145 mmol/L Final   Potassium 07/16/2022 4.2  3.5 - 5.1 mmol/L Final    Chloride 07/16/2022 104  98 - 111 mmol/L Final   CO2 07/16/2022 24  22 -  32 mmol/L Final   Glucose, Bld 07/16/2022 117 (H)  70 - 99 mg/dL Final   Glucose reference range applies only to samples taken after fasting for at least 8 hours.   BUN 07/16/2022 10  6 - 20 mg/dL Final   Creatinine, Ser 07/16/2022 0.88  0.61 - 1.24 mg/dL Final   Calcium 07/16/2022 9.4  8.9 - 10.3 mg/dL Final   GFR, Estimated 07/16/2022 >60  >60 mL/min Final   Comment: (NOTE) Calculated using the CKD-EPI Creatinine Equation (2021)    Anion gap 07/16/2022 8  5 - 15 Final   Performed at [facility], [place] [address], [place], [place] [address]   Blood Alcohol level:  Lab Results  Component Value Date   ETH 26 (H) 08/12/2022   ETH <10 123XX123   Metabolic Disorder Labs: Lab Results  Component Value Date   HGBA1C 4.6 (L) 07/30/2022   MPG 85.32 07/30/2022   No results found for: "PROLACTIN" Lab Results  Component Value Date   CHOL 148 07/30/2022   TRIG 133 07/30/2022   HDL 60 07/30/2022   CHOLHDL 2.5 07/30/2022   VLDL 27 07/30/2022   LDLCALC 61 07/30/2022   Therapeutic Lab Levels: No results found for: "LITHIUM" No results found for: "VALPROATE" No results found for: "CBMZ" Physical Findings   AUDIT    Flowsheet Row Admission (Discharged) from 07/28/2022 in [facility]  Alcohol Use Disorder Identification Test Final Score (AUDIT) 19      CAGE-AID    Flowsheet Row ED to Hosp-Admission (Discharged) from 07/12/2022 in [facility] [place] Score 0      [place] ED from 08/12/2022 in [facility]  PHQ-2 Total Score 6  PHQ-9 Total Score 14      Flowsheet Row ED from 08/12/2022 in [facility] Most recent reading at 08/12/2022  5:37 PM ED from 08/12/2022 in [facility] Most recent reading at 08/12/2022  10:00 AM Admission (Discharged) from 07/28/2022 in [facility] Most recent reading at 07/28/2022  6:00 PM  C-SSRS RISK CATEGORY No Risk High Risk No Risk       Musculoskeletal  Strength & Muscle Tone: within normal limits Gait & Station: unable to assess, patient laying down Patient leans: N/A   Psychiatric Specialty Exam   Presentation  General Appearance:Appropriate for Environment Eye Contact:Fair Speech:Clear and Coherent, Normal Rate Volume:Normal Handedness:Right  Mood and Affect  Mood:Euthymic Affect:Appropriate, Congruent  Thought Process  Thought Process:Coherent, Goal Directed Descriptions of Associations:Intact  Thought Content Suicidal Thoughts:Suicidal Thoughts: No Homicidal Thoughts:Homicidal Thoughts: No Hallucinations:Hallucinations: None Ideas of Reference:None Thought Content:Logical  Sensorium  Memory:Immediate Good Judgment:Good Insight:Fair  Executive Functions  Orientation:Full (Time, Place and Person) Language:Good Concentration:Good [place] of Knowledge:Good  Psychomotor Activity  Psychomotor Activity:Psychomotor Activity: Normal  Assets  Assets:Communication Skills, Desire for Improvement  Sleep  Quality:Good  Physical Exam  BP (!) 113/98 (BP Location: Left Arm)   Pulse 63   Temp 98.2 F (36.8 C) (Oral)   Resp 18   SpO2 100%  Physical Exam Vitals and nursing note reviewed.  Constitutional:      General: He is not in acute distress.    Appearance: He is not ill-appearing, toxic-appearing or diaphoretic.  HENT:     Head: Normocephalic.  Pulmonary:     Effort: Pulmonary effort is normal. No respiratory distress.  Neurological:     Mental Status: He is alert.      Assessment / Plan  Total Time spent with patient: 30 minutes Treatment Plan Summary: Daily contact with patient to assess and evaluate symptoms and progress in treatment and Medication  management  Principal Problem:   Alcohol abuse with alcohol-induced mood disorder (HCC)   Joseph [name] is a 26 y.o. male with PMH PTSD, tobacco use d/o, alcohol use d/o (no h/o sz or DT), cannabis use d/o, cocaine use d/o, methamphetamine use d/o, substance induced psychosis, suicide attempt w past IP psych admission (last time 07/2022 for), housing instability, lengthy legal history, who presented [facility] to [facility] (08/13/2022) from [facility] for concerns of anxiety and passive SI, then admitted to [facility] for EtOH, cocaine, meth detox and residential placement.  Patient has remained motivated to pursue treatment, and will have a bed at [facility] on Thursday, 12/7.  Total duration of encounter: 7 days  AUD, severe Action stage. Daily drink about 16oz x6 beers, with last drink was 11/28. Denied h/o seizures and DT.  BAL 26, AST/ALT wnl.  CIWA 0 greater than 48 hours CIWA  with librium taper completed.   Patient declined naltrexone.  Stimulant use d/o  Cannabis use d/o Action stage. UDS + . Denied ever IVDU Continued comfort PRNs Encouraged cessation  Follow-up UDS-not collected yet  H/o pulmonary embolism Hospitalized on 10/29, started on Xarelto, with goal to continue it for at least 3 months.  However patient has been nonadherent.  Plan to restart Xarelto per below.  Confirmed with day [facility], that patient is able to go to day [facility] while on Xarelto. Continue home Xarelto 20 mg with dinner x 3 months (end 11/10/22)  MDD  PTSD H/o substance-induced mood disorder and substance-induced psychosis.  As patient is currently not experiencing psychosis or mania, will hold antipsychotic that was started at [facility] last month due to substance-induced psychosis. Continue home lexapro 20 mg daily  Tobacco use d/o Contemplative stage.  NRTs Encouraged cessation    Considerations for follow-up: Recommend high risk screening every 6-12 months with HIV, RPR, hepatitis panel Recommend  monitoring LFT every 6-12 months while on naltrexone PCP to follow-up on Xarelto for PE Outpatient psychiatry to follow for mood and Lexapro  DISPO: Discharge date: 12/7 Location: [facility] today; we will provide door-to-door transfer.   Signed: Rosezetta Schlatter, MD Psychiatry Resident, PGY-2 08/19/2022, 8:28 AM   [facility] [address] [place], [place] [address] Dept: [phone] Dept Fax: [phone]

## 2022-08-19 NOTE — ED Notes (Signed)
Pt is in the bed sleeping. Respirations are even and unlabored. No acute distress noted. Will continue to monitor for safety. 

## 2022-08-19 NOTE — [facility] Group Notes (Signed)
[facility] LCSW Group Therapy Note  Date/Time: 08/18/2022 at 1:30PM  Type of Therapy/Topic:  Group Therapy:  Balance in Life  Participation Level:  Active  Description of Group:   This group will address the importance of considering the journey and not just the destination. Patients will be encouraged to process areas in their lives where they found it hard to focus on the good rather than the bad, and identify reasons for maintaining such thought pattern. Facilitator will guide patients utilizing problem- solving interventions to address and improve the way each patient views themselves and their situation. Patients will work through understanding and applying humility when it comes to life changes and the interactions we have with others. Patients will be encouraged to explore ways that they will move forward throughout life's journey, and make healthier decisions for themselves.   Therapeutic Goals: Patient will identify two or more emotions or situations that they observed or felt throughout watching the video clip. Patient will identify signs where they may have responded to someone or situation due to their current circumstance.  Patient will identify two ways to set better habits in order to achieve more peace in their lives. Patient will demonstrate ability to communicate their needs through discussion and/or role plays.  Summary of Patient Progress: Patient actively participated in group on today. Patient reports that the video clip definitely made him reflect on the importance of being humble and considerate of other people. Patient reports feeling encouraged from watching the video, and his was able to provide feedback to staff and peers.   Therapeutic Modalities:   Cognitive Behavioral Therapy Solution-Focused Therapy Assertiveness Training  Fernande Boyden, [place] Clinical Social Worker [facility] Ph: [phone]

## 2022-08-19 NOTE — Discharge Planning (Signed)
Per RN Shon Baton, patient has been accepted to [facility] and can transfer to the facility on tomorrow by 9:00am. Update has been provided to the patient and MD made aware. Patient will need a 14-30 day supply of medication and one month refill. No nicotine gum allowed, however 14-30 day nicotine patches to be provided if needed. No other needs to report at this time.    LCSW will continue to follow up and provide updates as received.    Fernande Boyden, LCSW Clinical Social Worker [facility] Ph: [phone]

## 2022-08-19 NOTE — ED Notes (Signed)
Pt denies needs or concerns. Interacting well with peers. No acute distress noted. Safety maintained.

## 2022-08-20 DIAGNOSIS — F4310 Post-traumatic stress disorder, unspecified: Secondary | ICD-10-CM | POA: Diagnosis not present

## 2022-08-20 DIAGNOSIS — F1014 Alcohol abuse with alcohol-induced mood disorder: Secondary | ICD-10-CM | POA: Diagnosis not present

## 2022-08-20 DIAGNOSIS — F333 Major depressive disorder, recurrent, severe with psychotic symptoms: Secondary | ICD-10-CM | POA: Diagnosis not present

## 2022-08-20 DIAGNOSIS — F17210 Nicotine dependence, cigarettes, uncomplicated: Secondary | ICD-10-CM | POA: Diagnosis not present

## 2022-08-20 MED ORDER — TRAZODONE HCL 50 MG PO TABS: 50.0000 mg | ORAL_TABLET | Freq: Every evening | ORAL | 0 refills | Status: DC | PRN

## 2022-08-20 MED ORDER — NICOTINE 21 MG/24HR TD PT24: 21.0000 mg | MEDICATED_PATCH | Freq: Every day | TRANSDERMAL | 0 refills | Status: DC

## 2022-08-20 MED ORDER — NICOTINE 21 MG/24HR TD PT24
21.0000 mg | MEDICATED_PATCH | Freq: Every day | TRANSDERMAL | Status: DC
  Filled 2022-08-20 (×3): qty 14

## 2022-08-20 MED ORDER — RIVAROXABAN 20 MG PO TABS: 20.0000 mg | ORAL_TABLET | Freq: Every day | ORAL | 0 refills | Status: DC

## 2022-08-20 MED ORDER — HYDROXYZINE HCL 25 MG PO TABS: 25.0000 mg | ORAL_TABLET | Freq: Three times a day (TID) | ORAL | 0 refills | Status: DC | PRN

## 2022-08-20 MED ORDER — MELATONIN 5 MG PO TABS: 5.0000 mg | ORAL_TABLET | Freq: Every day | ORAL | 0 refills | Status: AC

## 2022-08-20 MED ORDER — MELATONIN 5 MG PO TABS: 5.0000 mg | ORAL_TABLET | Freq: Every day | ORAL | 0 refills | Status: DC

## 2022-08-20 MED ORDER — ESCITALOPRAM OXALATE 20 MG PO TABS: 20.0000 mg | ORAL_TABLET | Freq: Every day | ORAL | 0 refills | Status: DC

## 2022-08-20 NOTE — ED Notes (Signed)
Pt sleeping in no acute distress. RR even and unlabored. Environment secured. Will continue to monitor for safety. 

## 2022-08-20 NOTE — Discharge Instructions (Signed)
Follow-up recommendations:  Activity:  Normal, as tolerated Diet:  Per PCP recommendation  Patient is instructed prior to discharge to: Take all medications as prescribed by his mental healthcare provider. Report any adverse effects and/or reactions from the medicines to his outpatient provider promptly. Patient has been instructed & cautioned: To not engage in alcohol and or illegal drug use while on prescription medicines.  In the event of worsening symptoms, patient is instructed to call the crisis hotline at 988, 911 and or go to the nearest ED for appropriate evaluation and treatment of symptoms. To follow-up with his primary care provider for your other medical issues, concerns and or health care needs.   Naloxone (Narcan) can help reverse an overdose when given to the victim quickly.  [place] offers free naloxone kits and instructions/training on its use.  Add naloxone to your first aid kit and you can help save a life.   Pick up your free kit at the following locations.   [place]:  [facility], [address] [place] [address] ([phone]) [facility] [address] [place] [id number] ([phone]) [facility] [address]  [place] [address]  [place]: [facility] [address] [place] [address] ([phone]) [facility] [address] [place] [address] ([phone])  

## 2022-08-20 NOTE — ED Notes (Signed)
Pt asleep in bed. Respirations even and unlabored. Monitoring for safety. 

## 2022-08-20 NOTE — ED Notes (Signed)
Pt resting in bed. Respirations even and unlabored. Monitoring for safety. 

## 2022-08-20 NOTE — ED Notes (Signed)
Patient A&O x 4, ambulatory. Patient discharged in no acute distress. Patient denied SI/HI, A/VH upon discharge. Patient verbalized understanding of all discharge instructions explained by staff, to include follow up appointments, RX's and safety plan. Pt belongings returned to patient from locker #7 intact. Patient escorted to lobby via staff for transport to [employer] [employer] [facility] [employer] via [employer] taxi service. Safety maintained.

## 2022-08-20 NOTE — ED Provider Notes (Signed)
FBC/OBS ASAP Discharge Summary  Date and Time: 08/20/2022 7:09 AM  Name: Joseph [name]  MRN:  [id number]   Discharge Diagnoses:  Final diagnoses:  Alcohol abuse with alcohol-induced mood disorder (HCC)  Severe episode of recurrent major depressive disorder, with psychotic features (HCC)    Subjective: Joseph [name] is a 26 y.o. male, with PMH PTSD, tobacco use d/o, alcohol use d/o (no h/o sz or DT), cannabis use d/o, cocaine use d/o, methamphetamine use d/o, substance induced psychosis, suicide attempt w past IP psych admission (last time 2021), housing instability, who presented Voluntary to [facility] (08/19/2022) from [facility] for concerns of anxiety and passive SI, then admitted to [facility] for EtOH, cocaine, meth detox and residential placement.   Stay Summary: The patient was evaluated each day by a clinical provider to ascertain response to treatment. Improvement was noted by the patient's report of decreasing symptoms, improved sleep and appetite, affect, medication tolerance, behavior, and participation in unit programming.  Patient was asked each day to complete a self inventory noting mood, mental status, pain, new symptoms, anxiety and concerns.   Patient responded well to medication and being in a therapeutic and supportive environment. Positive and appropriate behavior was noted and the patient was motivated for recovery. The patient worked closely with the treatment team and case manager to develop a discharge plan with appropriate goals. Coping skills, problem solving as well as relaxation therapies were also part of the unit programming.   By the day of discharge patient was in much improved condition than upon admission.  Symptoms were reported as significantly decreased or resolved completely. The patient denied SI/HI and voiced no AVH. The patient was motivated to continue taking medication with a goal of continued improvement in mental health.    Total Time spent with patient: 20  minutes  Past Psychiatric History:  Previous Psych Diagnoses: MDD Prior inpatient psychiatric treatment: 3 years ago in [place], hearing voices, was also using meth.  07/2022 at [facility] for MDD and substance-induced psychosis Current/prior outpatient psychiatric treatment: reports he is currently being followed [facility] for substance use  Current psychiatrist: Denies  Psychiatric medication history: -reports was given haldol "made my neck stiff", seroquel   Psychiatric medication compliance history: did not take his psychiatric medications after last hospitalization Neuromodulation history: denies  Current therapist: Reports seeing [facility]en Bame with [facility] for therapy History of suicide attempts: Denies  History of homicide: denies  Past Medical History: No past medical history on file.  Past Surgical History:  Procedure Laterality Date   CYST REMOVAL HAND  06/22/2022   Procedure: Repair left hand laceration;  Surgeon: Victorino Sparrow, MD;  Location: [facility] OR;  Service: Vascular;;   HEMATOMA EVACUATION Left 06/22/2022   Procedure: HEMOTHORAX EVACUATION;  Surgeon: Victorino Sparrow, MD;  Location: [facility] OR;  Service: Vascular;  Laterality: Left;   LAPAROTOMY N/A 06/22/2022   Procedure: EXPLORATORY LAPAROTOMY PLACEMENT OF LEFT CHEST TUBE. REPAIR OF 7 CENTIMETER RIGHT SCALP LACERATION. REPAIR OF SCALP LACERATION  AT CROWN.;  Surgeon: Gaynelle Adu, MD;  Location: [facility] OR;  Service: General;  Laterality: N/A;   WOUND EXPLORATION Left 06/22/2022   Procedure: Left chest exploration and intercostal artery ligation;  Surgeon: Victorino Sparrow, MD;  Location: [facility] OR;  Service: Vascular;  Laterality: Left;   Family History: No family history on file. Family Psychiatric History: Denies Social History:  Social History   Substance and Sexual Activity  Alcohol Use Yes   Alcohol/week: 8.0 standard drinks of alcohol  Types: 8 Cans of beer per week     Social History   Substance and Sexual Activity   Drug Use Yes   Types: Benzodiazepines, Marijuana    Social History   Socioeconomic History   Marital status: Unknown    Spouse name: Not on file   Number of children: Not on file   Years of education: Not on file   Highest education level: Not on file  Occupational History   Not on file  Tobacco Use   Smoking status: Every Day    Packs/day: 0.50    Types: Cigarettes   Smokeless tobacco: Current  Vaping Use   Vaping Use: Never used  Substance and Sexual Activity   Alcohol use: Yes    Alcohol/week: 8.0 standard drinks of alcohol    Types: 8 Cans of beer per week   Drug use: Yes    Types: Benzodiazepines, Marijuana   Sexual activity: Yes  Other Topics Concern   Not on file  Social History Narrative   ** Merged History Encounter **       Social Determinants of Health   Financial Resource Strain: Not on file  Food Insecurity: No Food Insecurity (07/28/2022)   Hunger Vital Sign    Worried About Running Out of Food in the Last Year: Never true    Ran Out of Food in the Last Year: Never true  Transportation Needs: No Transportation Needs (07/28/2022)   PRAPARE - Administrator, Civil Service (Medical): No    Lack of Transportation (Non-Medical): No  Physical Activity: Not on file  Stress: Not on file  Social Connections: Not on file   SDOH:  SDOH Screenings   Food Insecurity: No Food Insecurity (07/28/2022)  Housing: Low Risk  (07/28/2022)  Transportation Needs: No Transportation Needs (07/28/2022)  Utilities: Not At Risk (07/28/2022)  Alcohol Screen: High Risk (07/28/2022)  Depression (PHQ2-9): High Risk (08/16/2022)  Tobacco Use: High Risk (08/12/2022)    Tobacco Cessation:  A prescription for an FDA-approved tobacco cessation medication provided at discharge  Current Medications:  Current Facility-Administered Medications  Medication Dose Route Frequency Provider Last Rate Last Admin   acetaminophen (TYLENOL) tablet 650 mg  650 mg Oral Q6H PRN  Lenard Lance, FNP       alum & mag hydroxide-simeth (MAALOX/MYLANTA) 200-200-20 MG/5ML suspension 30 mL  30 mL Oral Q4H PRN Lenard Lance, FNP       escitalopram (LEXAPRO) tablet 20 mg  20 mg Oral Daily Princess Bruins, DO   20 mg at 08/19/22 0940   folic acid (FOLVITE) tablet 1 mg  1 mg Oral Daily Princess Bruins, DO   1 mg at 08/19/22 0940   hydrOXYzine (ATARAX) tablet 25 mg  25 mg Oral TID PRN Lenard Lance, FNP   25 mg at 08/20/22 0116   magnesium hydroxide (MILK OF MAGNESIA) suspension 30 mL  30 mL Oral Daily PRN Lenard Lance, FNP       melatonin tablet 5 mg  5 mg Oral QHS [name], NP   5 mg at 08/19/22 2125   multivitamin with minerals tablet 1 tablet  1 tablet Oral Daily Princess Bruins, DO   1 tablet at 08/19/22 0940   rivaroxaban (XARELTO) tablet 20 mg  20 mg Oral Q supper Princess Bruins, DO   20 mg at 08/19/22 1702   thiamine (VITAMIN B1) tablet 100 mg  100 mg Oral Daily Princess Bruins, DO   100 mg at 08/19/22 0940  traZODone (DESYREL) tablet 50 mg  50 mg Oral QHS PRN Lenard Lance, FNP   50 mg at 08/20/22 0116   Current Outpatient Medications  Medication Sig Dispense Refill   escitalopram (LEXAPRO) 20 MG tablet Take 1 tablet (20 mg total) by mouth daily. 30 tablet 0   hydrOXYzine (ATARAX) 25 MG tablet Take 1 tablet (25 mg total) by mouth 3 (three) times daily as needed for anxiety. 30 tablet 0   melatonin 5 MG TABS Take 1 tablet (5 mg total) by mouth at bedtime. 30 tablet 0   nicotine (NICODERM CQ - DOSED IN MG/24 HOURS) 21 mg/24hr patch Place 1 patch (21 mg total) onto the skin daily. (Patient not taking: Reported on 08/13/2022) 28 patch 0   rivaroxaban (XARELTO) 20 MG TABS tablet Take 1 tablet (20 mg total) by mouth daily with supper. 30 tablet 0   traZODone (DESYREL) 50 MG tablet Take 1 tablet (50 mg total) by mouth at bedtime as needed for sleep. 30 tablet 0    PTA Medications: (Not in a hospital admission)      08/16/2022    1:06 PM 08/13/2022    9:15 AM  Depression  screen PHQ 2/9  Decreased Interest 3 1  Down, Depressed, Hopeless 3 2  PHQ - 2 Score 6 3  Altered sleeping 3   Tired, decreased energy 3   Change in appetite 0 0  Feeling bad or failure about yourself  0 2  Trouble concentrating 2 0  Moving slowly or fidgety/restless 0 2  Suicidal thoughts 0 2  PHQ-9 Score 14   Difficult doing work/chores  Somewhat difficult    Flowsheet Row ED from 08/12/2022 in [facility] Most recent reading at 08/12/2022  5:37 PM ED from 08/12/2022 in [facility] EMERGENCY DEPARTMENT Most recent reading at 08/12/2022 10:00 AM [facility] (Discharged[facility] Most recent reading at 07/28/2022  6:00 PM  C-SSRS RISK CATEGORY No Risk High Risk No Risk       Musculoskeletal  Strength & Muscle Tone: within normal limits Gait & Station: normal Patient leans: N/A  Psychiatric Specialty Exam  Presentation  General Appearance:  Appropriate for Environment  Eye Contact: Good  Speech: Clear and Coherent; Normal Rate  Speech Volume: Normal  Handedness: Right   Mood and Affect  Mood: Euthymic  Affect: Appropriate; Congruent   Thought Process  Thought Processes: Coherent  Descriptions of Associations:Intact  Orientation:Full (Time, Place and Person)  Thought Content:Logical  Diagnosis of Schizophrenia or Schizoaffective disorder in past: No    Hallucinations:Hallucinations: None  Ideas of Reference:None  Suicidal Thoughts:Suicidal Thoughts: No  Homicidal Thoughts:Homicidal Thoughts: No   Sensorium  Memory: Immediate Good; Recent Good  Judgment: Fair  Insight: Fair; Shallow   Executive Functions  Concentration: Good  Attention Span: Good  Recall: Fair  Fund of Knowledge: Fair  Language: Good   Psychomotor Activity  Psychomotor Activity:Psychomotor Activity: Normal   Assets  Assets: Communication Skills;  Desire for Improvement; Social Support   Sleep  Sleep:Sleep: Good   No data recorded  Physical Exam  Vitals and nursing note reviewed.  Constitutional:      General: He is not in acute distress.    Appearance: He is not ill-appearing, toxic-appearing or diaphoretic.  HENT:     Head: Normocephalic.  Pulmonary:     Effort: Pulmonary effort is normal. No respiratory distress.  Neurological:     Mental Status: He  is alert.  Review of Systems  All other systems reviewed and are negative.  Blood pressure 118/68, pulse 78, temperature 97.9 F (36.6 C), temperature source Tympanic, resp. rate 18, SpO2 98 %. There is no height or weight on file to calculate BMI.  Demographic Factors:  Male, Low socioeconomic status, and Unemployed  Loss Factors: Decrease in vocational status, Decline in physical health, Legal issues, and Financial problems/change in socioeconomic status  Historical Factors: NA  Risk Reduction Factors:   Positive therapeutic relationship  Continued Clinical Symptoms:  Alcohol/Substance Abuse/Dependencies  Cognitive Features That Contribute To Risk:  None    Suicide Risk:  Mild:  There are no identifiable plans, no associated intent, mild dysphoria and related symptoms, good self-control (both objective and subjective assessment), few other risk factors, and identifiable protective factors, including available and accessible social support.  Plan Of Care/Follow-up recommendations:  Follow-up recommendations:  Activity:  Normal, as tolerated Diet:  Per PCP recommendation  Patient is instructed prior to discharge to: Take all medications as prescribed by his mental healthcare provider. Report any adverse effects and/or reactions from the medicines to his outpatient provider promptly. Patient has been instructed & cautioned: To not engage in alcohol and or illegal drug use while on prescription medicines.  In the event of worsening symptoms, patient is  instructed to call the crisis hotline at 988, 911 and or go to the nearest ED for appropriate evaluation and treatment of symptoms. To follow-up with his primary care provider for your other medical issues, concerns and or health care needs.   Disposition: [facility]   Taren Toops, MD 08/20/2022, 7:09 AM

## 2022-08-20 NOTE — ED Notes (Signed)
Patient A&Ox4. Denies intent to harm self/others when asked. Denies A/VH. Patient denies any physical complaints when asked. Pt voice enthusiasm over being transported to [facility] this morning. Support and encouragement provided. Routine safety checks conducted according to facility protocol. Encouraged patient to notify staff if thoughts of harm toward self or others arise. Patient verbalize understanding and agreement. Will continue to monitor for safety.

## 2022-08-24 ENCOUNTER — Ambulatory Visit: Payer: Self-pay | Admitting: Physician Assistant

## 2022-08-24 ENCOUNTER — Encounter: Payer: Self-pay | Admitting: Physician Assistant

## 2022-08-24 VITALS — BP 130/72 | HR 76 | Ht 71.0 in | Wt 198.0 lb

## 2022-08-24 DIAGNOSIS — F17210 Nicotine dependence, cigarettes, uncomplicated: Secondary | ICD-10-CM

## 2022-08-24 DIAGNOSIS — F1410 Cocaine abuse, uncomplicated: Secondary | ICD-10-CM

## 2022-08-24 DIAGNOSIS — F1510 Other stimulant abuse, uncomplicated: Secondary | ICD-10-CM | POA: Insufficient documentation

## 2022-08-24 DIAGNOSIS — F17200 Nicotine dependence, unspecified, uncomplicated: Secondary | ICD-10-CM

## 2022-08-24 DIAGNOSIS — F1010 Alcohol abuse, uncomplicated: Secondary | ICD-10-CM

## 2022-08-24 DIAGNOSIS — Z87828 Personal history of other (healed) physical injury and trauma: Secondary | ICD-10-CM | POA: Insufficient documentation

## 2022-08-24 DIAGNOSIS — F5104 Psychophysiologic insomnia: Secondary | ICD-10-CM

## 2022-08-24 DIAGNOSIS — Z8659 Personal history of other mental and behavioral disorders: Secondary | ICD-10-CM

## 2022-08-24 DIAGNOSIS — F331 Major depressive disorder, recurrent, moderate: Secondary | ICD-10-CM

## 2022-08-24 DIAGNOSIS — Z86711 Personal history of pulmonary embolism: Secondary | ICD-10-CM

## 2022-08-24 MED ORDER — ESCITALOPRAM OXALATE 20 MG PO TABS: 20.0000 mg | ORAL_TABLET | Freq: Every day | ORAL | 1 refills | Status: DC

## 2022-08-24 MED ORDER — HYDROXYZINE HCL 25 MG PO TABS: 25.0000 mg | ORAL_TABLET | Freq: Three times a day (TID) | ORAL | 1 refills | Status: DC | PRN

## 2022-08-24 MED ORDER — TRAZODONE HCL 50 MG PO TABS: ORAL_TABLET | ORAL | 1 refills | Status: DC

## 2022-08-24 MED ORDER — APIXABAN 5 MG PO TABS: 5.0000 mg | ORAL_TABLET | Freq: Two times a day (BID) | ORAL | 1 refills | Status: DC

## 2022-08-24 NOTE — Patient Instructions (Signed)
To help with cost of medication, you will transition to Eliquis 5 mg twice daily after supply of Xarelto is exhausted.  I have sent a referral for you to be seen by pulmonology for follow-up of your history of blood clot.  To help with sleep, you are going to discontinue the melatonin, and do a trial of trazodone by itself, you will take 1/2-1 full tablet at bedtime as needed.  You will follow-up with the mobile unit in 2 weeks.  Roney Jaffe, PA-C Physician Assistant [facility] [email or web address]   Insomnia Insomnia is a sleep disorder that makes it difficult to fall asleep or stay asleep. Insomnia can cause fatigue, low energy, difficulty concentrating, mood swings, and poor performance at work or school. There are three different ways to classify insomnia: Difficulty falling asleep. Difficulty staying asleep. Waking up too early in the morning. Any type of insomnia can be long-term (chronic) or short-term (acute). Both are common. Short-term insomnia usually lasts for 3 months or less. Chronic insomnia occurs at least three times a week for longer than 3 months. What are the causes? Insomnia may be caused by another condition, situation, or substance, such as: Having certain mental health conditions, such as anxiety and depression. Using caffeine, alcohol, tobacco, or drugs. Having gastrointestinal conditions, such as gastroesophageal reflux disease (GERD). Having certain medical conditions. These include: Asthma. Alzheimer's disease. Stroke. Chronic pain. An overactive thyroid gland (hyperthyroidism). Other sleep disorders, such as restless legs syndrome and sleep apnea. Menopause. Sometimes, the cause of insomnia may not be known. What increases the risk? Risk factors for insomnia include: Gender. Females are affected more often than males. Age. Insomnia is more common as people get older. Stress and certain  medical and mental health conditions. Lack of exercise. Having an irregular work schedule. This may include working night shifts and traveling between different time zones. What are the signs or symptoms? If you have insomnia, the main symptom is having trouble falling asleep or having trouble staying asleep. This may lead to other symptoms, such as: Feeling tired or having low energy. Feeling nervous about going to sleep. Not feeling rested in the morning. Having trouble concentrating. Feeling irritable, anxious, or depressed. How is this diagnosed? This condition may be diagnosed based on: Your symptoms and medical history. Your health care provider may ask about: Your sleep habits. Any medical conditions you have. Your mental health. A physical exam. How is this treated? Treatment for insomnia depends on the cause. Treatment may focus on treating an underlying condition that is causing the insomnia. Treatment may also include: Medicines to help you sleep. Counseling or therapy. Lifestyle adjustments to help you sleep better. Follow these instructions at home: Eating and drinking  Limit or avoid alcohol, caffeinated beverages, and products that contain nicotine and tobacco, especially close to bedtime. These can disrupt your sleep. Do not eat a large meal or eat spicy foods right before bedtime. This can lead to digestive discomfort that can make it hard for you to sleep. Sleep habits  Keep a sleep diary to help you and your health care provider figure out what could be causing your insomnia. Write down: When you sleep. When you wake up during the night. How well you sleep and how rested you feel the next day. Any side effects of medicines you are taking. What you eat and drink. Make your bedroom a dark, comfortable place where it is easy to fall asleep. Put up shades or blackout curtains to block  light from outside. Use a white noise machine to block noise. Keep the  temperature cool. Limit screen use before bedtime. This includes: Not watching TV. Not using your smartphone, tablet, or computer. Stick to a routine that includes going to bed and waking up at the same times every day and night. This can help you fall asleep faster. Consider making a quiet activity, such as reading, part of your nighttime routine. Try to avoid taking naps during the day so that you sleep better at night. Get out of bed if you are still awake after 15 minutes of trying to sleep. Keep the lights down, but try reading or doing a quiet activity. When you feel sleepy, go back to bed. General instructions Take over-the-counter and prescription medicines only as told by your health care provider. Exercise regularly as told by your health care provider. However, avoid exercising in the hours right before bedtime. Use relaxation techniques to manage stress. Ask your health care provider to suggest some techniques that may work well for you. These may include: Breathing exercises. Routines to release muscle tension. Visualizing peaceful scenes. Make sure that you drive carefully. Do not drive if you feel very sleepy. Keep all follow-up visits. This is important. Contact a health care provider if: You are tired throughout the day. You have trouble in your daily routine due to sleepiness. You continue to have sleep problems, or your sleep problems get worse. Get help right away if: You have thoughts about hurting yourself or someone else. Get help right away if you feel like you may hurt yourself or others, or have thoughts about taking your own life. Go to your nearest emergency room or: Call 911. Call the [facility] at [phone] or 988. This is open 24 hours a day. Text the Crisis Text Line at (718) 646-7887. Summary Insomnia is a sleep disorder that makes it difficult to fall asleep or stay asleep. Insomnia can be long-term (chronic) or short-term  (acute). Treatment for insomnia depends on the cause. Treatment may focus on treating an underlying condition that is causing the insomnia. Keep a sleep diary to help you and your health care provider figure out what could be causing your insomnia. This information is not intended to replace advice given to you by your health care provider. Make sure you discuss any questions you have with your health care provider. Document Revised: 08/11/2021 Document Reviewed: 08/11/2021 Elsevier Patient Education  [place].
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
## 2022-08-31 MED ORDER — CLOPIDOGREL BISULFATE 75 MG PO TABS: 75.0000 mg | ORAL_TABLET | Freq: Every day | ORAL | 1 refills | Status: DC

## 2022-08-31 NOTE — Addendum Note (Signed)
Addended by: Roney Jaffe on: 08/31/2022 03:49 PM   Modules accepted: Orders

## 2022-09-03 ENCOUNTER — Emergency Department (HOSPITAL_BASED_OUTPATIENT_CLINIC_OR_DEPARTMENT_OTHER)
Admission: EM | Admit: 2022-09-03 | Discharge: 2022-09-03 | Disposition: A | Discharge status: Home / Self Care | Payer: Self-pay | Attending: Emergency Medicine | Admitting: Emergency Medicine

## 2022-09-03 ENCOUNTER — Emergency Department (HOSPITAL_BASED_OUTPATIENT_CLINIC_OR_DEPARTMENT_OTHER): Payer: Self-pay

## 2022-09-03 ENCOUNTER — Encounter (HOSPITAL_BASED_OUTPATIENT_CLINIC_OR_DEPARTMENT_OTHER): Payer: Self-pay | Admitting: Emergency Medicine

## 2022-09-03 DIAGNOSIS — Z7902 Long term (current) use of antithrombotics/antiplatelets: Secondary | ICD-10-CM | POA: Insufficient documentation

## 2022-09-03 DIAGNOSIS — R109 Unspecified abdominal pain: Secondary | ICD-10-CM | POA: Insufficient documentation

## 2022-09-03 LAB — URINALYSIS, ROUTINE W REFLEX MICROSCOPIC
Bilirubin Urine: NEGATIVE
Glucose, UA: NEGATIVE mg/dL
Hgb urine dipstick: NEGATIVE
Ketones, ur: NEGATIVE mg/dL
Leukocytes,Ua: NEGATIVE
Nitrite: NEGATIVE
Protein, ur: NEGATIVE mg/dL
Specific Gravity, Urine: >=1.03 (ref 1.005–1.030)
pH: 5.5 (ref 5.0–8.0)

## 2022-09-03 LAB — CBC
HCT: 40.9 % (ref 39.0–52.0)
Hemoglobin: 13.2 g/dL (ref 13.0–17.0)
MCH: 28.9 pg (ref 26.0–34.0)
MCHC: 32.3 g/dL (ref 30.0–36.0)
MCV: 89.7 fL (ref 80.0–100.0)
Platelets: 277 10*3/uL (ref 150–400)
RBC: 4.56 MIL/uL (ref 4.22–5.81)
RDW: 14.4 % (ref 11.5–15.5)
WBC: 6 10*3/uL (ref 4.0–10.5)
nRBC: 0 % (ref 0.0–0.2)

## 2022-09-03 LAB — COMPREHENSIVE METABOLIC PANEL
ALT: 23 U/L (ref 0–44)
AST: 44 U/L — ABNORMAL HIGH (ref 15–41)
Albumin: 3.9 g/dL (ref 3.5–5.0)
Alkaline Phosphatase: 60 U/L (ref 38–126)
Anion gap: 6 (ref 5–15)
BUN: 7 mg/dL (ref 6–20)
CO2: 24 mmol/L (ref 22–32)
Calcium: 9 mg/dL (ref 8.9–10.3)
Chloride: 104 mmol/L (ref 98–111)
Creatinine, Ser: 0.91 mg/dL (ref 0.61–1.24)
GFR, Estimated: >60 mL/min (ref >60)
Glucose, Bld: 86 mg/dL (ref 70–99)
Potassium: 4.1 mmol/L (ref 3.5–5.1)
Sodium: 134 mmol/L — ABNORMAL LOW (ref 135–145)
Total Bilirubin: 0.4 mg/dL (ref 0.3–1.2)
Total Protein: 7.8 g/dL (ref 6.5–8.1)

## 2022-09-03 MED ORDER — IOHEXOL 300 MG/ML  SOLN
100.0000 mL | Freq: Once | INTRAMUSCULAR | Status: AC | PRN
  Administered 2022-09-03: 100 mL via INTRAVENOUS

## 2022-09-03 NOTE — Discharge Instructions (Addendum)
It was our pleasure to provide your ER care today - we hope that you feel better.  Drink plenty of fluids/stay well hydrated. Continue current meds.   Follow up with primary care doctor in the coming week if symptoms fail to improve/resolve.  Return to ER if worse, new symptoms, fevers, new or worsening or severe abdominal pain, persistent vomiting, chest pain, trouble breathing, or other emergency concern.   You had questions about how long you are supposed to continue Xarelto after having had a blood clot.  Should have a follow-up recommendation and prescribing physician for your Xarelto.  If you do not have that follow-up established and do not have a physician managing your Xarelto, vein and vascular specialist in [facility] manage blood clots and blood thinners.  Call for a follow-up appointment and management in their clinic.  Some people only takes Xarelto for a specific period of time after having a blood clot, some people require it for their entire life.  It is very important that you get correct recommendations on the duration of treatment with Xarelto.  Do not start and stop it on your own, without first reviewing it with a physician.

## 2022-09-03 NOTE — ED Provider Notes (Signed)
[facility] EMERGENCY DEPARTMENT Provider Note   CSN: [id number] Arrival date & time: 09/03/22  1121     History  Chief Complaint  Patient presents with   Abdominal Pain         Joseph [name] is a 26 y.o. male.  Pt c/o right sided abd pain in the past day. Symptoms acute onset, moderate, persistent, non radiating. No vomiting. Having normal bms. +normal appetite. No dysuria, hematuria or gu c/o. No back/flank pain. No chest pain or sob. No cough or uri symptoms. No fever or chills. Pt indicates compliant w normal meds, including anticoagulant therapy - denies abnormal bruising or bleeding, no melena or rectal bleeding.   The history is provided by the patient and the EMS personnel. The history is limited by the condition of the patient.  Abdominal Pain Associated symptoms: no chest pain, no cough, no diarrhea, no dysuria, no fever, no hematuria, no shortness of breath, no sore throat and no vomiting        Home Medications Prior to Admission medications   Medication Sig Start Date End Date Taking? Authorizing Provider  clopidogrel (PLAVIX) 75 MG tablet Take 1 tablet (75 mg total) by mouth daily. 08/31/22   Mayers, Cari S, PA-C  escitalopram (LEXAPRO) 20 MG tablet Take 1 tablet (20 mg total) by mouth daily. 08/24/22   Mayers, Cari S, PA-C  hydrOXYzine (ATARAX) 25 MG tablet Take 1 tablet (25 mg total) by mouth 3 (three) times daily as needed for anxiety. 08/24/22   Mayers, Cari S, PA-C  melatonin 5 MG TABS Take 1 tablet (5 mg total) by mouth at bedtime. 08/20/22 09/19/22  Lamar Sprinkles, MD  nicotine (NICODERM CQ - DOSED IN MG/24 HOURS) 21 mg/24hr patch Place 1 patch (21 mg total) onto the skin daily. 08/20/22   Lamar Sprinkles, MD  traZODone (DESYREL) 50 MG tablet Use 1/2 - 1 full tablet PO QHS PRN for insomia 08/24/22   Mayers, Cari S, PA-C      Allergies    Patient has no known allergies.    Review of Systems   Review of Systems  Constitutional:  Negative for  fever.  HENT:  Negative for sore throat.   Eyes:  Negative for redness.  Respiratory:  Negative for cough and shortness of breath.   Cardiovascular:  Negative for chest pain and leg swelling.  Gastrointestinal:  Positive for abdominal pain. Negative for diarrhea and vomiting.  Genitourinary:  Negative for dysuria, flank pain and hematuria.  Musculoskeletal:  Negative for back pain.  Skin:  Negative for rash.  Neurological:  Negative for headaches.  Hematological:  Does not bruise/bleed easily.  Psychiatric/Behavioral:  Negative for confusion.     Physical Exam Updated Vital Signs BP 121/69 (BP Location: Left Arm)   Pulse 67   Temp 97.8 F (36.6 C) (Oral)   Resp 20   Ht 1.803 m (5\' 11" )   Wt 90.7 kg   SpO2 96%   BMI 27.89 kg/m  Physical Exam Vitals and nursing note reviewed.  Constitutional:      Appearance: Normal appearance. He is well-developed.  HENT:     Head: Atraumatic.     Nose: Nose normal.     Mouth/Throat:     Mouth: Mucous membranes are moist.  Eyes:     General: No scleral icterus.    Conjunctiva/sclera: Conjunctivae normal.  Neck:     Trachea: No tracheal deviation.  Cardiovascular:     Rate and Rhythm: Normal rate and regular  rhythm.     Pulses: Normal pulses.     Heart sounds: Normal heart sounds. No murmur heard.    No friction rub. No gallop.  Pulmonary:     Effort: Pulmonary effort is normal. No accessory muscle usage or respiratory distress.     Breath sounds: Normal breath sounds.  Abdominal:     General: Bowel sounds are normal. There is no distension.     Palpations: Abdomen is soft. There is no mass.     Tenderness: There is abdominal tenderness. There is no guarding or rebound.     Hernia: No hernia is present.     Comments: Right abd tenderness. Well healed surgical scar without sign of infection  Genitourinary:    Comments: No cva tenderness. Musculoskeletal:        General: No swelling or tenderness.     Cervical back: Normal range  of motion and neck supple. No rigidity.     Right lower leg: No edema.     Left lower leg: No edema.  Skin:    General: Skin is warm and dry.     Findings: No rash.  Neurological:     Mental Status: He is alert.     Comments: Alert, speech clear.   Psychiatric:        Mood and Affect: Mood normal.     ED Results / Procedures / Treatments   Labs (all labs ordered are listed, but only abnormal results are displayed) Results for orders placed or performed during the hospital encounter of 09/03/22  CBC  Result Value Ref Range   WBC 6.0 4.0 - 10.5 K/uL   RBC 4.56 4.22 - 5.81 MIL/uL   Hemoglobin 13.2 13.0 - 17.0 g/dL   HCT 56.3 87.5 - 64.3 %   MCV 89.7 80.0 - 100.0 fL   MCH 28.9 26.0 - 34.0 pg   MCHC 32.3 30.0 - 36.0 g/dL   RDW 32.9 51.8 - 84.1 %   Platelets 277 150 - 400 K/uL   nRBC 0.0 0.0 - 0.2 %  Comprehensive metabolic panel  Result Value Ref Range   Sodium 134 (L) 135 - 145 mmol/L   Potassium 4.1 3.5 - 5.1 mmol/L   Chloride 104 98 - 111 mmol/L   CO2 24 22 - 32 mmol/L   Glucose, Bld 86 70 - 99 mg/dL   BUN 7 6 - 20 mg/dL   Creatinine, Ser 6.60 0.61 - 1.24 mg/dL   Calcium 9.0 8.9 - 63.0 mg/dL   Total Protein 7.8 6.5 - 8.1 g/dL   Albumin 3.9 3.5 - 5.0 g/dL   AST 44 (H) 15 - 41 U/L   ALT 23 0 - 44 U/L   Alkaline Phosphatase 60 38 - 126 U/L   Total Bilirubin 0.4 0.3 - 1.2 mg/dL   GFR, Estimated >16 >01 mL/min   Anion gap 6 5 - 15  Urinalysis, Routine w reflex microscopic Urine, Clean Catch  Result Value Ref Range   Color, Urine YELLOW YELLOW   APPearance CLEAR CLEAR   Specific Gravity, Urine >=1.030 1.005 - 1.030   pH 5.5 5.0 - 8.0   Glucose, UA NEGATIVE NEGATIVE mg/dL   Hgb urine dipstick NEGATIVE NEGATIVE   Bilirubin Urine NEGATIVE NEGATIVE   Ketones, ur NEGATIVE NEGATIVE mg/dL   Protein, ur NEGATIVE NEGATIVE mg/dL   Nitrite NEGATIVE NEGATIVE   Leukocytes,Ua NEGATIVE NEGATIVE   DG Chest 2 View  Result Date: 08/12/2022 CLINICAL DATA:  Chest pain EXAM: CHEST  -  2 VIEW COMPARISON:  07/27/2022 FINDINGS: The heart size and mediastinal contours are within normal limits. Both lungs are clear. The visualized skeletal structures are unremarkable. IMPRESSION: No active cardiopulmonary disease. Electronically Signed   By: Alcide Clever M.D.   On: 08/12/2022 03:39    EKG None  Radiology No results found.  Procedures Procedures    Medications Ordered in ED Medications - No data to display  ED Course/ Medical Decision Making/ A&P                           Medical Decision Making Problems Addressed: Right sided abdominal pain: acute illness or injury with systemic symptoms that poses a threat to life or bodily functions  Amount and/or Complexity of Data Reviewed External Data Reviewed: notes. Labs: ordered. Decision-making details documented in ED Course. Radiology: ordered and independent interpretation performed. Decision-making details documented in ED Course.  Risk Prescription drug management. Decision regarding hospitalization.   Iv ns. Continuous pulse ox and cardiac monitoring. Labs ordered/sent. Imaging ordered.   Diff dx includes sbo, acute abd infection, abd cramping, constipation, etc - dispo decision including potential need for admission considered - will get labs and imaging and reassess.   Reviewed nursing notes and prior charts for additional history. External reports reviewed. Additional history from: EMS.   Cardiac monitor: sinus rhythm, rate 68.  Labs reviewed/interpreted by me - wbc normal.   CT reviewed/interpreted by me - no sbo.   At shift change, labs and imaging pending - signed out to oncoming EDP to recheck and dispo appropriately.          Final Clinical Impression(s) / ED Diagnoses Final diagnoses:  None    Rx / DC Orders ED Discharge Orders     None         Cathren Laine, MD 09/09/22 1642

## 2022-09-03 NOTE — ED Notes (Signed)
ED Provider at bedside. 

## 2022-09-03 NOTE — ED Notes (Signed)
[facility] called to notify of pt d/c, state they will send someone to pick him up

## 2022-09-03 NOTE — ED Provider Notes (Signed)
F/u labs CT. Anticipate d/c if normal results. Physical Exam  BP 123/64 (BP Location: Left Arm)   Pulse 63   Temp 97.8 F (36.6 C) (Oral)   Resp 18   Ht 5\' 11"  (1.803 m)   Wt 90.7 kg   SpO2 100%   BMI 27.89 kg/m   Physical Exam  Procedures  Procedures  ED Course / MDM    Medical Decision Making Amount and/or Complexity of Data Reviewed Labs: ordered. Radiology: ordered.  Risk Prescription drug management.   Patient's labs and CT within normal limits.  He reports his pain is on the right lateral mid abdomen.  Abdomen is soft.  There is no guarding.  He has well-healed surgical scars.  Patient's main concern is regarding possibility of blood clots because he has had them previously and is on Xarelto.  We extensively reviewed the nature of venous clots, arterial clots and the use of anticoagulants and necessary follow-up for safely starting and stopping anticoagulants.  Discussed the fact that some people require lifetime anticoagulation while others have time-limited requirements based on their clot history.  Patient has no evidence of acute bleeding.  He wanted to know if he could stop the Xarelto.  Patient was counseled on necessity of follow-up with his prescribing physician with the trauma team or follow-up with the vein and vascular specialist for appropriate management of Xarelto.  Contact information for vein and vascular specialist also included in discharge instructions.       , MD 09/03/22 [phone]

## 2022-09-03 NOTE — ED Triage Notes (Signed)
BIB EMS from [facility] reporting RLQ abdominal onset yesterday. Denies N/V/D.
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
## 2023-04-17 ENCOUNTER — Ambulatory Visit (HOSPITAL_COMMUNITY)
Admission: EM | Admit: 2023-04-17 | Discharge: 2023-04-18 | Payer: No Typology Code available for payment source | Attending: Psychiatry | Admitting: Psychiatry

## 2023-04-17 DIAGNOSIS — F32A Depression, unspecified: Secondary | ICD-10-CM | POA: Diagnosis not present

## 2023-04-17 DIAGNOSIS — Z91148 Patient's other noncompliance with medication regimen for other reason: Secondary | ICD-10-CM | POA: Diagnosis not present

## 2023-04-17 DIAGNOSIS — Z5900 Homelessness unspecified: Secondary | ICD-10-CM | POA: Diagnosis not present

## 2023-04-17 DIAGNOSIS — F1910 Other psychoactive substance abuse, uncomplicated: Secondary | ICD-10-CM | POA: Diagnosis not present

## 2023-04-17 DIAGNOSIS — F411 Generalized anxiety disorder: Secondary | ICD-10-CM | POA: Diagnosis not present

## 2023-04-17 DIAGNOSIS — G4700 Insomnia, unspecified: Secondary | ICD-10-CM | POA: Insufficient documentation

## 2023-04-17 DIAGNOSIS — F1990 Other psychoactive substance use, unspecified, uncomplicated: Secondary | ICD-10-CM

## 2023-04-17 DIAGNOSIS — F1920 Other psychoactive substance dependence, uncomplicated: Secondary | ICD-10-CM

## 2023-04-17 LAB — URINALYSIS, ROUTINE W REFLEX MICROSCOPIC
Bilirubin Urine: NEGATIVE
Glucose, UA: NEGATIVE mg/dL
Hgb urine dipstick: NEGATIVE
Ketones, ur: 5 mg/dL — AB
Leukocytes,Ua: NEGATIVE
Nitrite: NEGATIVE
Protein, ur: 30 mg/dL — AB
Specific Gravity, Urine: 1.032 — ABNORMAL HIGH (ref 1.005–1.030)
pH: 5 (ref 5.0–8.0)

## 2023-04-17 LAB — COMPREHENSIVE METABOLIC PANEL
ALT: 31 U/L (ref 0–44)
AST: 49 U/L — ABNORMAL HIGH (ref 15–41)
Albumin: 3.6 g/dL (ref 3.5–5.0)
Alkaline Phosphatase: 58 U/L (ref 38–126)
Anion gap: 9 (ref 5–15)
BUN: 9 mg/dL (ref 6–20)
CO2: 24 mmol/L (ref 22–32)
Calcium: 9 mg/dL (ref 8.9–10.3)
Chloride: 105 mmol/L (ref 98–111)
Creatinine, Ser: 1.14 mg/dL (ref 0.61–1.24)
GFR, Estimated: >60 mL/min (ref >60)
Glucose, Bld: 127 mg/dL — ABNORMAL HIGH (ref 70–99)
Potassium: 4 mmol/L (ref 3.5–5.1)
Sodium: 138 mmol/L (ref 135–145)
Total Bilirubin: 0.7 mg/dL (ref 0.3–1.2)
Total Protein: 6.8 g/dL (ref 6.5–8.1)

## 2023-04-17 LAB — CBC WITH DIFFERENTIAL/PLATELET
Abs Immature Granulocytes: 0.02 10*3/uL (ref 0.00–0.07)
Basophils Absolute: 0 10*3/uL (ref 0.0–0.1)
Basophils Relative: 1 %
Eosinophils Absolute: 0 10*3/uL (ref 0.0–0.5)
Eosinophils Relative: 1 %
HCT: 44.6 % (ref 39.0–52.0)
Hemoglobin: 15.2 g/dL (ref 13.0–17.0)
Immature Granulocytes: 0 %
Lymphocytes Relative: 42 %
Lymphs Abs: 2.5 10*3/uL (ref 0.7–4.0)
MCH: 30.6 pg (ref 26.0–34.0)
MCHC: 34.1 g/dL (ref 30.0–36.0)
MCV: 89.7 fL (ref 80.0–100.0)
Monocytes Absolute: 0.4 10*3/uL (ref 0.1–1.0)
Monocytes Relative: 6 %
Neutro Abs: 3.1 10*3/uL (ref 1.7–7.7)
Neutrophils Relative %: 50 %
Platelets: 308 10*3/uL (ref 150–400)
RBC: 4.97 MIL/uL (ref 4.22–5.81)
RDW: 12.8 % (ref 11.5–15.5)
WBC: 6.1 10*3/uL (ref 4.0–10.5)
nRBC: 0 % (ref 0.0–0.2)

## 2023-04-17 LAB — POCT URINE DRUG SCREEN - MANUAL ENTRY (I-SCREEN)
POC Amphetamine UR: NOT DETECTED
POC Buprenorphine (BUP): NOT DETECTED
POC Cocaine UR: POSITIVE — AB
POC Marijuana UR: POSITIVE — AB
POC Methadone UR: NOT DETECTED — AB
POC Methamphetamine UR: POSITIVE — AB
POC Morphine: NOT DETECTED
POC Oxazepam (BZO): POSITIVE — AB
POC Oxycodone UR: NOT DETECTED
POC Secobarbital (BAR): NOT DETECTED

## 2023-04-17 LAB — HEMOGLOBIN A1C
Hgb A1c MFr Bld: 5.7 % — ABNORMAL HIGH (ref 4.8–5.6)
Mean Plasma Glucose: 116.89 mg/dL

## 2023-04-17 LAB — LIPID PANEL
Cholesterol: 122 mg/dL (ref 0–200)
HDL: 49 mg/dL (ref >40)
LDL Cholesterol: 64 mg/dL (ref 0–99)
Total CHOL/HDL Ratio: 2.5 RATIO
Triglycerides: 46 mg/dL (ref <150)
VLDL: 9 mg/dL (ref 0–40)

## 2023-04-17 LAB — TSH: TSH: 0.426 u[IU]/mL (ref 0.350–4.500)

## 2023-04-17 LAB — MAGNESIUM: Magnesium: 2 mg/dL (ref 1.7–2.4)

## 2023-04-17 LAB — ETHANOL: Alcohol, Ethyl (B): <10 mg/dL (ref <10)

## 2023-04-17 MED ORDER — TRAZODONE HCL 50 MG PO TABS: 50.0000 mg | ORAL_TABLET | Freq: Every evening | ORAL | Status: DC | PRN

## 2023-04-17 MED ORDER — HYDROXYZINE HCL 25 MG PO TABS
25.0000 mg | ORAL_TABLET | Freq: Four times a day (QID) | ORAL | Status: DC | PRN
  Administered 2023-04-18: 25 mg via ORAL
  Filled 2023-04-17: qty 1

## 2023-04-17 MED ORDER — MAGNESIUM HYDROXIDE 400 MG/5ML PO SUSP: 30.0000 mL | Freq: Every day | ORAL | Status: DC | PRN

## 2023-04-17 MED ORDER — ALUM & MAG HYDROXIDE-SIMETH 200-200-20 MG/5ML PO SUSP: 30.0000 mL | ORAL | Status: DC | PRN

## 2023-04-17 MED ORDER — DICYCLOMINE HCL 20 MG PO TABS: 20.0000 mg | ORAL_TABLET | Freq: Four times a day (QID) | ORAL | Status: DC | PRN

## 2023-04-17 MED ORDER — ONDANSETRON 4 MG PO TBDP: 4.0000 mg | ORAL_TABLET | Freq: Four times a day (QID) | ORAL | Status: DC | PRN

## 2023-04-17 MED ORDER — ACETAMINOPHEN 325 MG PO TABS: 650.0000 mg | ORAL_TABLET | Freq: Four times a day (QID) | ORAL | Status: DC | PRN

## 2023-04-17 MED ORDER — LOPERAMIDE HCL 2 MG PO CAPS: 2.0000 mg | ORAL_CAPSULE | ORAL | Status: DC | PRN

## 2023-04-17 MED ORDER — NAPROXEN 500 MG PO TABS: 500.0000 mg | ORAL_TABLET | Freq: Two times a day (BID) | ORAL | Status: DC | PRN

## 2023-04-17 MED ORDER — METHOCARBAMOL 500 MG PO TABS: 500.0000 mg | ORAL_TABLET | Freq: Three times a day (TID) | ORAL | Status: DC | PRN

## 2023-04-17 NOTE — ED Notes (Signed)
Pt  admitted to obs. Denies SI/HI/AVH  currently.  Calm, cooperative throughout interview process. Skin assessment completed. Oriented to unit. Meal and drink offered. At currrent, pt continue  denies SI/HI/AVH. Pt verbally contract for safety. Will monitor for safety. Items were put in locker 23

## 2023-04-17 NOTE — ED Notes (Signed)
[facility] lab courier called to transport labs to [facility] lab courier called to transport labs to [facility] lab 

## 2023-04-17 NOTE — ED Notes (Signed)
Patient  sleeping in no acute stress. RR even and unlabored .Environment secured .Will continue to monitor for safely. 

## 2023-04-17 NOTE — ED Notes (Signed)
[facility] lab courier called to transport labs to [facility] lab 

## 2023-04-17 NOTE — ED Notes (Signed)
Pt in recliner bed, a/o, denies SI/HI/AVH. Denies s/s of withdrawal. No noted distress. Pt is pleasant and engaged. Will continue to monitor for safety
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
## 2023-04-17 NOTE — ED Notes (Signed)
Patient is in the milieu . Environment secured. Alert and oriented. Will continue to monitor for safety.

## 2023-04-17 NOTE — ED Notes (Signed)
Rn notified provider that patient could not void. And we were unable to get blood. Will try again later.

## 2023-04-17 NOTE — Progress Notes (Signed)
   04/17/23 0918  [facility] Triage Screening (Walk-ins at [facility] only)  How Did You Hear About [place]? Self  What Is the Reason for Your Visit/Call Today? Pt. arrived to [facility] alone, stating he needs to be admitted. Pt states he is trying to detox off drugs (cocaine, meth, alcohol, and fenatyol). Pt states he has a history of mental illness diagnosed w/ MDD and bad anxiety. He shares that he is homeless and wants to get his life togther. Pt denies SI/HI and visual hallucinations, but is currently experiencing AH of threats to himself. Pt. keeps walking back & forth to the door, during the triage process because he is hearing something. Pt has used unknown amount of cocaine and 3 -16oz beers straight in the last 2-3 hours before coming in the facility. Pt. reports no symptoms of DT or seizures when going through detox.  How Long Has This Been Causing You Problems? > than 6 months  Have You Recently Had Any Thoughts About Hurting Yourself? No  Are You Planning to Commit Suicide/Harm Yourself At This time? No  Have you Recently Had Thoughts About Hurting Someone [name]? No  Are You Planning To Harm Someone At This Time? No  Are you currently experiencing any auditory, visual or other hallucinations? Yes  Please explain the hallucinations you are currently experiencing: Voices in his head are threatening  Have You Used Any Alcohol or Drugs in the Past 24 Hours? Yes  How long ago did you use Drugs or Alcohol? about 2-3 hours ago  What Did You Use and How Much? Cocaine (unsure of the amount used) and alcohol (malt liquor beer about 3 16 oz beers)  Do you have any current medical co-morbidities that require immediate attention? No  Clinician description of patient physical appearance/behavior: casually dressed, walking back & forth to door (says he is hearing things), cooperative  What Do You Feel Would Help You the Most Today? Alcohol or Drug Use Treatment;Medication(s)  If access to [facility] was not available,  would you have sought care in the Emergency Department? Yes  Determination of Need Urgent (48 hours)  Options For Referral Facility-Based Crisis;Inpatient Hospitalization;Medication Management

## 2023-04-17 NOTE — ED Notes (Signed)
Blood work sent
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
## 2023-04-18 ENCOUNTER — Other Ambulatory Visit (HOSPITAL_COMMUNITY)
Admission: EM | Admit: 2023-04-18 | Discharge: 2023-04-19 | Disposition: A | Discharge status: Home / Self Care | Payer: No Typology Code available for payment source | Attending: Psychiatry | Admitting: Psychiatry

## 2023-04-18 ENCOUNTER — Encounter (HOSPITAL_COMMUNITY): Payer: Self-pay | Admitting: Student

## 2023-04-18 DIAGNOSIS — F1120 Opioid dependence, uncomplicated: Secondary | ICD-10-CM | POA: Diagnosis present

## 2023-04-18 DIAGNOSIS — F1320 Sedative, hypnotic or anxiolytic dependence, uncomplicated: Secondary | ICD-10-CM | POA: Diagnosis present

## 2023-04-18 DIAGNOSIS — F17200 Nicotine dependence, unspecified, uncomplicated: Secondary | ICD-10-CM | POA: Diagnosis present

## 2023-04-18 DIAGNOSIS — F1290 Cannabis use, unspecified, uncomplicated: Secondary | ICD-10-CM | POA: Diagnosis present

## 2023-04-18 DIAGNOSIS — F1014 Alcohol abuse with alcohol-induced mood disorder: Secondary | ICD-10-CM | POA: Diagnosis present

## 2023-04-18 DIAGNOSIS — F1590 Other stimulant use, unspecified, uncomplicated: Secondary | ICD-10-CM | POA: Diagnosis present

## 2023-04-18 DIAGNOSIS — F411 Generalized anxiety disorder: Secondary | ICD-10-CM | POA: Diagnosis not present

## 2023-04-18 DIAGNOSIS — F19930 Other psychoactive substance use, unspecified with withdrawal, uncomplicated: Secondary | ICD-10-CM

## 2023-04-18 DIAGNOSIS — F4310 Post-traumatic stress disorder, unspecified: Secondary | ICD-10-CM | POA: Diagnosis present

## 2023-04-18 LAB — RPR: RPR Ser Ql: NONREACTIVE

## 2023-04-18 MED ORDER — THIAMINE MONONITRATE 100 MG PO TABS
100.0000 mg | ORAL_TABLET | Freq: Every day | ORAL | Status: DC
  Administered 2023-04-18 – 2023-04-19 (×2): 100 mg via ORAL
  Filled 2023-04-18 (×2): qty 1

## 2023-04-18 MED ORDER — DICYCLOMINE HCL 20 MG PO TABS: 20.0000 mg | ORAL_TABLET | Freq: Four times a day (QID) | ORAL | Status: DC | PRN

## 2023-04-18 MED ORDER — ACETAMINOPHEN 325 MG PO TABS: 650.0000 mg | ORAL_TABLET | Freq: Four times a day (QID) | ORAL | Status: DC | PRN

## 2023-04-18 MED ORDER — HYDROXYZINE HCL 25 MG PO TABS: 25.0000 mg | ORAL_TABLET | Freq: Four times a day (QID) | ORAL | Status: DC | PRN

## 2023-04-18 MED ORDER — LOPERAMIDE HCL 2 MG PO CAPS: 2.0000 mg | ORAL_CAPSULE | ORAL | Status: DC | PRN

## 2023-04-18 MED ORDER — NAPROXEN 500 MG PO TABS: 500.0000 mg | ORAL_TABLET | Freq: Two times a day (BID) | ORAL | Status: DC | PRN

## 2023-04-18 MED ORDER — METHOCARBAMOL 500 MG PO TABS: 500.0000 mg | ORAL_TABLET | Freq: Three times a day (TID) | ORAL | Status: DC | PRN

## 2023-04-18 MED ORDER — ONDANSETRON 4 MG PO TBDP: 4.0000 mg | ORAL_TABLET | Freq: Four times a day (QID) | ORAL | Status: DC | PRN

## 2023-04-18 MED ORDER — NALOXONE HCL 0.4 MG/ML IJ SOLN: 0.4000 mg | INTRAMUSCULAR | Status: DC | PRN

## 2023-04-18 MED ORDER — ADULT MULTIVITAMIN W/MINERALS CH
1.0000 | ORAL_TABLET | Freq: Every day | ORAL | Status: DC
  Administered 2023-04-18 – 2023-04-19 (×2): 1 via ORAL
  Filled 2023-04-18 (×2): qty 1

## 2023-04-18 MED ORDER — MAGNESIUM HYDROXIDE 400 MG/5ML PO SUSP: 30.0000 mL | Freq: Every day | ORAL | Status: DC | PRN

## 2023-04-18 MED ORDER — ESCITALOPRAM OXALATE 10 MG PO TABS
10.0000 mg | ORAL_TABLET | Freq: Every day | ORAL | Status: DC
  Administered 2023-04-18 – 2023-04-19 (×2): 10 mg via ORAL
  Filled 2023-04-18 (×2): qty 1

## 2023-04-18 MED ORDER — NICOTINE 7 MG/24HR TD PT24: 7.0000 mg | MEDICATED_PATCH | Freq: Every day | TRANSDERMAL | Status: DC | PRN

## 2023-04-18 MED ORDER — ALUM & MAG HYDROXIDE-SIMETH 200-200-20 MG/5ML PO SUSP: 30.0000 mL | ORAL | Status: DC | PRN

## 2023-04-18 MED ORDER — LORAZEPAM 1 MG PO TABS: 1.0000 mg | ORAL_TABLET | Freq: Four times a day (QID) | ORAL | Status: DC | PRN

## 2023-04-18 MED ORDER — TRAZODONE HCL 50 MG PO TABS: 50.0000 mg | ORAL_TABLET | Freq: Every evening | ORAL | Status: DC | PRN

## 2023-04-18 NOTE — ED Notes (Signed)
Patient refused dinner despite being offered several times.  He remains in bed and is easily arousable by voice.  He is tired but does not seem sedated.  Will monitor.

## 2023-04-18 NOTE — ED Notes (Signed)
Pt sleeping in no acute stress. Respirations are even and unlabored. Will continue to monitor for safety.
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
## 2023-04-18 NOTE — ED Notes (Addendum)
Pt is currently sleeping. Respirations are even and unlabored. Will continue to monitor for safety.

## 2023-04-18 NOTE — ED Notes (Signed)
Patient observed/assessed at bedside lying in bed asleep. Patient alert and oriented to self and location. patient seems to be disturbed from sleeping. Patient denies pain and anxiety. He denies A/V/H. He denies having any thoughts/plan of self harm and harm towards others.  Patient states that appetite has been good throughout the day. Verbalizes no further complaints at this time. Will continue to monitor and support.

## 2023-04-18 NOTE — ED Notes (Signed)
Pt was provided with cereal, oatmeal and juice for breakfast
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
## 2023-04-18 NOTE — ED Notes (Signed)
Pt observed/assessed in recliner bed sleeping. RR even and unlabored, appearing in no noted distress. Environmental check complete, will continue to monitor for safety
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
## 2023-04-18 NOTE — ED Notes (Signed)
Patient reports feeling "tired."  Denies opiate use.  Is easily arousable by voice.  Will monitor for over sedation as requested by M.D.

## 2023-04-18 NOTE — ED Notes (Signed)
Patient admitted ot [facility] from [facility] for etoh detox.  Patient calm and pleasant.  Cooperative with admission process.  He is known to unit and was reoriented to unit rules.  Patient given a snack and shown to room.  He is without withdrawal at this time.

## 2023-04-18 NOTE — ED Notes (Signed)
Patient is resting comfortably. 

## 2023-04-18 NOTE — ED Notes (Signed)
Patient resting with no sxs of distress noted - will continue to monitor for safety 

## 2023-04-18 NOTE — ED Notes (Signed)
Patient alert and oriented x 3. Denies SI/HI/AVH. Denies intent or plan to harm self or others. Routine conducted according to faculty protocol. Encourage patient to notify staff with any needs or concerns. Patient verbalized agreement and understanding. Will continue to monitor for safety. 

## 2023-04-18 NOTE — ED Notes (Signed)
Patient has remained asleep in bed without issue or concern.  He was offered but refused lunch.  He is without withdrawal symptoms at this time.  Will monitor.
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
## 2023-04-18 NOTE — ED Notes (Signed)
Patient continues to rest with no sxs of distress noted - will continue to monitor for safety 

## 2023-04-18 NOTE — Discharge Instructions (Signed)
Transferring to [facility]

## 2023-04-18 NOTE — ED Notes (Signed)
MHT was informed that patient was having trouble with his breathing. MHT asked the patient how his breathing was and the patient stated he is no longer having issues. He also stated he just wanted to rest due to the meds he was taking making him tired.

## 2023-04-18 NOTE — ED Notes (Signed)
Pt was provided lunch

## 2023-04-18 NOTE — ED Notes (Signed)
Patient is transferring to [facility]

## 2023-04-18 NOTE — ED Notes (Signed)
Rn called report to [facility] nurse.

## 2023-04-18 NOTE — ED Notes (Signed)
Pt is awake at this time getting is vitals taken, irritable when asked. Respirations are even and unlabored. Will continue to monitor for safety.

## 2023-04-18 NOTE — ED Notes (Signed)
Patient is sleeping. Respirations equal and unlabored, skin warm and dry. No change in assessment or acuity. Routine safety checks conducted according to facility protocol. Will continue to monitor for safety.   

## 2023-04-18 NOTE — ED Notes (Signed)
Patient  sleeping in no acute stress. RR even and unlabored .Environment secured .Will continue to monitor for safely. 

## 2023-04-18 NOTE — Plan of Care (Signed)
On 2nd encounter with attending, patient appeared sleepier than he was this morning, saying he received a vistaril prior to this, MAR shows that he did not receive any. Concern for possible over-exaggerating his sxs.  However, given h/o of polysubstance use, especially opioids, will order prn narcan and have 15 minute checks until patient improves.

## 2023-04-19 ENCOUNTER — Ambulatory Visit (HOSPITAL_COMMUNITY)
Admission: EM | Admit: 2023-04-19 | Discharge: 2023-04-20 | Disposition: A | Discharge status: Home / Self Care | Payer: No Typology Code available for payment source | Source: Home / Self Care

## 2023-04-19 DIAGNOSIS — F1510 Other stimulant abuse, uncomplicated: Secondary | ICD-10-CM | POA: Diagnosis not present

## 2023-04-19 DIAGNOSIS — F1911 Other psychoactive substance abuse, in remission: Secondary | ICD-10-CM | POA: Insufficient documentation

## 2023-04-19 DIAGNOSIS — F19159 Other psychoactive substance abuse with psychoactive substance-induced psychotic disorder, unspecified: Secondary | ICD-10-CM | POA: Insufficient documentation

## 2023-04-19 DIAGNOSIS — F10129 Alcohol abuse with intoxication, unspecified: Secondary | ICD-10-CM

## 2023-04-19 DIAGNOSIS — Z5900 Homelessness unspecified: Secondary | ICD-10-CM | POA: Insufficient documentation

## 2023-04-19 DIAGNOSIS — F17210 Nicotine dependence, cigarettes, uncomplicated: Secondary | ICD-10-CM | POA: Insufficient documentation

## 2023-04-19 DIAGNOSIS — F1990 Other psychoactive substance use, unspecified, uncomplicated: Secondary | ICD-10-CM | POA: Diagnosis present

## 2023-04-19 DIAGNOSIS — F329 Major depressive disorder, single episode, unspecified: Secondary | ICD-10-CM | POA: Insufficient documentation

## 2023-04-19 DIAGNOSIS — F4310 Post-traumatic stress disorder, unspecified: Secondary | ICD-10-CM | POA: Diagnosis not present

## 2023-04-19 DIAGNOSIS — F410 Panic disorder [episodic paroxysmal anxiety] without agoraphobia: Secondary | ICD-10-CM | POA: Insufficient documentation

## 2023-04-19 DIAGNOSIS — F1110 Opioid abuse, uncomplicated: Secondary | ICD-10-CM | POA: Insufficient documentation

## 2023-04-19 DIAGNOSIS — F1590 Other stimulant use, unspecified, uncomplicated: Secondary | ICD-10-CM | POA: Diagnosis present

## 2023-04-19 DIAGNOSIS — F32A Depression, unspecified: Secondary | ICD-10-CM | POA: Diagnosis not present

## 2023-04-19 DIAGNOSIS — F1910 Other psychoactive substance abuse, uncomplicated: Secondary | ICD-10-CM

## 2023-04-19 DIAGNOSIS — F1190 Opioid use, unspecified, uncomplicated: Secondary | ICD-10-CM | POA: Insufficient documentation

## 2023-04-19 DIAGNOSIS — F1010 Alcohol abuse, uncomplicated: Secondary | ICD-10-CM | POA: Insufficient documentation

## 2023-04-19 MED ORDER — ZIPRASIDONE MESYLATE 20 MG IM SOLR: 20.0000 mg | INTRAMUSCULAR | Status: DC | PRN

## 2023-04-19 MED ORDER — NICOTINE 7 MG/24HR TD PT24: 7.0000 mg | MEDICATED_PATCH | Freq: Every day | TRANSDERMAL | 0 refills | Status: AC | PRN

## 2023-04-19 MED ORDER — HYDROXYZINE HCL 25 MG PO TABS: 25.0000 mg | ORAL_TABLET | Freq: Three times a day (TID) | ORAL | Status: DC | PRN

## 2023-04-19 MED ORDER — LOPERAMIDE HCL 2 MG PO CAPS: 2.0000 mg | ORAL_CAPSULE | ORAL | Status: DC | PRN

## 2023-04-19 MED ORDER — BISMUTH SUBSALICYLATE 262 MG PO CHEW: 524.0000 mg | CHEWABLE_TABLET | ORAL | Status: DC | PRN

## 2023-04-19 MED ORDER — ALUM & MAG HYDROXIDE-SIMETH 200-200-20 MG/5ML PO SUSP: 30.0000 mL | ORAL | Status: DC | PRN

## 2023-04-19 MED ORDER — MAGNESIUM HYDROXIDE 400 MG/5ML PO SUSP: 30.0000 mL | Freq: Every day | ORAL | Status: DC | PRN

## 2023-04-19 MED ORDER — NAPROXEN 500 MG PO TABS: 500.0000 mg | ORAL_TABLET | Freq: Two times a day (BID) | ORAL | Status: DC | PRN

## 2023-04-19 MED ORDER — METHOCARBAMOL 500 MG PO TABS: 500.0000 mg | ORAL_TABLET | Freq: Three times a day (TID) | ORAL | Status: DC | PRN

## 2023-04-19 MED ORDER — MELATONIN 3 MG PO TABS: 3.0000 mg | ORAL_TABLET | Freq: Every evening | ORAL | Status: DC | PRN

## 2023-04-19 MED ORDER — POLYETHYLENE GLYCOL 3350 17 G PO PACK: 17.0000 g | PACK | Freq: Every day | ORAL | Status: DC | PRN

## 2023-04-19 MED ORDER — LORAZEPAM 1 MG PO TABS
1.0000 mg | ORAL_TABLET | ORAL | Status: AC | PRN
  Administered 2023-04-20: 1 mg via ORAL
  Filled 2023-04-19: qty 1

## 2023-04-19 MED ORDER — ONDANSETRON HCL 4 MG PO TABS: 8.0000 mg | ORAL_TABLET | Freq: Three times a day (TID) | ORAL | Status: DC | PRN

## 2023-04-19 MED ORDER — ACETAMINOPHEN 325 MG PO TABS: 650.0000 mg | ORAL_TABLET | Freq: Four times a day (QID) | ORAL | Status: DC | PRN

## 2023-04-19 MED ORDER — CLONIDINE HCL 0.1 MG PO TABS: 0.1000 mg | ORAL_TABLET | ORAL | Status: DC | PRN

## 2023-04-19 MED ORDER — OLANZAPINE 10 MG PO TBDP
10.0000 mg | ORAL_TABLET | Freq: Three times a day (TID) | ORAL | Status: DC | PRN
  Administered 2023-04-20: 10 mg via ORAL
  Filled 2023-04-19: qty 1

## 2023-04-19 MED ORDER — DICYCLOMINE HCL 20 MG PO TABS: 20.0000 mg | ORAL_TABLET | Freq: Four times a day (QID) | ORAL | Status: DC | PRN

## 2023-04-19 MED ORDER — SENNA 8.6 MG PO TABS: 1.0000 | ORAL_TABLET | Freq: Every evening | ORAL | Status: DC | PRN

## 2023-04-19 MED ORDER — NALOXONE HCL 0.4 MG/ML IJ SOLN: 0.4000 mg | INTRAMUSCULAR | 0 refills | Status: AC | PRN

## 2023-04-19 MED ORDER — ESCITALOPRAM OXALATE 10 MG PO TABS: 10.0000 mg | ORAL_TABLET | Freq: Every day | ORAL | 0 refills | Status: AC
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
## 2023-04-19 NOTE — Discharge Instructions (Addendum)
Dear Joseph [name],  It was a pleasure to take care of you during your stay at [facility] where you were treated for your substance withdrawal.  While you were here, you were:  observed and cared for by our nurses and nursing assistants  treated with medications by your psychiatrists  provided individual and group therapy by therapists  provided resources by our social workers and case managers  Please review the medication list provided to you at discharge and stop, start taking, or continue taking the medications listed there.  You should also follow-up with your primary care doctor, or start seeing one if you don't have one yet. If applicable, here are some scheduled follow-ups for you:    I recommend abstinence from alcohol, tobacco, and other illicit drug use.   If your psychiatric symptoms or suicidal thoughts recur, worsen, or if you have side effects to your psychiatric medications, call your outpatient psychiatric provider, 911, [phone] or go to the nearest emergency department.  Take care!  Signed: Augusto Gamble, MD 04/19/2023, 9:48 AM  For a list of more resources, see the following:  [facility] [address] [place], [place], [address] [phone] phone  New Patient Assessment/Therapy Walk-Ins:  Monday and Wednesday: 8 am until slots are full. Every 1st and 2nd Fridays of the month: 1 pm - 5 pm.  NO ASSESSMENT/THERAPY WALK-INS ON TUESDAYS OR THURSDAYS  New Patient Assessment/Medication Management Walk-Ins:  Monday - Friday:  8 am - 11 am.  For all walk-ins, we ask that you arrive by 7:30 am because patients will be seen in the order of arrival.  Availability is limited; therefore, you may not be seen on the same day that you walk-in.  Our goal is to serve and meet the needs of our community to the best of our ability.  12 STEP PROGRAMS:  Alcoholics Anonymous of [place] [employer]  Narcotics  Anonymous of [place] [employer]  Al-Anon of [place], [place] www.[place]alanon.org/find-meetings.html  Nar-Anon https://nar-anon.org/find-a-meeting  Naloxone (Narcan) can help reverse an overdose when given to the victim quickly.  [place] offers free naloxone kits and instructions/training on its use.  Add naloxone to your first aid kit and you can help save a life. A prescription can be filled at your local pharmacy or free kits are provided by the county.  Pick up your free kit at the following locations:   [place]:  [facility], [address] [place] [address] [phone]) [facility] [address] [place] [address] [phone]) [facility] [address] [place] [address]  [place]: [facility] of [facility] [address] [place] [address] ([phone]) [facility] [address] [place] [address] [phone])  Based on the information that you have provided and the presenting issues outpatient services and resources for have been recommended.  It is imperative that you follow through with treatment recommendations within 5-7 days from the of discharge to mitigate further risk to your safety and mental well-being. A list of referrals has been provided below to get you started.  You are not limited to the list provided.  In case of an urgent crisis, you may contact the Mobile Crisis Unit with [facility] at [phone].    [facility] - Admissions are currently completed Monday through Friday at 8am; both appointments and walk-ins  are accepted.  Any individual that is a [facility] resident may present for a substance abuse screening and assessment for admission.  A person may be referred by numerous sources or  self-refer.   Potential clients will be screened for medical necessity and appropriateness for the program.  Clients must meet criteria for high-intensity residential treatment services.  If clinically appropriate, a client will continue with the comprehensive clinical assessment and intake process, as well as enrollment in the [facility].  Address: [address] [place], [place] [address] Admin Hours: Mon-Fri 8AM to [facility] Center Hours: 24/7 Phone: [phone] Fax: [phone]  [facility] (Detox) Facility Based Crisis  locations: Please call before arrival   Address: [address] [place], [place] [address] Phone: [phone]  Address: [address] [place], [place] [address] Phone#: [phone]  Address: [address] [place], [place] [address] Phone#: [phone]    [facility]): (offers outpatient therapy and intensive outpatient substance abuse therapy).  [address], [place], [place] [address] Phone: [phone]  [facility] [place]: Offers FREE recovery skills classes, support groups, 1:1 Peer Support, and Compeer Classes. [address] [place], [place] [address] Phone: [phone] (Call to complete intake).  [facility] [address] [place], [place] [address] Phone: [phone] ext: [phone] The [facility] provides food, shelter and other programs and services to the homeless men of [place] through our [employer].  By offering safe shelter, three meals a day, clean clothing, Biblical counseling, financial planning, vocational training, GED/education and employment assistance, we've helped mend the shattered lives of many homeless men since opening in 1974.  We have approximately 267 beds available, with a max of 312 beds including mats for emergency situations and currently house an average of 270 men a night.  Prospective Client Check-In  Information Photo ID Required (State/ Out of State/ [facility]) - if photo ID is not available, clients are required to have a printout of a police/sheriff's criminal history report. Help out with chores around the [facility]. No sex offender of any type (pending, charged, registered and/or any other sex related offenses) will be permitted to check in. Must be willing to abide by all rules, regulations, and policies established by the [employer]. The following will be provided - shelter, food, clothing, and biblical counseling. If you or someone you know is in need of assistance at our [facility] shelter in [place], [place], please call [phone] ext. [phone].  [facility]-will provide timely access to mental health services for children and adolescents (4-17) and adults presenting in a mental health crisis. The program is designed for those who need urgent Behavioral Health or Substance Use treatment and are not experiencing a medical crisis that would typically require an emergency room visit.    [address] [place], [place] [address] Phone: [phone] [facility]  [facility]: Phone#: [phone]  The [facility] SA Intensive Outpatient Program (SAIOP) means structured individual and group addiction activities and services that are provided at an outpatient program designed to assist adult and adolescent consumers to begin recovery and learn skills for recovery maintenance. The [facility] SAIOP program is offered at least 3 hours a day, 3 days a week.SAIOP services shall include a structured program consisting of, but not limited to, the following services: Individual counseling and support; Group counseling and support; Family counseling, training or support; Biochemical assays  to identify recent drug use (e.g., urine drug screens); Strategies for relapse prevention to include community and social support systems in  treatment; Life skills; Crisis contingency planning; Disease Management; and Treatment support activities that have been adapted or specifically designed for persons with physical disabilities, or persons with co-occurring disorders of mental illness and substance abuse/dependence or mental retardation/developmental disability and substance abuse/dependence. Phone: [phone]  Address:   The [facility] will also offer the following outpatient services: (Monday through Friday 8am-5pm)   Partial Hospitalization Program (PHP) Substance Abuse Intensive Outpatient Program (SA-IOP) Group Therapy Medication Management Peer Living Room We also provide (24/7):  Assessments: Our mental health clinician and providers will conduct a focused mental health evaluation, assessing for immediate safety concerns and further mental health needs. Referral: Our team will provide resources and help connect to community based mental health treatment, when indicated, including psychotherapy, psychiatry, and other specialized behavioral health or substance use disorder services (for those not already in treatment). Transitional Care: Our team providers in person bridging and/or telephonic follow-up during the patient's transition to outpatient services.  The [facility] 24-Hour Call Center: [phone] Behavioral Health Crisis Line: [phone]     Writer arranged for the patient to receive a taxi cab voucher so that he can go to his schedule court date appearance today at 10am

## 2023-04-19 NOTE — Progress Notes (Signed)
   04/19/23 2318  [facility] Triage Screening (Walk-ins at [facility] only)  How Did You Hear About [place]? Self  What Is the Reason for Your Visit/Call Today? Pt [facility] to [facility] voluntarily, unaccompanied due to anxiety and paranoia. Pt was seen at [facility] recently for similar presentation. Pt is oberved breathing very hard, scanning the room and stated several times that he is scared. Pt also reports he is hearing sounds, but unable to describe what he is hearing. Pt reports using cocaine and meth. Pt reports he last used about a week ago.Pt states he has a history of mental illness diagnosed w/ MDD and bad anxiety. Pt is prescribed Hydroxyzine and Lexapro. Pt currently denies SI,HI and substance/alcohol use.  Have You Recently Had Any Thoughts About Hurting Yourself? No  Are You Planning to Commit Suicide/Harm Yourself At This time? No  Have you Recently Had Thoughts About Hurting Someone [name]? No  Are You Planning To Harm Someone At This Time? No  Explanation: pt denies  Are you currently experiencing any auditory, visual or other hallucinations? Yes  Please explain the hallucinations you are currently experiencing: unable to describe what he his hearing  Have You Used Any Alcohol or Drugs in the Past 24 Hours? No (pt denies, however recent encounter states he has used within the last couple of days)  Do you have any current medical co-morbidities that require immediate attention? No  Clinician description of patient physical appearance/behavior: breathing very hard, appears paranoid  What Do You Feel Would Help You the Most Today? Treatment for Depression or other mood problem;Medication(s)  If access to [facility] was not available, would you have sought care in the Emergency Department? Yes  Determination of Need Urgent (48 hours)  Options For Referral Other: Comment;BH Urgent Care;Medication Management;Outpatient Therapy;Facility-Based Crisis;Inpatient Hospitalization

## 2023-04-19 NOTE — ED Notes (Signed)
Patient was provided breakfast

## 2023-04-19 NOTE — ED Notes (Signed)
Patient at nursing station, requesting to be discharged today. States he has court tomorrow and doesn't want to miss it. Pt advised that when the providers arrive I will make them aware. Asked if he would be willing to stay knowing that being in rehab would excuse him and he stated no. States he feels better and would like to leave but asked if he could come back to finish detoxing. Patient is calm and cooperative. He denies SI/HI/AVH. Patient reports sleep was "ok" last night. Woke at about 2 am. Believes this was due to sleeping all day yesterday. Patient denies any needs at this time besides wanting to discharge. Will continue to monitor for safety.

## 2023-04-19 NOTE — ED Notes (Addendum)
Patient is his room lying on the bed. Respirations equal and unlabored, skin warm and dry. No change in assessment or acuity. Routine safety checks conducted according to facility protocol. Will continue to monitor for safety.

## 2023-04-19 NOTE — ED Provider Notes (Signed)
[facility] Urgent Care Continuous Assessment Admission H&P  Date: 04/20/23 Patient Name: Joseph [name] MRN: [id number] Chief Complaint: increase anxiety with panic attach  Diagnoses:  Final diagnoses:  Alcohol abuse with intoxication (HCC)  Homelessness  Polysubstance abuse [facility])    HPI: Joseph [name], 27 y/o male with a history of polysubstance abuse, alcohol abuse, malingering, MDD, methamphetamines use.  Presented to [facility] voluntarily.  Per the patient he wants to continue his detox.  Per the patient he thought he had court this morning so he left, according to the patient he had 5-6 beers today.  Patient reports increased anxiety and reported that he also had a panic attack.  Review of patient records show that patient may be malingering, patient is also homeless at the moment.  And per the patient he has been homeless for a couple years.  Per the patient his family lives in [place] but he does have 1 and 2 family members in the [place] area.  See prior admission notes:  Face-to-face observation of patient, patient is alert and oriented x 4, speech is clear, however soft spoken.  Patient reports he had increased anxiety and panic attack this afternoon.  Patient denies SI, HI.  Does seem to be paranoid and thinks that people are following him.  According to patient he last used cocaine and methamphetamines a week ago.  Per the patient he would like to continue treatment,  pt does not seem to be influence by internal or external stimuli.   Discus with patient the need for compliance to treatment.  Pt in agreement with plan of care      Recommend inpatient observation   Total Time spent with patient: 30 minutes  Musculoskeletal  Strength & Muscle Tone: within normal limits Gait & Station: normal Patient leans: N/A  Psychiatric Specialty Exam  Presentation General Appearance:  Casual  Eye Contact: Fair  Speech: Clear and Coherent  Speech  Volume: Decreased  Handedness: Right   Mood and Affect  Mood: Anxious  Affect: Constricted   Thought Process  Thought Processes: Coherent  Descriptions of Associations:Intact  Orientation:Full (Time, Place and Person)  Thought Content:WDL  Diagnosis of Schizophrenia or Schizoaffective disorder in past: No   Hallucinations:Hallucinations: None  Ideas of Reference:None  Suicidal Thoughts:Suicidal Thoughts: No SI Passive Intent and/or Plan: Without Intent; Without Plan  Homicidal Thoughts:Homicidal Thoughts: No   Sensorium  Memory: Immediate Fair  Judgment: Poor  Insight: Fair   Chartered certified accountant: Fair  Attention Span: Good  Recall: Good  Fund of Knowledge: Good  Language: Good   Psychomotor Activity  Psychomotor Activity: Psychomotor Activity: Normal   Assets  Assets: Desire for Improvement; Housing; Resilience   Sleep  Sleep: Sleep: Poor Number of Hours of Sleep: 4   Nutritional Assessment (For OBS and FBC admissions only) Has the patient had a weight loss or gain of 10 pounds or more in the last 3 months?: No Has the patient had a decrease in food intake/or appetite?: No Does the patient have dental problems?: No Does the patient have eating habits or behaviors that may be indicators of an eating disorder including binging or inducing vomiting?: No Has the patient recently lost weight without trying?: 0 Has the patient been eating poorly because of a decreased appetite?: 0 Malnutrition Screening Tool Score: 0    Physical Exam HENT:     Head: Normocephalic.     Nose: Nose normal.  Cardiovascular:     Rate and Rhythm: Normal rate.  Musculoskeletal:        General: Normal range of motion.     Cervical back: Normal range of motion.  Neurological:     General: No focal deficit present.     Mental Status: He is alert.  Psychiatric:        Mood and Affect: Mood normal.        Behavior: Behavior normal.         Thought Content: Thought content normal.        Judgment: Judgment normal.    Review of Systems  Constitutional: Negative.   HENT: Negative.    Eyes: Negative.   Respiratory: Negative.    Cardiovascular: Negative.   Gastrointestinal: Negative.   Genitourinary: Negative.   Musculoskeletal: Negative.   Skin: Negative.   Neurological: Negative.   Psychiatric/Behavioral:  Positive for substance abuse. The patient is nervous/anxious.     Blood pressure (!) 146/70, pulse 94, temperature 97.9 F (36.6 C), temperature source Oral, resp. rate 18, SpO2 100%. There is no height or weight on file to calculate BMI.  Past Psychiatric History: MDD, Alcohol abuse,  polysubstance abuse,    Is the patient at risk to self? No  Has the patient been a risk to self in the past 6 months? No .    Has the patient been a risk to self within the distant past? No   Is the patient a risk to others? No   Has the patient been a risk to others in the past 6 months? No   Has the patient been a risk to others within the distant past? No   Past Medical History: see chart   Family History: unknown  Social History: polysubstance abuse,  ETOH   Last Labs:  Admission on 04/19/2023  Component Date Value Ref Range Status   POC Amphetamine UR 04/20/2023 Positive (A)  NONE DETECTED (Cut Off Level 1000 ng/mL) Preliminary   POC Secobarbital (BAR) 04/20/2023 None Detected  NONE DETECTED (Cut Off Level 300 ng/mL) Preliminary   POC Buprenorphine (BUP) 04/20/2023 None Detected  NONE DETECTED (Cut Off Level 10 ng/mL) Preliminary   POC Oxazepam (BZO) 04/20/2023 Positive (A)  NONE DETECTED (Cut Off Level 300 ng/mL) Preliminary   POC Cocaine UR 04/20/2023 Positive (A)  NONE DETECTED (Cut Off Level 300 ng/mL) Preliminary   POC Methamphetamine UR 04/20/2023 Positive (A)  NONE DETECTED (Cut Off Level 1000 ng/mL) Preliminary   POC Morphine 04/20/2023 None Detected  NONE DETECTED (Cut Off Level 300 ng/mL) Preliminary    POC Methadone UR 04/20/2023 None Detected  NONE DETECTED (Cut Off Level 300 ng/mL) Preliminary   POC Oxycodone UR 04/20/2023 None Detected  NONE DETECTED (Cut Off Level 100 ng/mL) Preliminary   POC Marijuana UR 04/20/2023 None Detected  NONE DETECTED (Cut Off Level 50 ng/mL) Preliminary  Admission on 04/17/2023, Discharged on 04/18/2023  Component Date Value Ref Range Status   WBC 04/17/2023 6.1  4.0 - 10.5 K/uL Final   RBC 04/17/2023 4.97  4.22 - 5.81 MIL/uL Final   Hemoglobin 04/17/2023 15.2  13.0 - 17.0 g/dL Final   HCT 81/19/1478 44.6  39.0 - 52.0 % Final   MCV 04/17/2023 89.7  80.0 - 100.0 fL Final   MCH 04/17/2023 30.6  26.0 - 34.0 pg Final   MCHC 04/17/2023 34.1  30.0 - 36.0 g/dL Final   RDW 29/56/2130 12.8  11.5 - 15.5 % Final   Platelets 04/17/2023 308  150 - 400 K/uL Final   nRBC 04/17/2023 0.0  0.0 - 0.2 % Final   Neutrophils Relative % 04/17/2023 50  % Final   Neutro Abs 04/17/2023 3.1  1.7 - 7.7 K/uL Final   Lymphocytes Relative 04/17/2023 42  % Final   Lymphs Abs 04/17/2023 2.5  0.7 - 4.0 K/uL Final   Monocytes Relative 04/17/2023 6  % Final   Monocytes Absolute 04/17/2023 0.4  0.1 - 1.0 K/uL Final   Eosinophils Relative 04/17/2023 1  % Final   Eosinophils Absolute 04/17/2023 0.0  0.0 - 0.5 K/uL Final   Basophils Relative 04/17/2023 1  % Final   Basophils Absolute 04/17/2023 0.0  0.0 - 0.1 K/uL Final   Immature Granulocytes 04/17/2023 0  % Final   Abs Immature Granulocytes 04/17/2023 0.02  0.00 - 0.07 K/uL Final   Performed at [facility], [address] [place], [place] [address]   Sodium 04/17/2023 138  135 - 145 mmol/L Final   Potassium 04/17/2023 4.0  3.5 - 5.1 mmol/L Final   HEMOLYSIS AT THIS LEVEL MAY AFFECT RESULT   Chloride 04/17/2023 105  98 - 111 mmol/L Final   CO2 04/17/2023 24  22 - 32 mmol/L Final   Glucose, Bld 04/17/2023 127 (H)  70 - 99 mg/dL Final   Glucose reference range applies only to samples taken after fasting for at least 8 hours.   BUN  04/17/2023 9  6 - 20 mg/dL Final   Creatinine, Ser 04/17/2023 1.14  0.61 - 1.24 mg/dL Final   Calcium 60/45/4098 9.0  8.9 - 10.3 mg/dL Final   Total Protein 11/91/4782 6.8  6.5 - 8.1 g/dL Final   Albumin 95/62/1308 3.6  3.5 - 5.0 g/dL Final   AST 65/78/4696 49 (H)  15 - 41 U/L Final   HEMOLYSIS AT THIS LEVEL MAY AFFECT RESULT   ALT 04/17/2023 31  0 - 44 U/L Final   HEMOLYSIS AT THIS LEVEL MAY AFFECT RESULT   Alkaline Phosphatase 04/17/2023 58  38 - 126 U/L Final   Total Bilirubin 04/17/2023 0.7  0.3 - 1.2 mg/dL Final   HEMOLYSIS AT THIS LEVEL MAY AFFECT RESULT   GFR, Estimated 04/17/2023 >60  >60 mL/min Final   Comment: (NOTE) Calculated using the CKD-EPI Creatinine Equation (2021)    Anion gap 04/17/2023 9  5 - 15 Final   Performed at [facility], [address], [place], [place] [address]   Hgb A1c MFr Bld 04/17/2023 5.7 (H)  4.8 - 5.6 % Final   Comment: (NOTE) Pre diabetes:          5.7%-6.4%  Diabetes:              >6.4%  Glycemic control for   <7.0% adults with diabetes    Mean Plasma Glucose 04/17/2023 116.89  mg/dL Final   Performed at [facility], [address] [place], [place] [address]   Magnesium 04/17/2023 2.0  1.7 - 2.4 mg/dL Final   Performed at [facility], [address], [place], [place] [address]   Alcohol, Ethyl (B) 04/17/2023 <10  <10 mg/dL Final   Comment: (NOTE) Lowest detectable limit for serum alcohol is 10 mg/dL.  For medical purposes only. Performed at [facility], [address], [place], [place] [address]    RPR Ser Ql 04/17/2023 NON REACTIVE  NON REACTIVE Final   Performed at [facility], [address], [place], [place] [address]   Color, Urine 04/17/2023 AMBER (A)  YELLOW Final  BIOCHEMICALS MAY BE AFFECTED BY COLOR   APPearance 04/17/2023 HAZY (A)  CLEAR Final   Specific Gravity, Urine 04/17/2023 1.032 (H)  1.005 - 1.030 Final   pH 04/17/2023 5.0  5.0 - 8.0 Final   Glucose, UA 04/17/2023 NEGATIVE   NEGATIVE mg/dL Final   Hgb urine dipstick 04/17/2023 NEGATIVE  NEGATIVE Final   Bilirubin Urine 04/17/2023 NEGATIVE  NEGATIVE Final   Ketones, ur 04/17/2023 5 (A)  NEGATIVE mg/dL Final   Protein, ur 40/98/1191 30 (A)  NEGATIVE mg/dL Final   Nitrite 47/82/9562 NEGATIVE  NEGATIVE Final   Leukocytes,Ua 04/17/2023 NEGATIVE  NEGATIVE Final   RBC / HPF 04/17/2023 0-5  0 - 5 RBC/hpf Final   WBC, UA 04/17/2023 0-5  0 - 5 WBC/hpf Final   Bacteria, UA 04/17/2023 RARE (A)  NONE SEEN Final   Squamous Epithelial / HPF 04/17/2023 0-5  0 - 5 /HPF Final   Mucus 04/17/2023 PRESENT   Final   Ca Oxalate Crys, UA 04/17/2023 PRESENT   Final   Performed at [facility], [address] [place], [place] [address]   POC Amphetamine UR 04/17/2023 None Detected  NONE DETECTED (Cut Off Level 1000 ng/mL) Final   POC Secobarbital (BAR) 04/17/2023 None Detected  NONE DETECTED (Cut Off Level 300 ng/mL) Final   POC Buprenorphine (BUP) 04/17/2023 None Detected  NONE DETECTED (Cut Off Level 10 ng/mL) Final   POC Oxazepam (BZO) 04/17/2023 Positive (A)  NONE DETECTED (Cut Off Level 300 ng/mL) Final   POC Cocaine UR 04/17/2023 Positive (A)  NONE DETECTED (Cut Off Level 300 ng/mL) Final   POC Methamphetamine UR 04/17/2023 Positive (A)  NONE DETECTED (Cut Off Level 1000 ng/mL) Final   POC Morphine 04/17/2023 None Detected  NONE DETECTED (Cut Off Level 300 ng/mL) Final   POC Methadone UR 04/17/2023 None Detected (A)  NONE DETECTED (Cut Off Level 300 ng/mL) Final   POC Oxycodone UR 04/17/2023 None Detected  NONE DETECTED (Cut Off Level 100 ng/mL) Final   POC Marijuana UR 04/17/2023 Positive (A)  NONE DETECTED (Cut Off Level 50 ng/mL) Final   Cholesterol 04/17/2023 122  0 - 200 mg/dL Final   Triglycerides 57/84/6962 46  <150 mg/dL Final   HDL 95/28/4132 49  >40 mg/dL Final   Total CHOL/HDL Ratio 04/17/2023 2.5  RATIO Final   VLDL 04/17/2023 9  0 - 40 mg/dL Final   LDL Cholesterol 04/17/2023 64  0 - 99 mg/dL Final    Comment:        Total Cholesterol/HDL:CHD Risk Coronary Heart Disease Risk Table                     Men   Women  1/2 Average Risk   3.4   3.3  Average Risk       5.0   4.4  2 X Average Risk   9.6   7.1  3 X Average Risk  23.4   11.0        Use the calculated Patient Ratio above and the CHD Risk Table to determine the patient's CHD Risk.        ATP III CLASSIFICATION (LDL):  <100     mg/dL   Optimal  440-102  mg/dL   Near or Above                    Optimal  130-159  mg/dL   Borderline  725-366  mg/dL   High  >440  mg/dL   Very High Performed at [facility], [address] [place], [place] [address]    TSH 04/17/2023 0.426  0.350 - 4.500 uIU/mL Final   Comment: Performed by a 3rd Generation assay with a functional sensitivity of <=0.01 uIU/mL. Performed at [facility], [address], [place], [place] [address]     Allergies: Patient has no known allergies.  Medications:  Facility Ordered Medications  Medication   acetaminophen (TYLENOL) tablet 650 mg   alum & mag hydroxide-simeth (MAALOX/MYLANTA) 200-200-20 MG/5ML suspension 30 mL   magnesium hydroxide (MILK OF MAGNESIA) suspension 30 mL   OLANZapine zydis (ZYPREXA) disintegrating tablet 10 mg   And   [COMPLETED] LORazepam (ATIVAN) tablet 1 mg   And   ziprasidone (GEODON) injection 20 mg   PTA Medications  Medication Sig   escitalopram (LEXAPRO) 10 MG tablet Take 1 tablet (10 mg total) by mouth daily.   naloxone (NARCAN) 0.4 MG/ML injection Inject 1 mL (0.4 mg total) into the muscle as needed.   nicotine (NICODERM CQ - DOSED IN MG/24 HR) 7 mg/24hr patch Place 1 patch (7 mg total) onto the skin daily as needed (prn).      Medical Decision Making  Inpatient observation   Lab Orders         POCT Urine Drug Screen - (I-Screen)     Meds ordered this encounter  Medications   acetaminophen (TYLENOL) tablet 650 mg   alum & mag hydroxide-simeth (MAALOX/MYLANTA) 200-200-20 MG/5ML suspension 30 mL    magnesium hydroxide (MILK OF MAGNESIA) suspension 30 mL   AND Linked Order Group    OLANZapine zydis (ZYPREXA) disintegrating tablet 10 mg    LORazepam (ATIVAN) tablet 1 mg    ziprasidone (GEODON) injection 20 mg     Recommendations  Based on my evaluation the patient does not appear to have an emergency medical condition.  [name], NP 04/20/23  5:36 AM

## 2023-04-19 NOTE — ED Notes (Addendum)
Joseph [name] expressed that he wanted to leave due to not wanting to miss a court date. He stated that he would come back. That he originally came because of anxiety attacks but he is feeling better now.

## 2023-04-19 NOTE — ED Notes (Signed)
Pt A&O X 4, ambulatory. Discharged in no acute distress. Denies SI/HI/AVH upon discharge. Parents verbalized understanding of discharge paperwork and follow up appointments. Pt escorted to lobby via staff for transport home via cab. Safety maintained

## 2023-04-19 NOTE — ED Notes (Signed)
Pt observed at the nurses station. Is calm and cooperative preparing for discharge. Pt denies SI/HI/ or AVH. He states he has court today but originally thought it was tomorrow at 10. Pt is in no acute distress.  Cab has been called. Will continue to monitor for safety.

## 2023-04-19 NOTE — ED Notes (Signed)
Pt is requesting to be discharged.

## 2023-04-20 ENCOUNTER — Other Ambulatory Visit: Payer: Self-pay

## 2023-04-20 DIAGNOSIS — F32A Depression, unspecified: Secondary | ICD-10-CM | POA: Diagnosis not present

## 2023-04-20 DIAGNOSIS — F10129 Alcohol abuse with intoxication, unspecified: Secondary | ICD-10-CM | POA: Diagnosis not present

## 2023-04-20 DIAGNOSIS — F17210 Nicotine dependence, cigarettes, uncomplicated: Secondary | ICD-10-CM | POA: Diagnosis not present

## 2023-04-20 DIAGNOSIS — F19159 Other psychoactive substance abuse with psychoactive substance-induced psychotic disorder, unspecified: Secondary | ICD-10-CM | POA: Diagnosis not present

## 2023-04-20 NOTE — ED Notes (Signed)
Pt sleeping at present, no distress noted.  Monitoring for safety. 

## 2023-04-20 NOTE — ED Notes (Signed)
Patient has denied SI/HI and AVH. Patient reports he has court today and would like to be discharged. Patient reports there was a mix up yesterday with the court date. Patient has been issued a cab voucher and will be discharged today by provider. Patient is currently being monitored for safety.

## 2023-04-20 NOTE — ED Notes (Signed)
Pt A&O x 4, presents with complaint o hearing sounds, pt using cocaine & meth.  Denies SI, HI.  Complaint of depression.  Monitoring for safety.
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
## 2023-04-20 NOTE — ED Notes (Signed)
Patient was discharged to home by provider. Patient reports he has court this morning. Patient was given a cab voucher for transportation to the court house. Patient was given an AVS with community resources.
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
## 2023-04-20 NOTE — Discharge Instructions (Signed)
..Based on the information that you have provided and the presenting issues outpatient services and resources for have been recommended.  It is imperative that you follow through with treatment recommendations within 5-7 days from the of discharge to mitigate further risk to your safety and mental well-being. A list of referrals has been provided below to get you started.  You are not limited to the list provided.  In case of an urgent crisis, you may contact the Mobile Crisis Unit with [facility] at [phone]. t  .. OBS Care Management   Base on the information you have provided and the presenting issue, outpatient services and resources for have been recommended.  It is imperative that you follow through with treatment recommendations within 5-7 days from the of discharge to mitigate further risk to your safety and mental well-being. A list of referrals has been provided below to get you started.  You are not limited to the list provided.  In case of an urgent crisis, you may contact the Mobile Crisis Unit with [facility] at [phone].      Residential Treatment  Facilities Medicaid Detox No Insurance Engineer, site ([facility]) [address] [place], [place] [phone] or [phone]   No  Yes  Yes  Yes   [facility] [phone] [place], [place] [address] [phone] Admissions: 8am-3pm  M-F   [place] only  No  Yes  No    [facility] [phone]   No  Yes  No- out of pocket 16,000  Yes   [facility] ([facility]idential Treatment Services) [address] [place], [place] [phone]   Yes- No medicare  Yes   Yes, [place]   [address] only    [address] [place], [place], [address] [phone]   No  No  Yes but private pay, offers some sponsorships  Does not take insurance    [facility]  [place], [place] [phone]       No  No  Yes- Shelly Coss and Loann Quill out of pocket if not in those counties. 3,220.00 for 28 days.   No   Residential Treatment  Facilities   Medicaid  Detox  No IT trainer (multiple locations throughout the country)  Intake: [phone]    No   Yes   No   Yes   [facility]  [facility] [place], [place] [phone] (takes everyone as long as they meet detox criteria)   Yes  Yes   Yes  Yes   [address] [place], [place]  [phone] 27 locations    No  No- sober living house  90.00-130.00 per week per person  [facility] Part of [place] Outreach [phone] [email or web address] .Lorayne Marek [facility] Part of [place] Outreach [phone] [email or web address]    No   [facility] [address]  [place] [phone] [email or web address]     No  No  1,200.00 a 200.00 deposit is required at start of treatment Payment plans accepted Ephriam Knuckles based program  No   Residential Treatment  Facilities   Medicaid  Detox  No [facility] [address]  [place], [place], [address] [phone]  No  Yes       Yes  Regular Rehab: 7,500.00 28 days Dual Diagnosis: 8,900.00 28 days 7 day detox:  2.700.00 or 3,400.00 for Dual Diagnosis   Yes   Outpatient Treatment  Facilities Medicaid Detox No [facility] [address]  [place], [place], [address] [phone]   No  No  No  Yes    [facility] and Partial Hospitalization Program  (If substance abuse is secondary diagnosis) [address],  [place], [place] [address] [phone]   Yes-[place] and [place] Only for Partial   No   No   Yes- IOP  [facility]  [address] [place] [place],  [place] [phone] (offers adult AND adolescent Intensive Outpatient services) (also in [place], [place], [place] and [place])      No No IOP- does some sponsorships on individual basis Yes   Outpatient Treatment  Facilities Medicaid Detox No [facility] [address]  [place], [place], [address] [phone]   Yes  They would go to ER at [facility] then be transferred to a detox unit    Yes- self pay     Yes    [facility] [address]  [place], [place], [address] And  [address],  [place], [place] [address] [phone]   Yes  No    Yes most qualify for state funding IOP and Opiod treatment- Offers Methadone  [employer], [place], [place]   [facility]  [phone]   No  Yes (in residential treatment program)   No   Insurance Only    [facility] [address] [place], [place],  [phone]   Yes but not for suboxone treatment  Yes- opiates with suboxone have to commit to 8 week IOP   Yes 595.00 for first visit  150.00 for prescription 150.00 a week after that for group    Yes   [facility] [address]  [place], [place] [phone]  No- has a waiting list about to be approved No Yes- but has to be self pay  500.00 for 1st 2 weeks  500 for next 2 weeks and  750 mo. Ongoing Yes   Outpatient Treatment  Facilities Medicaid Detox No Insurance [facility]   [facility] Program [phone] [place], [place] [phone]  No No Limited sponsorships IOP- 9,500.00 8-15 weeks If paid upfront gives a 500.00 deduction Outpatient- 1 day a week  9 weeks 4,500.00 has payment plans   Yes- out of network though   [facility] (Groups/Residential) [place], [place]  [phone]  Yes- [place] No IOP facility  Yes  No   [facility]  [address] [place], [place] [phone]  No No Out of  pocket only- depends on the situation  Allow people to do services on a flexible  payment plan- billed every 90 days based on income  35.00 per group- 2 hour session  *DUI assessments  and *education for charges  *evaluations   No  Outpatient Treatment  Facilities Medicaid Detox No Insurance Private  Insurance   [facility]  [address], [place], [address] [phone]   Yes  No  Yes  Yes    Mobile Crisis: [facility]: [phone]  (For crisis response 24 hours a day) [facility] Hotline: [phone]    .Marland Kitchen  [facility] [address], [place], [address] [phone] phone  New Patient Assessment/Therapy Walk-Ins:  Monday and Wednesday: 8 am until slots are full. Every 1st and 2nd Fridays of the month: 1 pm - 5 pm.  NO ASSESSMENT/THERAPY WALK-INS ON TUESDAYS OR THURSDAYS  New Patient Assessment/Medication Management Walk-Ins:  Monday - Friday:  8 am - 11 am.  For all walk-ins, we ask that you arrive by 7:30 am because patients will be seen in the order of arrival.  Availability is limited; therefore, you may not be seen on the same day that you walk-in.  Our goal is to serve and meet the needs of our community to the best of our ability.

## 2023-04-20 NOTE — Care Management (Signed)
Care Management   Writer met with the patient and discussed his discharge planning.  Patient reports that there was a mix up and his court date is today.   Writer attempted to contact his probation officer at [phone] (Mrs. Okey Dupre),  Writer provided the patient with a letter to give to his probation officer informing her that he was at the [facility].    Patient requests to be discharged and will follow up with outpatient resources.    Writer discussed the discharge plan with the MD.

## 2023-04-21 ENCOUNTER — Ambulatory Visit (HOSPITAL_COMMUNITY)
Admission: EM | Admit: 2023-04-21 | Discharge: 2023-04-21 | Disposition: A | Discharge status: Home / Self Care | Payer: No Typology Code available for payment source

## 2023-04-21 DIAGNOSIS — F1910 Other psychoactive substance abuse, uncomplicated: Secondary | ICD-10-CM

## 2023-04-21 DIAGNOSIS — F1510 Other stimulant abuse, uncomplicated: Secondary | ICD-10-CM

## 2023-04-21 DIAGNOSIS — Z5900 Homelessness unspecified: Secondary | ICD-10-CM | POA: Diagnosis not present

## 2023-04-21 DIAGNOSIS — Z765 Malingerer [conscious simulation]: Secondary | ICD-10-CM | POA: Diagnosis not present
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
## 2023-04-21 NOTE — Discharge Instructions (Signed)

## 2023-04-21 NOTE — Progress Notes (Signed)
   04/21/23 2148  [facility] Triage Screening (Walk-ins at [facility] only)  What Is the Reason for Your Visit/Call Today? Pt presents to [facility] voluntarily, unaccompanied seeking detox. Patient was seen and discharged this evening and has returned requesting to be seen again. Patient reports he is depressed and needs detox from drugs and alcohol. Patient reports using meth earlier today. Patient reports using cocaine and marijuana "a few days ago". Patient reports "my depression is getting worse". Patient denied SI, HI and psychosis. Please see prior triage notes on patient.  How Long Has This Been Causing You Problems? 1 wk - 1 month  Have You Recently Had Any Thoughts About Hurting Yourself? No  Are You Planning to Commit Suicide/Harm Yourself At This time? No  Have you Recently Had Thoughts About Hurting Someone [name]? No  Are You Planning To Harm Someone At This Time? No  Explanation: n/a  Are you currently experiencing any auditory, visual or other hallucinations? No  Please explain the hallucinations you are currently experiencing: n/a  Have You Used Any Alcohol or Drugs in the Past 24 Hours? Yes  How long ago did you use Drugs or Alcohol? meth  What Did You Use and How Much? unknown per patient  Do you have any current medical co-morbidities that require immediate attention? No  Clinician description of patient physical appearance/behavior: neat / cooperative  What Do You Feel Would Help You the Most Today? Alcohol or Drug Use Treatment;Treatment for Depression or other mood problem  If access to [facility] Urgent Care was not available, would you have sought care in the Emergency Department? Yes  Determination of Need Routine (7 days)  Options For Referral Medication Management;Outpatient Therapy;Chemical Dependency Intensive Outpatient Therapy (CDIOP)    Flowsheet Row ED from 04/21/2023 in [facility] Most recent reading at 04/21/2023  9:54 PM ED from 04/21/2023 in [facility] Most recent reading at 04/21/2023  6:56 PM ED from 04/19/2023 in [facility] Most recent reading at 04/20/2023 10:47 AM  C-SSRS RISK CATEGORY No Risk No Risk No Risk

## 2023-04-21 NOTE — Progress Notes (Signed)
   04/21/23 1852  [facility] Triage Screening (Walk-ins at [facility] only)  How Did You Hear About [place]? Self  What Is the Reason for Your Visit/Call Today? Pt presents to [facility] voluntarily, unaccompanied seeking detox. Pt was recently admitted to [facility] on 8/5 and per chart review Dr.Bahraini reports "Patient requested discharge from [facility] on 04/19/23 in order to attend court hearing and was felt appropriate for discharge at that time. However, he returned to [facility] later that evening requesting completion of detox. Notably UDS noted to be newly positive for amphetamine although patient denies any substance use outside of etoh use prior to this most recent presentation. As he had completed approx. 3 days of detox with CIWA scores in range of 0-2 during previous admission, denies history of complicated alcohol withdrawal, and is not showing any signs/sx of current withdrawal it is felt that patient does not require admission for detox at this time." Pt reports he used methamphetamine today "a couple of hours ago" an unknown amount. Pt reports using methamphetamine every other day. Pt also reports using alcohol 2 days ago, an unknown amount. He reports drinking alcohol daily, about 5-6, 16oz beers. Pt denies any withdrawal symptoms. Pt reports he is homeless. Pt denies SI/HI and AVH.  How Long Has This Been Causing You Problems? <Week  Have You Recently Had Any Thoughts About Hurting Yourself? No  Are You Planning to Commit Suicide/Harm Yourself At This time? No  Have you Recently Had Thoughts About Hurting Someone [name]? No  Are You Planning To Harm Someone At This Time? No  Are you currently experiencing any auditory, visual or other hallucinations? No  Have You Used Any Alcohol or Drugs in the Past 24 Hours? Yes  How long ago did you use Drugs or Alcohol? today  What Did You Use and How Much? methamphetamine, unknown amount  Do you have any current medical co-morbidities that require immediate attention? No  Clinician  description of patient physical appearance/behavior: calm, cooperative, wearing shorts and black top  What Do You Feel Would Help You the Most Today? Alcohol or Drug Use Treatment  If access to [facility] Urgent Care was not available, would you have sought care in the Emergency Department? No  Determination of Need Urgent (48 hours)  Options For Referral Facility-Based Crisis

## 2023-04-30 ENCOUNTER — Encounter (HOSPITAL_COMMUNITY): Payer: Self-pay

## 2023-04-30 ENCOUNTER — Other Ambulatory Visit: Payer: Self-pay

## 2023-04-30 ENCOUNTER — Emergency Department (HOSPITAL_COMMUNITY)
Admission: EM | Admit: 2023-04-30 | Discharge: 2023-05-01 | Disposition: A | Discharge status: Home / Self Care | Payer: No Typology Code available for payment source | Attending: Emergency Medicine | Admitting: Emergency Medicine

## 2023-04-30 ENCOUNTER — Emergency Department (HOSPITAL_COMMUNITY): Payer: No Typology Code available for payment source

## 2023-04-30 DIAGNOSIS — F22 Delusional disorders: Secondary | ICD-10-CM | POA: Diagnosis present

## 2023-04-30 DIAGNOSIS — F32A Depression, unspecified: Secondary | ICD-10-CM | POA: Diagnosis not present

## 2023-04-30 DIAGNOSIS — F1994 Other psychoactive substance use, unspecified with psychoactive substance-induced mood disorder: Secondary | ICD-10-CM | POA: Diagnosis present

## 2023-04-30 DIAGNOSIS — F19950 Other psychoactive substance use, unspecified with psychoactive substance-induced psychotic disorder with delusions: Secondary | ICD-10-CM | POA: Insufficient documentation

## 2023-04-30 DIAGNOSIS — Z87891 Personal history of nicotine dependence: Secondary | ICD-10-CM | POA: Insufficient documentation

## 2023-04-30 DIAGNOSIS — F19959 Other psychoactive substance use, unspecified with psychoactive substance-induced psychotic disorder, unspecified: Secondary | ICD-10-CM | POA: Diagnosis not present

## 2023-04-30 DIAGNOSIS — F1590 Other stimulant use, unspecified, uncomplicated: Secondary | ICD-10-CM | POA: Insufficient documentation

## 2023-04-30 DIAGNOSIS — F1290 Cannabis use, unspecified, uncomplicated: Secondary | ICD-10-CM | POA: Diagnosis present

## 2023-04-30 LAB — COMPREHENSIVE METABOLIC PANEL
ALT: 23 U/L (ref 0–44)
AST: 28 U/L (ref 15–41)
Albumin: 4.6 g/dL (ref 3.5–5.0)
Alkaline Phosphatase: 67 U/L (ref 38–126)
Anion gap: 11 (ref 5–15)
BUN: 8 mg/dL (ref 6–20)
CO2: 22 mmol/L (ref 22–32)
Calcium: 10 mg/dL (ref 8.9–10.3)
Chloride: 104 mmol/L (ref 98–111)
Creatinine, Ser: 1.15 mg/dL (ref 0.61–1.24)
GFR, Estimated: >60 mL/min (ref >60)
Glucose, Bld: 104 mg/dL — ABNORMAL HIGH (ref 70–99)
Potassium: 3.1 mmol/L — ABNORMAL LOW (ref 3.5–5.1)
Sodium: 137 mmol/L (ref 135–145)
Total Bilirubin: 0.7 mg/dL (ref 0.3–1.2)
Total Protein: 8.6 g/dL — ABNORMAL HIGH (ref 6.5–8.1)

## 2023-04-30 LAB — CBC WITH DIFFERENTIAL/PLATELET
Abs Immature Granulocytes: 0.02 10*3/uL (ref 0.00–0.07)
Basophils Absolute: 0 10*3/uL (ref 0.0–0.1)
Basophils Relative: 0 %
Eosinophils Absolute: 0.2 10*3/uL (ref 0.0–0.5)
Eosinophils Relative: 3 %
HCT: 46.1 % (ref 39.0–52.0)
Hemoglobin: 15.8 g/dL (ref 13.0–17.0)
Immature Granulocytes: 0 %
Lymphocytes Relative: 26 %
Lymphs Abs: 1.9 10*3/uL (ref 0.7–4.0)
MCH: 30 pg (ref 26.0–34.0)
MCHC: 34.3 g/dL (ref 30.0–36.0)
MCV: 87.5 fL (ref 80.0–100.0)
Monocytes Absolute: 0.7 10*3/uL (ref 0.1–1.0)
Monocytes Relative: 10 %
Neutro Abs: 4.3 10*3/uL (ref 1.7–7.7)
Neutrophils Relative %: 61 %
Platelets: 373 10*3/uL (ref 150–400)
RBC: 5.27 MIL/uL (ref 4.22–5.81)
RDW: 13 % (ref 11.5–15.5)
WBC: 7 10*3/uL (ref 4.0–10.5)
nRBC: 0 % (ref 0.0–0.2)

## 2023-04-30 LAB — ACETAMINOPHEN LEVEL: Acetaminophen (Tylenol), Serum: <10 ug/mL — ABNORMAL LOW (ref 10–30)

## 2023-04-30 LAB — SALICYLATE LEVEL: Salicylate Lvl: <7 mg/dL — ABNORMAL LOW (ref 7.0–30.0)

## 2023-04-30 LAB — ETHANOL: Alcohol, Ethyl (B): <10 mg/dL (ref <10)

## 2023-04-30 MED ORDER — ESCITALOPRAM OXALATE 10 MG PO TABS
10.0000 mg | ORAL_TABLET | Freq: Every day | ORAL | Status: DC
  Administered 2023-05-01 (×2): 10 mg via ORAL
  Filled 2023-04-30 (×2): qty 1

## 2023-04-30 MED ORDER — ESCITALOPRAM OXALATE 10 MG PO TABS: 10.0000 mg | ORAL_TABLET | Freq: Every day | ORAL | Status: DC

## 2023-04-30 MED ORDER — HYDROXYZINE HCL 25 MG PO TABS
50.0000 mg | ORAL_TABLET | Freq: Once | ORAL | Status: AC
  Administered 2023-04-30: 50 mg via ORAL
  Filled 2023-04-30: qty 2

## 2023-04-30 NOTE — ED Notes (Signed)
Joseph [name] during initial assessment stated that he had consumed an unknown amount of methaphetamines today but could not provide an amount of exact time he took it. Affect is flat mood is labile and very paranoid. Pt walking with a shuffling steady gate appears very paranoid and appears to be responding to internal stimuli and is requesting something for anxiety.Will request to give lexapro with atarax now from [facility] NP.

## 2023-04-30 NOTE — ED Triage Notes (Signed)
Pt. BIB [facility] for paranoia. Pt. Called EMS for SOB. Per EMS, pt. Was having an anxiety attack. Pt. Has used ETOH.

## 2023-04-30 NOTE — ED Provider Notes (Signed)
[place] EMERGENCY DEPARTMENT AT [facility] Provider Note   CSN: [id number] Arrival date & time: 04/30/23  2145     History {Add pertinent medical, surgical, social history, OB history to HPI:1} Chief Complaint  Patient presents with   Paranoid    Joseph [name] is a 27 y.o. male.  HPI   Patient with medical history including PTSD, polysubstance use, alcohol use, housing instability, presenting with complaints of paranoia.  Patient states that he woke up today and feels very anxious, states she is afraid to be alone, he denies any SI or HI, he does admit to methamphetamine use, states this only worsened his symptoms, states she has not been taking his psychiatric medication for the last week, he denies any alcohol use.  He denies any hallucinations or delusions, he is not having any other complaints.  Home Medications Prior to Admission medications   Medication Sig Start Date End Date Taking? Authorizing Provider  escitalopram (LEXAPRO) 10 MG tablet Take 1 tablet (10 mg total) by mouth daily. Patient not taking: Reported on 04/20/2023 04/20/23 05/20/23  Augusto Gamble, MD  naloxone [facility]) 0.4 MG/ML injection Inject 1 mL (0.4 mg total) into the muscle as needed. Patient not taking: Reported on 04/20/2023 04/19/23   Augusto Gamble, MD  nicotine (NICODERM CQ - DOSED IN MG/24 HR) 7 mg/24hr patch Place 1 patch (7 mg total) onto the skin daily as needed (prn). Patient not taking: Reported on 04/20/2023 04/19/23 05/19/23  Augusto Gamble, MD      Allergies    Patient has no known allergies.    Review of Systems   Review of Systems  Constitutional:  Negative for chills and fever.  Respiratory:  Negative for shortness of breath.   Cardiovascular:  Negative for chest pain.  Gastrointestinal:  Negative for abdominal pain.  Neurological:  Negative for headaches.  Psychiatric/Behavioral:  The patient is nervous/anxious.     Physical Exam Updated Vital Signs BP 134/86 (BP Location: Left Arm)    Pulse 71   Temp (!) 97.5 F (36.4 C) (Oral)   Resp 18   Ht 5\' 10"  (1.778 m)   Wt 94.3 kg   SpO2 100%   BMI 29.84 kg/m  Physical Exam Vitals and nursing note reviewed.  Constitutional:      General: He is not in acute distress.    Appearance: He is not ill-appearing.  HENT:     Head: Normocephalic and atraumatic.     Nose: No congestion.  Eyes:     Conjunctiva/sclera: Conjunctivae normal.  Cardiovascular:     Rate and Rhythm: Normal rate and regular rhythm.     Pulses: Normal pulses.     Heart sounds: No murmur heard.    No friction rub. No gallop.  Pulmonary:     Effort: Pulmonary effort is normal. No respiratory distress.     Breath sounds: No wheezing.  Musculoskeletal:     Right lower leg: No edema.     Left lower leg: No edema.  Skin:    General: Skin is warm and dry.  Neurological:     Mental Status: He is alert.     Comments: No facial asymmetry no difficulty with word finding, patient is following two-step commands there is no unilateral weakness present present.  Patient does appear to be paranoid, asking someone to  stay in the room with him  Psychiatric:        Mood and Affect: Mood normal.     Comments: Patient denies  SI or HI, appears to be anxious on my exam, does not appear to be respond to internal stimuli, patient is responding appropriately.     ED Results / Procedures / Treatments   Labs (all labs ordered are listed, but only abnormal results are displayed) Labs Reviewed  COMPREHENSIVE METABOLIC PANEL - Abnormal; Notable for the following components:      Result Value   Potassium 3.1 (*)    Glucose, Bld 104 (*)    Total Protein 8.6 (*)    All other components within normal limits  SALICYLATE LEVEL - Abnormal; Notable for the following components:   Salicylate Lvl <7.0 (*)    All other components within normal limits  ACETAMINOPHEN LEVEL - Abnormal; Notable for the following components:   Acetaminophen (Tylenol), Serum <10 (*)    All other  components within normal limits  ETHANOL  CBC WITH DIFFERENTIAL/PLATELET  RAPID URINE DRUG SCREEN, HOSP PERFORMED    EKG None  Radiology No results found.  Procedures Procedures  {Document cardiac monitor, telemetry assessment procedure when appropriate:1}  Medications Ordered in ED Medications - No data to display  ED Course/ Medical Decision Making/ A&P   {   Click here for ABCD2, HEART and other calculatorsREFRESH Note before signing :1}                              Medical Decision Making Amount and/or Complexity of Data Reviewed Labs: ordered.   This patient presents to the ED for concern of paranoia/anxious, this involves an extensive number of treatment options, and is a complaint that carries with it a high risk of complications and morbidity.  The differential diagnosis includes psychiatric emergency, withdrawals, metabolic abnormality, ACS,    Additional history obtained:  Additional history obtained from N/A External records from outside source obtained and reviewed including behavioral health notes   Co morbidities that complicate the patient evaluation  Psychiatric disorder, polysubstance use  Social Determinants of Health:  No primary care provider, housing stability    Lab Tests:  I Ordered, and personally interpreted labs.  The pertinent results include: CBC unremarkable, CMP reveals potassium 3.1, glucose 104, total protein 8.6, ethanol negative, significant for negative, saliclate negative   Imaging Studies ordered:  I ordered imaging studies including chest x-ray I independently visualized and interpreted imaging which showed *** I agree with the radiologist interpretation   Cardiac Monitoring:  The patient was maintained on a cardiac monitor.  I personally viewed and interpreted the cardiac monitored which showed an underlying rhythm of:***   Medicines ordered and prescription drug management:  I ordered medication including  Lexapro, Vistaril I have reviewed the patients home medicines and have made adjustments as needed  Critical Interventions:  ***   Reevaluation:  Presents with anxiety and paranoia, will obtain screening lab workup, also add on cardiac lab workup to exclude possible ACS etiology.    Consultations Obtained:  I requested consultation with the ***,  and discussed lab and imaging findings as well as pertinent plan - they recommend: ***    Test Considered:  ***    Rule out ****    Dispostion and problem list  After consideration of the diagnostic results and the patients response to treatment, I feel that the patent would benefit from ***.       {Document critical care time when appropriate:1} {Document review of labs and clinical decision tools ie heart score, Chads2Vasc2 etc:1}  {Document  your independent review of radiology images, and any outside records:1} {Document your discussion with family members, caretakers, and with consultants:1} {Document social determinants of health affecting pt's care:1} {Document your decision making why or why not admission, treatments were needed:1} Final Clinical Impression(s) / ED Diagnoses Final diagnoses:  None    Rx / DC Orders ED Discharge Orders     None

## 2023-04-30 NOTE — ED Notes (Signed)
Sent urine to the lab with culture

## 2023-04-30 NOTE — ED Notes (Signed)
One bag of patient belongings was placed in the triage cabinet.

## 2023-05-01 ENCOUNTER — Emergency Department (HOSPITAL_COMMUNITY): Payer: No Typology Code available for payment source

## 2023-05-01 ENCOUNTER — Encounter (HOSPITAL_COMMUNITY): Payer: Self-pay | Admitting: Emergency Medicine

## 2023-05-01 DIAGNOSIS — F1994 Other psychoactive substance use, unspecified with psychoactive substance-induced mood disorder: Secondary | ICD-10-CM | POA: Diagnosis not present

## 2023-05-01 DIAGNOSIS — F19959 Other psychoactive substance use, unspecified with psychoactive substance-induced psychotic disorder, unspecified: Secondary | ICD-10-CM

## 2023-05-01 DIAGNOSIS — F19950 Other psychoactive substance use, unspecified with psychoactive substance-induced psychotic disorder with delusions: Secondary | ICD-10-CM | POA: Diagnosis not present

## 2023-05-01 LAB — RAPID URINE DRUG SCREEN, HOSP PERFORMED
Amphetamines: POSITIVE — AB
Barbiturates: NOT DETECTED
Benzodiazepines: NOT DETECTED
Cocaine: NOT DETECTED
Opiates: NOT DETECTED
Tetrahydrocannabinol: POSITIVE — AB

## 2023-05-01 LAB — TROPONIN I (HIGH SENSITIVITY): Troponin I (High Sensitivity): 4 ng/L (ref <18)

## 2023-05-01 MED ORDER — LORAZEPAM 1 MG PO TABS
1.0000 mg | ORAL_TABLET | Freq: Once | ORAL | Status: AC
  Administered 2023-05-01: 1 mg via ORAL
  Filled 2023-05-01: qty 1

## 2023-05-01 MED ORDER — LORAZEPAM 1 MG PO TABS
2.0000 mg | ORAL_TABLET | Freq: Three times a day (TID) | ORAL | Status: DC | PRN
  Administered 2023-05-01: 2 mg via ORAL
  Filled 2023-05-01: qty 2

## 2023-05-01 MED ORDER — LORAZEPAM 1 MG PO TABS: 1.0000 mg | ORAL_TABLET | Freq: Three times a day (TID) | ORAL | Status: DC | PRN

## 2023-05-01 NOTE — ED Notes (Signed)
IVC case number [id number].
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
## 2023-05-01 NOTE — Discharge Summary (Signed)
[facility] Psych ED Discharge  05/01/2023 11:25 AM Dashun Kostelnik  MRN:  [id number]  Principal Problem: Psychoactive substance-induced mood disorder [facility]) Discharge Diagnoses: Principal Problem:   Psychoactive substance-induced mood disorder (HCC) Active Problems:   Cannabis use disorder  Clinical Impression:  Final diagnoses:  Drug-induced intensive care psychosis [facility])   Subjective: AA Male, 27 years old brought in last night to the ER by EMS for paranoia., anxiety and SOB.  Patient was having anxiety attack at that time he said.  Patient was seen last night by [facility] Provider who evaluated and recommended a reevaluation this morning.  Previous hx includes MDD, PTSD, tobacco use d/o, alcohol use d/o with no Withdrawal Seizure hx, cannabis use d/o, cocaine use d/o, methamphetamine use d/o, opioid use d/o, substance induced psychosis.   Patient was seen by this provider and he engaged in meaningful conversation.  Patient denied feeling suicidal or Homicidal,he denied AVH and no mention of Paranoia.  Patient admitted using alcohol, Cannabis and Amphetamine yesterday.  He also reports that he completed substance abuse treatment three Months ago at [facility].  He relapsed few Months later because his friends dragged him back there again to use.  Patient is homeless, unemployed and has no support system.  He is also want to go in for another long term substance abuse treatment at [place] [facility] or [facility].  Information is given to patient to initiate Phone contacts with these facilities. AA male came in last with Paranoia and panic attack after using Amphetamine and Cannabis.  This morning he is awake, alert and denies any symptoms associated with the substances used.  Alcohol level is zero, UDS is positive for Amphetamine and cannabis.  Patient states that he is truly interested in going to a rehabilitation facility.  He denies SI/HI/AVH and no mention of Paranoia.  We reviewed safety plan  -call 911 or [phone] for mental health crisis not limited to suicide ideation.  Go to [facility] health facility for outpatient care and counseling.  Patient verbalizes understanding.  Patient is Psychiatrically cleared.  Information for [facility] given to patient to call and enquire about bed availability.  ED Assessment Time Calculation: Start Time: 1030 Stop Time: 1102 Total Time in Minutes (Assessment Completion): 32   Past Psychiatric History: Polysubstance abuse- Alcohol use d/o, Cocaine use disorder, Amphetamine use Disorder, Cocaine use disorder, MDD, and PTSD.  Patient frequents the ER or [facility] for care.  He was hospitalized at [facility] last years and had a rahab treatment at a facility in [place].  Past Medical History:  Past Medical History:  Diagnosis Date   Psychoactive substance-induced psychosis (HCC) 07/27/2022    Past Surgical History:  Procedure Laterality Date   CYST REMOVAL HAND  06/22/2022   Procedure: Repair left hand laceration;  Surgeon: Victorino Sparrow, MD;  Location: [facility] OR;  Service: Vascular;;   HEMATOMA EVACUATION Left 06/22/2022   Procedure: Guss Bunde;  Surgeon: Victorino Sparrow, MD;  Location: [facility] OR;  Service: Vascular;  Laterality: Left;   LAPAROTOMY N/A 06/22/2022   Procedure: EXPLORATORY LAPAROTOMY PLACEMENT OF LEFT CHEST TUBE. REPAIR OF 7 CENTIMETER RIGHT SCALP LACERATION. REPAIR OF SCALP LACERATION  AT CROWN.;  Surgeon: Gaynelle Adu, MD;  Location: [facility] OR;  Service: General;  Laterality: N/A;   WOUND EXPLORATION Left 06/22/2022   Procedure: Left chest exploration and intercostal artery ligation;  Surgeon: Victorino Sparrow, MD;  Location: [facility] OR;  Service: Vascular;  Laterality:  Left;   Family History: History reviewed. No pertinent family history. Family Psychiatric  History: Denies Social History:  Social History   Substance and Sexual Activity  Alcohol Use Yes   Alcohol/week: 8.0 standard drinks of alcohol   Types: 8  Cans of beer per week     Social History   Substance and Sexual Activity  Drug Use Yes   Types: Benzodiazepines, Marijuana    Social History   Socioeconomic History   Marital status: Single    Spouse name: Not on file   Number of children: Not on file   Years of education: Not on file   Highest education level: Not on file  Occupational History   Not on file  Tobacco Use   Smoking status: Former    Current packs/day: 0.00    Types: Cigarettes    Quit date: 07/15/2022    Years since quitting: 0.7   Smokeless tobacco: Former    Quit date: 07/15/2022  Vaping Use   Vaping status: Never Used  Substance and Sexual Activity   Alcohol use: Yes    Alcohol/week: 8.0 standard drinks of alcohol    Types: 8 Cans of beer per week   Drug use: Yes    Types: Benzodiazepines, Marijuana   Sexual activity: Yes  Other Topics Concern   Not on file  Social History Narrative   ** Merged History Encounter **       Social Determinants of Health   Financial Resource Strain: Not on file  Food Insecurity: No Food Insecurity (04/18/2023)   Hunger Vital Sign    Worried About Running Out of Food in the Last Year: Never true    Ran Out of Food in the Last Year: Never true  Recent Concern: Food Insecurity - Food Insecurity Present (04/17/2023)   Hunger Vital Sign    Worried About Running Out of Food in the Last Year: Sometimes true    Ran Out of Food in the Last Year: Sometimes true  Transportation Needs: No Transportation Needs (04/18/2023)   PRAPARE - Administrator, Civil Service (Medical): No    Lack of Transportation (Non-Medical): No  Recent Concern: Transportation Needs - Unmet Transportation Needs (04/17/2023)   PRAPARE - Administrator, Civil Service (Medical): Yes    Lack of Transportation (Non-Medical): No  Physical Activity: Not on file  Stress: Not on file  Social Connections: Unknown (09/08/2022)   [facility]   Social Network     Social Network: Not on file    Tobacco Cessation:  N/A, patient does not currently use tobacco products  Current Medications: Current Facility-Administered Medications  Medication Dose Route Frequency Provider Last Rate Last Admin   escitalopram (LEXAPRO) tablet 10 mg  10 mg Oral Daily Dione Booze, MD   10 mg at 05/01/23 1022   LORazepam (ATIVAN) tablet 1 mg  1 mg Oral TID PRN Adria Dill, MD       LORazepam (ATIVAN) tablet 2 mg  2 mg Oral TID PRN Adria Dill, MD   2 mg at 05/01/23 0229   Current Outpatient Medications  Medication Sig Dispense Refill   escitalopram (LEXAPRO) 10 MG tablet Take 1 tablet (10 mg total) by mouth daily. (Patient not taking: Reported on 04/20/2023) 30 tablet 0   naloxone (NARCAN) 0.4 MG/ML injection Inject 1 mL (0.4 mg total) into the muscle as needed. (Patient not taking: Reported on 04/20/2023) 1 mL 0   nicotine (  NICODERM CQ - DOSED IN MG/24 HR) 7 mg/24hr patch Place 1 patch (7 mg total) onto the skin daily as needed (prn). (Patient not taking: Reported on 04/20/2023) 30 patch 0   PTA Medications: (Not in a hospital admission)   [place] Scale:  Flowsheet Row ED from 04/30/2023 in [facility] Emergency Department at [facility] Most recent reading at 04/30/2023 10:00 PM ED from 04/21/2023 in [facility] Most recent reading at 04/21/2023  9:54 PM ED from 04/21/2023 in [facility] Most recent reading at 04/21/2023  6:56 PM  C-SSRS RISK CATEGORY No Risk No Risk No Risk       Musculoskeletal: Strength & Muscle Tone: within normal limits Gait & Station: normal Patient leans: Front  Psychiatric Specialty Exam: Presentation  General Appearance:  Casual; Neat  Eye Contact: Good  Speech: Clear and Coherent; Normal Rate  Speech Volume: Normal  Handedness: Right   Mood and Affect  Mood: Euthymic  Affect: Congruent   Thought Process  Thought Processes: Coherent; Goal  Directed  Descriptions of Associations:Intact  Orientation:Full (Time, Place and Person)  Thought Content:Logical  History of Schizophrenia/Schizoaffective disorder:No  Duration of Psychotic Symptoms:N/A  Hallucinations:Hallucinations: None  Ideas of Reference:None  Suicidal Thoughts:Suicidal Thoughts: No  Homicidal Thoughts:Homicidal Thoughts: No   Sensorium  Memory: Immediate Good; Recent Good; Remote Good  Judgment: Good  Insight: Good   Executive Functions  Concentration: Good  Attention Span: Good  Recall: Good  Fund of Knowledge: Good  Language: Good   Psychomotor Activity  Psychomotor Activity: Psychomotor Activity: Normal   Assets  Assets: Communication Skills; Desire for Improvement; Physical Health   Sleep  Sleep: Sleep: Fair    Physical Exam: Physical Exam Vitals and nursing note reviewed.  Constitutional:      Appearance: Normal appearance.  HENT:     Nose: Nose normal.  Cardiovascular:     Rate and Rhythm: Normal rate and regular rhythm.  Pulmonary:     Effort: Pulmonary effort is normal.  Musculoskeletal:        General: Normal range of motion.     Cervical back: Normal range of motion.  Skin:    General: Skin is dry.  Neurological:     General: No focal deficit present.     Mental Status: He is alert and oriented to person, place, and time.  Psychiatric:        Attention and Perception: Attention and perception normal.        Mood and Affect: Mood and affect normal.        Speech: Speech normal.        Behavior: Behavior normal. Behavior is cooperative.        Thought Content: Thought content normal.        Cognition and Memory: Cognition and memory normal.        Judgment: Judgment normal.    Review of Systems  Constitutional: Negative.   HENT: Negative.    Eyes: Negative.   Respiratory: Negative.    Cardiovascular: Negative.   Gastrointestinal: Negative.   Genitourinary: Negative.   Musculoskeletal:  Negative.   Skin: Negative.   Neurological: Negative.   Endo/Heme/Allergies: Negative.   Psychiatric/Behavioral:  Positive for substance abuse. The patient is nervous/anxious.    Blood pressure 116/68, pulse 98, temperature 98.9 F (37.2 C), temperature source Oral, resp. rate 20, height 5\' 10"  (1.778 m), weight 94.3 kg, SpO2 100%. Body mass index is 29.84 kg/m.   Demographic Factors:  Male, Adolescent or young adult, Low socioeconomic status, Unemployed, and homeless.  Loss Factors: Financial problems/change in socioeconomic status  Historical Factors: Impulsivity  Risk Reduction Factors:   NA  Continued Clinical Symptoms:  Alcohol/Substance Abuse/Dependencies More than one psychiatric diagnosis Previous Psychiatric Diagnoses and Treatments  Cognitive Features That Contribute To Risk:  None    Suicide Risk:  Minimal: No identifiable suicidal ideation.  Patients presenting with no risk factors but with morbid ruminations; may be classified as minimal risk based on the severity of the depressive symptoms   Plan Of Care/Follow-up recommendations:  Activity:  as tolerated Diet:  Regular  Medical Decision Making: Patient denies SI/HI/AVH.  He is in need of long term rehabilitation treatment.  We reviewed safety plans, gave him number for [facility] to call for opening for rehab.  Patient is Psychiatrically cleared.  Disposition: Psychiatry Cleared  Earney Navy, NP-PMHNP-BC 05/01/2023, 11:25 AM

## 2023-05-01 NOTE — ED Provider Notes (Signed)
Emergency Medicine Observation Re-evaluation Note  Joseph [name] is a 27 y.o. male, seen on rounds today.  Pt initially presented to the ED for complaints of Anxiety and Meth use Currently, the patient is pending TTS evaluation.  Physical Exam  BP 116/68 (BP Location: Right Arm)   Pulse 98   Temp 98.9 F (37.2 C) (Oral)   Resp 20   Ht 5\' 10"  (1.778 m)   Wt 94.3 kg   SpO2 100%   BMI 29.84 kg/m  Physical Exam General: Calm Cardiac: Well perfused Lungs: Even respirations Psych: Calm  ED Course / MDM  EKG:   I have reviewed the labs performed to date as well as medications administered while in observation.  Recent changes in the last 24 hours include ED observation and TTS re-evaluation.  Plan  Current plan is discharge. TTS re-evaluated and deemed safe for discharge.     Maia Plan, MD 05/01/23 1153

## 2023-05-01 NOTE — BH Assessment (Signed)
Clinician spoke to Faith with [facility] to complete TTS assessment. Clinician provided MRN, location, age, provider name/number and room number. Secure message completed.    Redmond Pulling, MS, [facility], [facility] Triage Specialist [phone]

## 2023-05-01 NOTE — ED Notes (Signed)
Joseph [name] continues to complain of hearing voices that threats to shoot him and repeatedly asks staff to come out into the hall way of the unit to talk.

## 2023-05-01 NOTE — Consult Note (Signed)
[facility] Consult Note  Patient Name: Joseph [name] MRN: [id number] DOB: 1996/03/15 DATE OF Consult: 05/01/2023  PRIMARY PSYCHIATRIC DIAGNOSES  1.  Substance induced psychosis 2.  Stimulant use disorder 3.  Depression, unspecified  RECOMMENDATIONS  Recommendations: Medication recommendations: Continue Lexapro 10mg  po daily; recommend Ativan 1mg  po TID PRN anxiety; Recommend Ativan 2mg  po TID PRN agitation; hold Ativan for HR < 60; RR < 12; SBP < 90; oversedation Non-Medication/therapeutic recommendations: Repeat EKG and consider use of antipsychotics if Qtc < 500 Is inpatient psychiatric hospitalization recommended for this patient? No (Explain why): Recommend reeval in AM and appropriateness for rehab  Is another care setting recommended for this patient? (examples may include Crisis Stabilization Unit, Residential/Recovery Treatment, ALF/SNF, Memory Care Unit)  Yes (Explain why): Recommend reeval in the AM and consideration of rehab if patient still requesting  Follow-Up Telepsychiatry C/L services: We will continue to follow this patient with you until stabilized or discharged.  If you have any questions or concerns, please call our TeleCare Coordination service at  [phone] and ask for myself or the provider on-call. Communication: Treatment team members (and family members if applicable) who were involved in treatment/care discussions and planning, and with whom we spoke or engaged with via secure text/chat, include the following: Dr. Preston Fleeting; Reita Cliche  Thank you for involving [place] in the care of this patient. If you have any additional questions or concerns, please call [phone] and ask for me or the provider on-call.  TELEPSYCHIATRY ATTESTATION & CONSENT  As the provider for this telehealth consult, I attest that I verified the patient's identity using two separate identifiers, introduced myself to the patient, provided my credentials, disclosed my location, and performed this  encounter via a HIPAA-compliant, real-time, face-to-face, two-way, interactive audio and video platform and with the full consent and agreement of the patient (or guardian as applicable.)  Patient physical location: [facility]  Telehealth provider physical location: home office in state of [place]   Video start time: 0131 AM EST Video end time: 0144 AM EST  IDENTIFYING DATA  Joseph [name] is a 27 y.o. year-old male for whom a psychiatric consultation has been ordered by the primary provider. The patient was identified using two separate identifiers.  CHIEF COMPLAINT/REASON FOR CONSULT  Anxiety    HISTORY OF PRESENT ILLNESS (HPI)  Joseph [name] is a 27 year old male, experiencing homelessness, history of antisocial personality disorder, malingering, PTSD, substance-induced psychosis, substance induced mood disorder, alcohol use disorder, stimulant use disorder, opioid use disorder, who presents to the ED with anxiety, noted to appear paranoid in the ED. Of note, patient with multiple ED visits in the past for suicidal ideation, substance use, malingering, homelessness with notably poor adherence to outpatient treatment. In the ED tonight UDS positive for amphetamines and cannabis, alcohol negative. Qtc 517 on 8/14, no EKG from tonight. Patient given Ativan 1mg  and hydroxyzine 50mg  in the ED. Psychiatry consulted for disposition recommendations.   On evaluation, patient guarded, psychomotor agitated, paranoid, linear to direct questioning, not responding to internal stimuli, alert and oriented x 4. Patient reports, "I have bad anxiety." Patient states, "I did some drugs that I wasn't supposed to do. I'm isolated and I was just trying to go to another unit." Patient reports he used meth earlier in the day. He repeatedly states, "I don't feel comfortable in this room." Patient states, "I hear voices in my head and I have posttraumatic stress." Declines to disclose what the voices are saying.  And then states, "  I don't feel comfortable. I just keep hearing noises." Patient endorses depressed mood, anhedonia, poor concentration.  He states "the voices keep telling me they're going to shoot me." He denies command auditory hallucinations. Denies suicidal ideation, intent, and plan. Denies homicidal ideation. Reports he slept on the street last night. Patient then terminates evaluation due to paranoia.    PAST PSYCHIATRIC HISTORY  Current psych meds: Denies  Prior psych meds: Trazodone, Lexapro, Prozac, hydroxyzine  Outpatient: Per chart review, non-adherent with outpatient treatment, has a peer support specialist  Inpatient: Multiple Non-suicidal self injury: Denies  Suicide attempts: Denies  Violence: Denies  Drugs/alcohol: Daily meth use. History of fentanyl, cocaine, alcohol use Otherwise as per HPI above.  PAST MEDICAL HISTORY  Past Medical History:  Diagnosis Date   Psychoactive substance-induced psychosis (HCC) 07/27/2022     HOME MEDICATIONS  Facility Ordered Medications  Medication   [COMPLETED] hydrOXYzine (ATARAX) tablet 50 mg   escitalopram (LEXAPRO) tablet 10 mg   LORazepam (ATIVAN) tablet 1 mg   PTA Medications  Medication Sig   escitalopram (LEXAPRO) 10 MG tablet Take 1 tablet (10 mg total) by mouth daily. (Patient not taking: Reported on 04/20/2023)   naloxone (NARCAN) 0.4 MG/ML injection Inject 1 mL (0.4 mg total) into the muscle as needed. (Patient not taking: Reported on 04/20/2023)   nicotine (NICODERM CQ - DOSED IN MG/24 HR) 7 mg/24hr patch Place 1 patch (7 mg total) onto the skin daily as needed (prn). (Patient not taking: Reported on 04/20/2023)     ALLERGIES  No Known Allergies  SOCIAL & SUBSTANCE USE HISTORY  Social History   Socioeconomic History   Marital status: Single    Spouse name: Not on file   Number of children: Not on file   Years of education: Not on file   Highest education level: Not on file  Occupational History   Not on file   Tobacco Use   Smoking status: Former    Current packs/day: 0.00    Types: Cigarettes    Quit date: 07/15/2022    Years since quitting: 0.7   Smokeless tobacco: Former    Quit date: 07/15/2022  Vaping Use   Vaping status: Never Used  Substance and Sexual Activity   Alcohol use: Yes    Alcohol/week: 8.0 standard drinks of alcohol    Types: 8 Cans of beer per week   Drug use: Yes    Types: Benzodiazepines, Marijuana   Sexual activity: Yes  Other Topics Concern   Not on file  Social History Narrative   ** Merged History Encounter **       Social Determinants of Health   Financial Resource Strain: Not on file  Food Insecurity: No Food Insecurity (04/18/2023)   Hunger Vital Sign    Worried About Running Out of Food in the Last Year: Never true    Ran Out of Food in the Last Year: Never true  Recent Concern: Food Insecurity - Food Insecurity Present (04/17/2023)   Hunger Vital Sign    Worried About Running Out of Food in the Last Year: Sometimes true    Ran Out of Food in the Last Year: Sometimes true  Transportation Needs: No Transportation Needs (04/18/2023)   PRAPARE - Administrator, Civil Service (Medical): No    Lack of Transportation (Non-Medical): No  Recent Concern: Transportation Needs - Unmet Transportation Needs (04/17/2023)   PRAPARE - Transportation    Lack of Transportation (Medical): Yes    Lack of  Transportation (Non-Medical): No  Physical Activity: Not on file  Stress: Not on file  Social Connections: Unknown (09/08/2022)   Received from [facility]   Social Network    Social Network: Not on file   Social History   Tobacco Use  Smoking Status Former   Current packs/day: 0.00   Types: Cigarettes   Quit date: 07/15/2022   Years since quitting: 0.7  Smokeless Tobacco Former   Quit date: 07/15/2022   Social History   Substance and Sexual Activity  Alcohol Use Yes   Alcohol/week: 8.0 standard drinks of alcohol   Types: 8 Cans of  beer per week   Social History   Substance and Sexual Activity  Drug Use Yes   Types: Benzodiazepines, Marijuana    Additional pertinent information: daily meth use   FAMILY HISTORY   Family Psychiatric History (if known):  Family history of substance use   MENTAL STATUS EXAM (MSE)  Presentation  General Appearance:  Appropriate  Eye Contact: Poor  Speech: Regular rate, rhythm, volume   Mood and Affect  Mood: Anxious  Affect: Mood-congruent   Thought Process  Thought Processes: Linear to direct questioning  Descriptions of Associations: Intact  Orientation: Full (Time, Place and Person)  Thought Content: Paranoid Ideation  History of Schizophrenia/Schizoaffective disorder: No   Hallucinations: Hallucinations: Auditory  Ideas of Reference: None  Suicidal Thoughts: Suicidal Thoughts: No  Homicidal Thoughts: Homicidal Thoughts: No   Sensorium  Memory: Immediate Poor; Recent Poor; Remote Poor  Judgment: Fair  Insight: Fair   Art therapist  Concentration: Poor  Attention Span: Poor  Recall: poor  Fund of Knowledge: Good  Language: Good   Psychomotor Activity  Psychomotor Activity: Psychomotor Activity: Increased   Assets  Assets: Communication Skills; Desire for Improvement    VITALS  Blood pressure 137/75, pulse 75, temperature 97.8 F (36.6 C), temperature source Oral, resp. rate 20, height 5\' 10"  (1.778 m), weight 94.3 kg, SpO2 100%.  LABS  Admission on 04/30/2023  Component Date Value Ref Range Status   Sodium 04/30/2023 137  135 - 145 mmol/L Final   Potassium 04/30/2023 3.1 (L)  3.5 - 5.1 mmol/L Final   Chloride 04/30/2023 104  98 - 111 mmol/L Final   CO2 04/30/2023 22  22 - 32 mmol/L Final   Glucose, Bld 04/30/2023 104 (H)  70 - 99 mg/dL Final   Glucose reference range applies only to samples taken after fasting for at least 8 hours.   BUN 04/30/2023 8  6 - 20 mg/dL Final   Creatinine, Ser  04/30/2023 1.15  0.61 - 1.24 mg/dL Final   Calcium 39/11/90 10.0  8.9 - 10.3 mg/dL Final   Total Protein 33/00/7622 8.6 (H)  6.5 - 8.1 g/dL Final   Albumin 63/33/5456 4.6  3.5 - 5.0 g/dL Final   AST 25/63/8937 28  15 - 41 U/L Final   ALT 04/30/2023 23  0 - 44 U/L Final   Alkaline Phosphatase 04/30/2023 67  38 - 126 U/L Final   Total Bilirubin 04/30/2023 0.7  0.3 - 1.2 mg/dL Final   GFR, Estimated 04/30/2023 >60  >60 mL/min Final   Comment: (NOTE) Calculated using the CKD-EPI Creatinine Equation (2021)    Anion gap 04/30/2023 11  5 - 15 Final   Performed at [facility], [address], [place], [place] [address]   Alcohol, Ethyl (B) 04/30/2023 <10  <10 mg/dL Final   Comment: (NOTE) Lowest detectable limit for serum alcohol is  10 mg/dL.  For medical purposes only. Performed at [facility], [address] [place], [place] [address]    Opiates 04/30/2023 NONE DETECTED  NONE DETECTED Final   Cocaine 04/30/2023 NONE DETECTED  NONE DETECTED Final   Benzodiazepines 04/30/2023 NONE DETECTED  NONE DETECTED Final   Amphetamines 04/30/2023 POSITIVE (A)  NONE DETECTED Final   Tetrahydrocannabinol 04/30/2023 POSITIVE (A)  NONE DETECTED Final   Barbiturates 04/30/2023 NONE DETECTED  NONE DETECTED Final   Comment: (NOTE) DRUG SCREEN FOR MEDICAL PURPOSES ONLY.  IF CONFIRMATION IS NEEDED FOR ANY PURPOSE, NOTIFY LAB WITHIN 5 DAYS.  LOWEST DETECTABLE LIMITS FOR URINE DRUG SCREEN Drug Class                     Cutoff (ng/mL) Amphetamine and metabolites    1000 Barbiturate and metabolites    200 Benzodiazepine                 200 Opiates and metabolites        300 Cocaine and metabolites        300 THC                            50 Performed at [facility], [address] [place], [place] [address]    WBC 04/30/2023 7.0  4.0 - 10.5 K/uL Final   RBC 04/30/2023 5.27  4.22 - 5.81 MIL/uL Final   Hemoglobin 04/30/2023 15.8  13.0 -  17.0 g/dL Final   HCT 44/09/270 46.1  39.0 - 52.0 % Final   MCV 04/30/2023 87.5  80.0 - 100.0 fL Final   MCH 04/30/2023 30.0  26.0 - 34.0 pg Final   MCHC 04/30/2023 34.3  30.0 - 36.0 g/dL Final   RDW 53/66/4403 13.0  11.5 - 15.5 % Final   Platelets 04/30/2023 373  150 - 400 K/uL Final   nRBC 04/30/2023 0.0  0.0 - 0.2 % Final   Neutrophils Relative % 04/30/2023 61  % Final   Neutro Abs 04/30/2023 4.3  1.7 - 7.7 K/uL Final   Lymphocytes Relative 04/30/2023 26  % Final   Lymphs Abs 04/30/2023 1.9  0.7 - 4.0 K/uL Final   Monocytes Relative 04/30/2023 10  % Final   Monocytes Absolute 04/30/2023 0.7  0.1 - 1.0 K/uL Final   Eosinophils Relative 04/30/2023 3  % Final   Eosinophils Absolute 04/30/2023 0.2  0.0 - 0.5 K/uL Final   Basophils Relative 04/30/2023 0  % Final   Basophils Absolute 04/30/2023 0.0  0.0 - 0.1 K/uL Final   Immature Granulocytes 04/30/2023 0  % Final   Abs Immature Granulocytes 04/30/2023 0.02  0.00 - 0.07 K/uL Final   Performed at [facility], [address], [place], [place] [address]   Salicylate Lvl 04/30/2023 <7.0 (L)  7.0 - 30.0 mg/dL Final   Performed at [facility], [address] [place], [place] [address]   Acetaminophen (Tylenol), Serum 04/30/2023 <10 (L)  10 - 30 ug/mL Final   Comment: (NOTE) Therapeutic concentrations vary significantly. A range of 10-30 ug/mL  may be an effective concentration for many patients. However, some  are best treated at concentrations outside of this range. Acetaminophen concentrations >150 ug/mL at 4 hours after ingestion  and >50 ug/mL at 12 hours after ingestion are often associated with  toxic reactions.  Performed at [facility], [address] [place],  [place] [address]     PSYCHIATRIC REVIEW OF SYSTEMS (ROS)  ROS: Notable for the following relevant positive findings: Review of Systems  Psychiatric/Behavioral:  Positive for depression, hallucinations and  substance abuse. The patient is nervous/anxious.     Additional findings:      Musculoskeletal: No abnormal movements observed      Gait & Station: Normal      Pain Screening: Denies      Nutrition & Dental Concerns: n/a  RISK FORMULATION/ASSESSMENT  Is the patient experiencing any suicidal or homicidal ideations: No        Protective factors considered for safety management: Help seeking behavior   Risk factors/concerns considered for safety management:  Depression Substance abuse/dependence Male gender  Is there a safety management plan with the patient and treatment team to minimize risk factors and promote protective factors: Yes           Explain: Re-evaluation in the morning and consideration of substance use treatment program if patient still interested  Is crisis care placement or psychiatric hospitalization recommended: No     Based on my current evaluation and risk assessment, patient is determined at this time to be at:  Low risk  *RISK ASSESSMENT Risk assessment is a dynamic process; it is possible that this patient's condition, and risk level, may change. This should be re-evaluated and managed over time as appropriate. Please re-consult psychiatric consult services if additional assistance is needed in terms of risk assessment and management. If your team decides to discharge this patient, please advise the patient how to best access emergency psychiatric services, or to call 911, if their condition worsens or they feel unsafe in any way.  Joseph [name] is a 27 year old male, experiencing homelessness, history of antisocial personality disorder, malingering, PTSD, substance-induced psychosis, substance induced mood disorder, alcohol use disorder, stimulant use disorder, opioid use disorder, sedative, hypnotic or anxiolytic use disorder, who presents to the ED with anxiety, noted to appear paranoid in the ED. Of note, patient with multiple ED visits in the past for suicidal  ideation, substance use, malingering, homelessness with notably poor adherence to outpatient treatment. In the ED tonight UDS positive for amphetamines and cannabis, alcohol negative. Qtc 517 on 8/14, no EKG from tonight. Patient given Ativan 1mg  and hydroxyzine 50mg  in the ED. Psychiatry consulted for disposition recommendations. On evaluation, patient guarded, psychomotor agitated, paranoid, linear to direct questioning, not responding to internal stimuli, alert and oriented x 4. Patient reports he used meth today and feels anxious and paranoid. Reports he hears voices telling him they are going to shoot him. Denies command auditory hallucinations. Denies suicidal ideation, intent, and plan. Denies homicidal ideation. Endorses depressed mood.  Patient's presentation is consistent with substance induced psychosis, stimulant use disorder, depression unspecified. At this time, patient appears acutely meth intoxicated. Recommend re-evaluation in the AM and consideration of substance use treatment if appropriate. Patient does not meet criteria for inpatient psychiatric hospitalization at the current time, but re-eval in the AM.    Adria Dill, MD Telepsychiatry Consult Services

## 2023-05-01 NOTE — ED Notes (Signed)
Mr Bartolomucci is currently asleep with no signs of distress or sleep disturbance.

## 2023-05-01 NOTE — Discharge Instructions (Signed)

## 2023-05-01 NOTE — ED Notes (Addendum)
TTS  with Joseph [name] is complete and pt for orders. Joseph [name] remains very paranoid and requesting staff to stay near him. " I can't take this I need to get out of here, please please please come out here and talk to me". Pt request any new staff that come to the nurse station to come out to the hall way and talk to him. Additional medications are being considered but pt abnormal EKG complicates medications choices

## 2023-05-01 NOTE — ED Provider Notes (Signed)
Psychiatry consult appreciated, will need re-evaluation in the morning.   Dione Booze, MD 05/01/23 [phone]

## 2023-05-02 ENCOUNTER — Emergency Department (HOSPITAL_COMMUNITY)
Admission: EM | Admit: 2023-05-02 | Discharge: 2023-05-03 | Disposition: A | Discharge status: Home / Self Care | Payer: No Typology Code available for payment source | Attending: Emergency Medicine | Admitting: Emergency Medicine

## 2023-05-02 ENCOUNTER — Other Ambulatory Visit: Payer: Self-pay

## 2023-05-02 DIAGNOSIS — F1420 Cocaine dependence, uncomplicated: Secondary | ICD-10-CM | POA: Diagnosis present

## 2023-05-02 DIAGNOSIS — Z87891 Personal history of nicotine dependence: Secondary | ICD-10-CM | POA: Diagnosis not present

## 2023-05-02 DIAGNOSIS — F419 Anxiety disorder, unspecified: Secondary | ICD-10-CM

## 2023-05-02 DIAGNOSIS — Z5900 Homelessness unspecified: Secondary | ICD-10-CM | POA: Insufficient documentation

## 2023-05-02 DIAGNOSIS — F1994 Other psychoactive substance use, unspecified with psychoactive substance-induced mood disorder: Secondary | ICD-10-CM | POA: Diagnosis present

## 2023-05-02 LAB — COMPREHENSIVE METABOLIC PANEL
ALT: 20 U/L (ref 0–44)
AST: 29 U/L (ref 15–41)
Albumin: 4.7 g/dL (ref 3.5–5.0)
Alkaline Phosphatase: 63 U/L (ref 38–126)
Anion gap: 14 (ref 5–15)
BUN: 12 mg/dL (ref 6–20)
CO2: 19 mmol/L — ABNORMAL LOW (ref 22–32)
Calcium: 9.5 mg/dL (ref 8.9–10.3)
Chloride: 102 mmol/L (ref 98–111)
Creatinine, Ser: 1.23 mg/dL (ref 0.61–1.24)
GFR, Estimated: >60 mL/min (ref >60)
Glucose, Bld: 83 mg/dL (ref 70–99)
Potassium: 3 mmol/L — ABNORMAL LOW (ref 3.5–5.1)
Sodium: 135 mmol/L (ref 135–145)
Total Bilirubin: 1.2 mg/dL (ref 0.3–1.2)
Total Protein: 8.3 g/dL — ABNORMAL HIGH (ref 6.5–8.1)

## 2023-05-02 LAB — CBC WITH DIFFERENTIAL/PLATELET
Abs Immature Granulocytes: 0.03 10*3/uL (ref 0.00–0.07)
Basophils Absolute: 0 10*3/uL (ref 0.0–0.1)
Basophils Relative: 1 %
Eosinophils Absolute: 0 10*3/uL (ref 0.0–0.5)
Eosinophils Relative: 0 %
HCT: 44.9 % (ref 39.0–52.0)
Hemoglobin: 15.6 g/dL (ref 13.0–17.0)
Immature Granulocytes: 0 %
Lymphocytes Relative: 28 %
Lymphs Abs: 2 10*3/uL (ref 0.7–4.0)
MCH: 30.1 pg (ref 26.0–34.0)
MCHC: 34.7 g/dL (ref 30.0–36.0)
MCV: 86.5 fL (ref 80.0–100.0)
Monocytes Absolute: 0.7 10*3/uL (ref 0.1–1.0)
Monocytes Relative: 11 %
Neutro Abs: 4.2 10*3/uL (ref 1.7–7.7)
Neutrophils Relative %: 60 %
Platelets: 345 10*3/uL (ref 150–400)
RBC: 5.19 MIL/uL (ref 4.22–5.81)
RDW: 13.1 % (ref 11.5–15.5)
WBC: 7 10*3/uL (ref 4.0–10.5)
nRBC: 0 % (ref 0.0–0.2)

## 2023-05-02 LAB — RAPID URINE DRUG SCREEN, HOSP PERFORMED
Amphetamines: POSITIVE — AB
Barbiturates: NOT DETECTED
Benzodiazepines: NOT DETECTED
Cocaine: POSITIVE — AB
Opiates: NOT DETECTED
Tetrahydrocannabinol: NOT DETECTED

## 2023-05-02 LAB — ETHANOL: Alcohol, Ethyl (B): <10 mg/dL (ref <10)

## 2023-05-02 MED ORDER — POTASSIUM CHLORIDE CRYS ER 20 MEQ PO TBCR
40.0000 meq | EXTENDED_RELEASE_TABLET | Freq: Once | ORAL | Status: AC
  Administered 2023-05-02: 40 meq via ORAL
  Filled 2023-05-02: qty 2

## 2023-05-02 NOTE — ED Triage Notes (Signed)
BIBA from [facility] for psych assessment. Pt is homeless, states he is scared, anxious, not taking meds- pt endorses substance abuse, but wont states which ones. Pt states he "needs to have a sitter". 140/82 BP. 111 HR 98% room air 102 cbg 26 RR

## 2023-05-02 NOTE — Consult Note (Signed)
[facility] ED ASSESSMENT   Reason for Consult:  Psychiatry evaluation  Referring Physician:  ER Physician Patient Identification: Joseph [name] MRN:  [id number] ED Chief Complaint: Psychoactive substance-induced mood disorder (HCC)  Diagnosis:  Principal Problem:   Psychoactive substance-induced mood disorder (HCC) Active Problems:   Cocaine use disorder, moderate, dependence (HCC)   ED Assessment Time Calculation: Start Time: 1858 Stop Time: 1924 Total Time in Minutes (Assessment Completion): 26   Subjective:   Joseph [name] is a 27 y.o. male patient admitted with  history significant for MDD, Polysubstance abuse, PTSD, Anxiety and Homelessness came back to the ER after he was discharged yesterday with written information for long term substance abuse facilities.  Patient was brought in by EMS from a food [employer] parking lot for feeling anxious.  On arrival patient states he needs a Comptroller.  HPI:  Patient was seen by this provider this evening.  Patient did not make eye contact but reported " I am homeless I told you yesterday, I want you to refer me to Long term substance abuse place and you are not helping me"  Provider reminded patient that he was given Phone numbers, names of three facilities and locations to call yesterday.  Patient came back with UDS positive for Amphetamine and Cocaine.  Patient is calm and sitting down.  He asked for SW assistance and is told one will be in tomorrow.  Patient is well groomed and nourished.  He states that he hospital will assist him find a home or long term treatment facility.  Patient denies SI/HI/ [facility] but reports Auditory hallucination but unable to state what he is hearing.  Patient utilizes the ER frequently and moves around from one ER to another.  Patient will spend the night and be monitored .  Tomorrow he will be reevaluated and appropriate disposition will be determined.  TOC will be added tomorrow.    Past Psychiatric History:  MDD, Polysubstance abuse,  PTSD, Anxiety and Homelessness.  Utilizes ER for Mental health care.  [facility] hospitalization last year and he also gets care at [facility]  Risk to Self or Others: Is the patient at risk to self? No Has the patient been a risk to self in the past 6 months? No Has the patient been a risk to self within the distant past? No Is the patient a risk to others? No Has the patient been a risk to others in the past 6 months? No Has the patient been a risk to others within the distant past? No  [place] Scale:  Flowsheet Row ED from 05/02/2023 in [facility] Emergency Department at [facility] ED from 04/30/2023 in [facility] Emergency Department at [facility] ED from 04/21/2023 in [facility]  C-SSRS RISK CATEGORY No Risk No Risk No Risk       AIMS:  , , ,  ,   ASAM:    Substance Abuse:     Past Medical History:  Past Medical History:  Diagnosis Date   Psychoactive substance-induced psychosis (HCC) 07/27/2022    Past Surgical History:  Procedure Laterality Date   CYST REMOVAL HAND  06/22/2022   Procedure: Repair left hand laceration;  Surgeon: Victorino Sparrow, MD;  Location: [facility] OR;  Service: Vascular;;   HEMATOMA EVACUATION Left 06/22/2022   Procedure: Guss Bunde;  Surgeon: Victorino Sparrow, MD;  Location: [facility] OR;  Service: Vascular;  Laterality: Left;   LAPAROTOMY N/A 06/22/2022   Procedure: EXPLORATORY LAPAROTOMY PLACEMENT OF  LEFT CHEST TUBE. REPAIR OF 7 CENTIMETER RIGHT SCALP LACERATION. REPAIR OF SCALP LACERATION  AT CROWN.;  Surgeon: Gaynelle Adu, MD;  Location: [facility] OR;  Service: General;  Laterality: N/A;   WOUND EXPLORATION Left 06/22/2022   Procedure: Left chest exploration and intercostal artery ligation;  Surgeon: Victorino Sparrow, MD;  Location: [facility] OR;  Service: Vascular;  Laterality: Left;   Family History: No family history on file. Family Psychiatric  History: Denies Social History:  Social History   Substance and Sexual  Activity  Alcohol Use Yes   Alcohol/week: 8.0 standard drinks of alcohol   Types: 8 Cans of beer per week     Social History   Substance and Sexual Activity  Drug Use Yes   Types: Benzodiazepines, Marijuana    Social History   Socioeconomic History   Marital status: Single    Spouse name: Not on file   Number of children: Not on file   Years of education: Not on file   Highest education level: Not on file  Occupational History   Not on file  Tobacco Use   Smoking status: Former    Current packs/day: 0.00    Types: Cigarettes    Quit date: 07/15/2022    Years since quitting: 0.7   Smokeless tobacco: Former    Quit date: 07/15/2022  Vaping Use   Vaping status: Never Used  Substance and Sexual Activity   Alcohol use: Yes    Alcohol/week: 8.0 standard drinks of alcohol    Types: 8 Cans of beer per week   Drug use: Yes    Types: Benzodiazepines, Marijuana   Sexual activity: Yes  Other Topics Concern   Not on file  Social History Narrative   ** Merged History Encounter **       Social Determinants of Health   Financial Resource Strain: Not on file  Food Insecurity: No Food Insecurity (04/18/2023)   Hunger Vital Sign    Worried About Running Out of Food in the Last Year: Never true    Ran Out of Food in the Last Year: Never true  Recent Concern: Food Insecurity - Food Insecurity Present (04/17/2023)   Hunger Vital Sign    Worried About Running Out of Food in the Last Year: Sometimes true    Ran Out of Food in the Last Year: Sometimes true  Transportation Needs: No Transportation Needs (04/18/2023)   PRAPARE - Administrator, Civil Service (Medical): No    Lack of Transportation (Non-Medical): No  Recent Concern: Transportation Needs - Unmet Transportation Needs (04/17/2023)   PRAPARE - Administrator, Civil Service (Medical): Yes    Lack of Transportation (Non-Medical): No  Physical Activity: Not on file  Stress: Not on file  Social Connections:  Unknown (09/08/2022)   Received from [facility]   Social Network    Social Network: Not on file   Additional Social History:    Allergies:  No Known Allergies  Labs:  Results for orders placed or performed during the hospital encounter of 05/02/23 (from the past 48 hour(s))  Urine rapid drug screen (hosp performed)     Status: Abnormal   Collection Time: 05/02/23  5:28 PM  Result Value Ref Range   Opiates NONE DETECTED NONE DETECTED   Cocaine POSITIVE (A) NONE DETECTED   Benzodiazepines NONE DETECTED NONE DETECTED   Amphetamines POSITIVE (A) NONE DETECTED   Tetrahydrocannabinol NONE DETECTED NONE DETECTED   Barbiturates NONE DETECTED NONE  DETECTED    Comment: (NOTE) DRUG SCREEN FOR MEDICAL PURPOSES ONLY.  IF CONFIRMATION IS NEEDED FOR ANY PURPOSE, NOTIFY LAB WITHIN 5 DAYS.  LOWEST DETECTABLE LIMITS FOR URINE DRUG SCREEN Drug Class                     Cutoff (ng/mL) Amphetamine and metabolites    1000 Barbiturate and metabolites    200 Benzodiazepine                 200 Opiates and metabolites        300 Cocaine and metabolites        300 THC                            50 Performed at [facility], [address] [place], [place] [address]   Comprehensive metabolic panel     Status: Abnormal   Collection Time: 05/02/23  5:52 PM  Result Value Ref Range   Sodium 135 135 - 145 mmol/L   Potassium 3.0 (L) 3.5 - 5.1 mmol/L   Chloride 102 98 - 111 mmol/L   CO2 19 (L) 22 - 32 mmol/L   Glucose, Bld 83 70 - 99 mg/dL    Comment: Glucose reference range applies only to samples taken after fasting for at least 8 hours.   BUN 12 6 - 20 mg/dL   Creatinine, Ser 6.29 0.61 - 1.24 mg/dL   Calcium 9.5 8.9 - 52.8 mg/dL   Total Protein 8.3 (H) 6.5 - 8.1 g/dL   Albumin 4.7 3.5 - 5.0 g/dL   AST 29 15 - 41 U/L   ALT 20 0 - 44 U/L   Alkaline Phosphatase 63 38 - 126 U/L   Total Bilirubin 1.2 0.3 - 1.2 mg/dL   GFR, Estimated >41 >32 mL/min     Comment: (NOTE) Calculated using the CKD-EPI Creatinine Equation (2021)    Anion gap 14 5 - 15    Comment: Performed at [facility], [address] [place], [place] [address]  Ethanol     Status: None   Collection Time: 05/02/23  5:52 PM  Result Value Ref Range   Alcohol, Ethyl (B) <10 <10 mg/dL    Comment: (NOTE) Lowest detectable limit for serum alcohol is 10 mg/dL.  For medical purposes only. Performed at [facility], [address] [place], [place] [address]   CBC with Diff     Status: None   Collection Time: 05/02/23  5:52 PM  Result Value Ref Range   WBC 7.0 4.0 - 10.5 K/uL   RBC 5.19 4.22 - 5.81 MIL/uL   Hemoglobin 15.6 13.0 - 17.0 g/dL   HCT 66.4 40.3 - 47.4 %   MCV 86.5 80.0 - 100.0 fL   MCH 30.1 26.0 - 34.0 pg   MCHC 34.7 30.0 - 36.0 g/dL   RDW 25.9 56.3 - 87.5 %   Platelets 345 150 - 400 K/uL   nRBC 0.0 0.0 - 0.2 %   Neutrophils Relative % 60 %   Neutro Abs 4.2 1.7 - 7.7 K/uL   Lymphocytes Relative 28 %   Lymphs Abs 2.0 0.7 - 4.0 K/uL   Monocytes Relative 11 %   Monocytes Absolute 0.7 0.1 - 1.0 K/uL   Eosinophils Relative 0 %   Eosinophils Absolute 0.0 0.0 - 0.5 K/uL   Basophils Relative 1 %   Basophils Absolute 0.0 0.0 -  0.1 K/uL   Immature Granulocytes 0 %   Abs Immature Granulocytes 0.03 0.00 - 0.07 K/uL    Comment: Performed at [facility], [address] [place], [place] [address]    No current facility-administered medications for this encounter.   Current Outpatient Medications  Medication Sig Dispense Refill   escitalopram (LEXAPRO) 10 MG tablet Take 1 tablet (10 mg total) by mouth daily. (Patient not taking: Reported on 04/20/2023) 30 tablet 0   naloxone (NARCAN) 0.4 MG/ML injection Inject 1 mL (0.4 mg total) into the muscle as needed. (Patient not taking: Reported on 04/20/2023) 1 mL 0   nicotine (NICODERM CQ - DOSED IN MG/24 HR) 7 mg/24hr patch Place 1 patch (7 mg total) onto the skin  daily as needed (prn). (Patient not taking: Reported on 04/20/2023) 30 patch 0    Musculoskeletal: Strength & Muscle Tone: within normal limits Gait & Station: normal Patient leans: Front   Psychiatric Specialty Exam: Presentation  General Appearance:  Casual; Neat  Eye Contact: Fleeting  Speech: Clear and Coherent; Normal Rate  Speech Volume: Normal  Handedness: Right   Mood and Affect  Mood: Depressed  Affect: Congruent   Thought Process  Thought Processes: Coherent  Descriptions of Associations:Intact  Orientation:Full (Time, Place and Person)  Thought Content:Logical  History of Schizophrenia/Schizoaffective disorder:No  Duration of Psychotic Symptoms:N/A  Hallucinations:Hallucinations: Auditory Description of Auditory Hallucinations: Unable to describe what he is hearing.  Ideas of Reference:None  Suicidal Thoughts:Suicidal Thoughts: No  Homicidal Thoughts:Homicidal Thoughts: No   Sensorium  Memory: Immediate Good; Recent Good; Remote Good  Judgment: Impaired  Insight: Lacking   Executive Functions  Concentration: Fair  Attention Span: Fair  Recall: [employer] of Knowledge: Fair  Language: Fair   Psychomotor Activity  Psychomotor Activity: Psychomotor Activity: Normal   Assets  Assets: Communication Skills; Desire for Improvement; Physical Health    Sleep  Sleep: Sleep: Fair   Physical Exam: Physical Exam Vitals and nursing note reviewed.  Constitutional:      Appearance: Normal appearance.  HENT:     Head: Normocephalic.     Nose: Nose normal.  Cardiovascular:     Rate and Rhythm: Tachycardia present.  Pulmonary:     Effort: Pulmonary effort is normal.  Musculoskeletal:        General: Normal range of motion.     Cervical back: Normal range of motion.  Skin:    General: Skin is dry.  Neurological:     Mental Status: He is alert and oriented to person, place, and time.  Psychiatric:         Attention and Perception: Attention normal. He perceives auditory hallucinations.        Mood and Affect: Mood is anxious.        Speech: Speech normal.        Behavior: Behavior normal. Behavior is cooperative.        Thought Content: Thought content normal.        Cognition and Memory: Cognition and memory normal.        Judgment: Judgment is impulsive.    Review of Systems  Constitutional: Negative.   HENT: Negative.    Eyes: Negative.   Respiratory: Negative.    Cardiovascular: Negative.   Gastrointestinal: Negative.   Genitourinary: Negative.   Musculoskeletal: Negative.   Skin: Negative.   Neurological: Negative.   Endo/Heme/Allergies: Negative.   Psychiatric/Behavioral:  Positive for hallucinations and substance abuse.    Blood pressure 130/75, pulse Marland Kitchen)  107, temperature 98.3 F (36.8 C), temperature source Oral, resp. rate 20, SpO2 100%. There is no height or weight on file to calculate BMI.  Medical Decision Making: Patient denies SI/HI/AVH, he reports auditory hallucination and he used Amphetamine and Cocaine before coming back.  Patient will be monitored over night and reevaluated in the morning. He is asking for referral to long term substance abuse care.Marland Kitchen  TOC will be made tomorrow when patient is Psychiatrically cleared. Problem 1: Psychoactive Substance induced mood disorder  Problem 2: Cocaine Use disorder, Moderate Dependence  Disposition:  Reevaluate in am for appropriate disposition.  Earney Navy, NP-PMHNP-BC 05/02/2023 7:25 PM

## 2023-05-02 NOTE — ED Notes (Signed)
Pt is currently making phone call to significant other.

## 2023-05-02 NOTE — ED Provider Notes (Signed)
[place] EMERGENCY DEPARTMENT AT [facility] Provider Note   CSN: [id number] Arrival date & time: 05/02/23  1649     History  Chief Complaint  Patient presents with   Paranoid    Von Lownes is a 27 y.o. male.  27 year old male who presents emergency department today due to reported paranoia.  Patient was seen in the emergency department yesterday for similar symptoms, evaluated by TTS, discharge.  He comes back today because he is hoping to find a "ong-term mental health placement."  Patient denies any suicidal ideation or homicidal ideation.  He does report today he is hearing voices.        Home Medications Prior to Admission medications   Medication Sig Start Date End Date Taking? Authorizing Provider  escitalopram (LEXAPRO) 10 MG tablet Take 1 tablet (10 mg total) by mouth daily. Patient not taking: Reported on 04/20/2023 04/20/23 05/20/23  Augusto Gamble, MD  naloxone [facility]) 0.4 MG/ML injection Inject 1 mL (0.4 mg total) into the muscle as needed. Patient not taking: Reported on 04/20/2023 04/19/23   Augusto Gamble, MD  nicotine (NICODERM CQ - DOSED IN MG/24 HR) 7 mg/24hr patch Place 1 patch (7 mg total) onto the skin daily as needed (prn). Patient not taking: Reported on 04/20/2023 04/19/23 05/19/23  Augusto Gamble, MD      Allergies    Patient has no known allergies.    Review of Systems   Review of Systems  Physical Exam Updated Vital Signs BP 137/75   Pulse 93   Temp 98.3 F (36.8 C) (Oral)   Resp 17   SpO2 97%  Physical Exam Vitals reviewed.  Constitutional:      Appearance: Normal appearance.  HENT:     Head: Normocephalic and atraumatic.  Cardiovascular:     Rate and Rhythm: Tachycardia present.  Pulmonary:     Effort: Pulmonary effort is normal.  Abdominal:     General: Abdomen is flat.     Palpations: Abdomen is soft.  Neurological:     General: No focal deficit present.     Mental Status: He is alert.     Gait: Gait normal.  Psychiatric:         Mood and Affect: Mood is anxious. Affect is not angry.        Speech: Speech normal.        Behavior: Behavior normal.        Thought Content: Thought content is paranoid. Thought content is not delusional. Thought content does not include homicidal or suicidal ideation. Thought content does not include homicidal or suicidal plan.        Cognition and Memory: Cognition normal.        Judgment: Judgment normal. Judgment is not impulsive or inappropriate.     ED Results / Procedures / Treatments   Labs (all labs ordered are listed, but only abnormal results are displayed) Labs Reviewed  COMPREHENSIVE METABOLIC PANEL - Abnormal; Notable for the following components:      Result Value   Potassium 3.0 (*)    CO2 19 (*)    Total Protein 8.3 (*)    All other components within normal limits  RAPID URINE DRUG SCREEN, HOSP PERFORMED - Abnormal; Notable for the following components:   Cocaine POSITIVE (*)    Amphetamines POSITIVE (*)    All other components within normal limits  ETHANOL  CBC WITH DIFFERENTIAL/PLATELET    EKG None  Radiology DG Chest 2 View  Result Date: 05/01/2023  CLINICAL DATA:  Anxiety attack. EXAM: CHEST - 2 VIEW COMPARISON:  August 12, 2022 FINDINGS: The heart size and mediastinal contours are within normal limits. Both lungs are clear. No acute osseous abnormalities are identified. IMPRESSION: No active cardiopulmonary disease. Electronically Signed   By: Aram Candela M.D.   On: 05/01/2023 00:26    Procedures Procedures    Medications Ordered in ED Medications  potassium chloride SA (KLOR-CON M) CR tablet 40 mEq (40 mEq Oral Given 05/02/23 1838)    ED Course/ Medical Decision Making/ A&P                                 Medical Decision Making 27 year old male here today for reported paranoia.  Plan-will medically clear the patient, have him evaluated by TTS.  He was seen here yesterday, unclear if anything is changed.  I do think that the  patient's housing insecurity could be playing a role in his repeat presentations to the emergency department.  Reassessment-patient did have low potassium.  Have repleted.  Patient medically cleared.  He is still pending TTS evaluation, he will be seen in the morning.  Patient not currently taking any of his home medications, so none were ordered.  Amount and/or Complexity of Data Reviewed Labs: ordered.  Risk Prescription drug management.           Final Clinical Impression(s) / ED Diagnoses Final diagnoses:  Anxiety    Rx / DC Orders ED Discharge Orders     None         Arletha Pili, DO 05/02/23 2216

## 2023-05-02 NOTE — ED Notes (Signed)
Pt has 2 belongings bags with phone, shorts, shoes and shirt. Bags placed in locker #38

## 2023-05-03 DIAGNOSIS — F1994 Other psychoactive substance use, unspecified with psychoactive substance-induced mood disorder: Secondary | ICD-10-CM | POA: Diagnosis not present

## 2023-05-03 NOTE — ED Notes (Signed)
Patient discharged off unit to home per provider.  Patient alert, calm, cooperative, and no s/s of distress. Patient  discharge information given to the patient. Belongings given to patient. Patient ambulatory off unit, escorted by NT.  Patient given a bus pass for transportation.

## 2023-05-03 NOTE — Discharge Summary (Signed)
[facility] ED Discharge  05/03/2023 2:28 PM Nashon [place]hoite  MRN:  [id number]  Principal Problem: Psychoactive substance-induced mood disorder [facility]) Discharge Diagnoses: Principal Problem:   Psychoactive substance-induced mood disorder (HCC) Active Problems:   Cocaine use disorder, moderate, dependence (HCC)  Clinical Impression:  Final diagnoses:  Anxiety   Subjective:   ED Assessment Time Calculation: Start Time: 1030 Stop Time: 1045 Total Time in Minutes (Assessment Completion): 15   Past Psychiatric History: On today's reassessment patient is seen sitting on the edge of his bed, allowing provider to come in and speak with him, discussing the reason that he is here because he says "got high and had an anxiety attack." He states that he has a court date on May 24, 2023, stating that he wants to get help with drug abuse but also wants to make sure he makes to court date. Patient states that he is homeless, and that he really wants help with his drug abuse, he states that he and his peer support team are trying to get him in a long term treatment facility in [name]. Patient states he goes through a peer support program through [facility]. Patient currently denies SI/HI/AVH. Patient states his appetite and sleep are good.   Spoke with Elba Barman patient, the mother of patient's daughter, she stated that patient does have a history of drinking alcohol and using illicit substances.  She states that he does receive resources from [facility], and does have a peer support team.  She states that patient does not like a lot of change, such as getting a new job, when he perceives change as difficult, she states he goes back to his familiar surroundings which is using illicit substances.  She states right now they are not together as a couple but they are coparenting their daughter, states that once he receives help with his substance abuse, there is a possibility of them reconnecting as a  couple.   Abdurahman Fitzer is a 27 y.o. male patient admitted with  history significant for MDD, Polysubstance abuse, PTSD, Anxiety and Homelessness came back to the ER after he was discharged yesterday with written information for long term substance abuse facilities.   On assessment, patient was calm and cooperative for interview process. His eye contact was good. Psychomotor activity was normal. Patient describes his mood as "good" and his affect is congruent with mood. His thought process was normal and he answered questions appropriately, he did not display any flight of ideas. Associations were intact. There were no overt signs of delusions, paranoia, or responding to internal stimuli. Patient's insight and judgment are fair. Patient is able to contract for safety outside the hospital. At this time, the patient denies any suicidal ideations, homicidal ideations, auditory hallucinations, visual hallucinations, in addition to denying any paranoia or delusions apparent otherwise. Lastly, patient denies any elevated mood or euphoria that would be concerning for mania. At the conclusion of the evaluation, the patient voiced no other concerns.   Past Medical History:  Past Medical History:  Diagnosis Date   Psychoactive substance-induced psychosis (HCC) 07/27/2022    Past Surgical History:  Procedure Laterality Date   CYST REMOVAL HAND  06/22/2022   Procedure: Repair left hand laceration;  Surgeon: Victorino Sparrow, MD;  Location: [facility] OR;  Service: Vascular;;   HEMATOMA EVACUATION Left 06/22/2022   Procedure: Guss Bunde;  Surgeon: Victorino Sparrow, MD;  Location: [facility] OR;  Service: Vascular;  Laterality: Left;   LAPAROTOMY N/A 06/22/2022  Procedure: EXPLORATORY LAPAROTOMY PLACEMENT OF LEFT CHEST TUBE. REPAIR OF 7 CENTIMETER RIGHT SCALP LACERATION. REPAIR OF SCALP LACERATION  AT CROWN.;  Surgeon: Gaynelle Adu, MD;  Location: [facility] OR;  Service: General;  Laterality: N/A;   WOUND EXPLORATION Left  06/22/2022   Procedure: Left chest exploration and intercostal artery ligation;  Surgeon: Victorino Sparrow, MD;  Location: [facility] OR;  Service: Vascular;  Laterality: Left;   Family History: No family history on file.  Social History:  Social History   Substance and Sexual Activity  Alcohol Use Yes   Alcohol/week: 8.0 standard drinks of alcohol   Types: 8 Cans of beer per week     Social History   Substance and Sexual Activity  Drug Use Yes   Types: Benzodiazepines, Marijuana    Social History   Socioeconomic History   Marital status: Single    Spouse name: Not on file   Number of children: Not on file   Years of education: Not on file   Highest education level: Not on file  Occupational History   Not on file  Tobacco Use   Smoking status: Former    Current packs/day: 0.00    Types: Cigarettes    Quit date: 07/15/2022    Years since quitting: 0.8   Smokeless tobacco: Former    Quit date: 07/15/2022  Vaping Use   Vaping status: Never Used  Substance and Sexual Activity   Alcohol use: Yes    Alcohol/week: 8.0 standard drinks of alcohol    Types: 8 Cans of beer per week   Drug use: Yes    Types: Benzodiazepines, Marijuana   Sexual activity: Yes  Other Topics Concern   Not on file  Social History Narrative   ** Merged History Encounter **       Social Determinants of Health   Financial Resource Strain: Not on file  Food Insecurity: No Food Insecurity (04/18/2023)   Hunger Vital Sign    Worried About Running Out of Food in the Last Year: Never true    Ran Out of Food in the Last Year: Never true  Recent Concern: Food Insecurity - Food Insecurity Present (04/17/2023)   Hunger Vital Sign    Worried About Running Out of Food in the Last Year: Sometimes true    Ran Out of Food in the Last Year: Sometimes true  Transportation Needs: No Transportation Needs (04/18/2023)   PRAPARE - Administrator, Civil Service (Medical): No    Lack of Transportation  (Non-Medical): No  Recent Concern: Transportation Needs - Unmet Transportation Needs (04/17/2023)   PRAPARE - Administrator, Civil Service (Medical): Yes    Lack of Transportation (Non-Medical): No  Physical Activity: Not on file  Stress: Not on file  Social Connections: Unknown (09/08/2022)   [facility] from [facility], [facility]   Social Network    Social Network: Not on file    Tobacco Cessation:  A prescription for an FDA-approved tobacco cessation medication was offered at discharge and the patient refused  Current Medications: No current facility-administered medications for this encounter.   Current Outpatient Medications  Medication Sig Dispense Refill   escitalopram (LEXAPRO) 10 MG tablet Take 1 tablet (10 mg total) by mouth daily. (Patient not taking: Reported on 04/20/2023) 30 tablet 0   naloxone (NARCAN) 0.4 MG/ML injection Inject 1 mL (0.4 mg total) into the muscle as needed. (Patient not taking: Reported on 04/20/2023) 1 mL 0   nicotine (NICODERM CQ -  DOSED IN MG/24 HR) 7 mg/24hr patch Place 1 patch (7 mg total) onto the skin daily as needed (prn). (Patient not taking: Reported on 04/20/2023) 30 patch 0   PTA Medications: (Not in a hospital admission)   [place] Scale:  Flowsheet Row ED from 05/02/2023 in [facility] Emergency Department at [facility] ED from 04/30/2023 in [facility] Emergency Department at [facility] ED from 04/21/2023 in [facility]  C-SSRS RISK CATEGORY No Risk No Risk No Risk       Musculoskeletal: Strength & Muscle Tone: within normal limits Gait & Station: normal Patient leans: N/A  Psychiatric Specialty Exam: Presentation  General Appearance:  Appropriate for Environment  Eye Contact: Good  Speech: Clear and Coherent  Speech Volume: Normal  Handedness: Right   Mood and Affect  Mood: Euthymic  Affect: Appropriate   Thought Process  Thought  Processes: Coherent  Descriptions of Associations:Intact  Orientation:Full (Time, Place and Person)  Thought Content:Logical  History of Schizophrenia/Schizoaffective disorder:No  Duration of Psychotic Symptoms:N/A  Hallucinations:Hallucinations: None Description of Auditory Hallucinations: Unable to describe what he is hearing.  Ideas of Reference:None  Suicidal Thoughts:Suicidal Thoughts: No  Homicidal Thoughts:Homicidal Thoughts: No   Sensorium  Memory: Immediate Fair; Recent Fair  Judgment: Fair  Insight: Fair   Chartered certified accountant: Fair  Attention Span: Fair  Recall: [employer] of Knowledge: Fair  Language: Fair   Psychomotor Activity  Psychomotor Activity: Psychomotor Activity: Normal   Assets  Assets: Manufacturing systems engineer; Social Support   Sleep  Sleep: Sleep: Good    Physical Exam: Physical Exam Vitals and nursing note reviewed.  Musculoskeletal:        General: Normal range of motion.  Neurological:     Mental Status: He is alert.  Psychiatric:        Mood and Affect: Mood normal.        Behavior: Behavior normal.        Thought Content: Thought content normal.        Judgment: Judgment normal.    Review of Systems  Constitutional: Negative.   Musculoskeletal: Negative.   Psychiatric/Behavioral:  Positive for substance abuse.    Blood pressure 129/78, pulse 72, temperature 98 F (36.7 C), resp. rate 18, SpO2 98%. There is no height or weight on file to calculate BMI.   Demographic Factors:  Male and Low socioeconomic status  Loss Factors: Financial problems/change in socioeconomic status  Risk Reduction Factors:   Responsible for children under 52 years of age and Positive social support  Continued Clinical Symptoms:  Alcohol/Substance Abuse/Dependencies  Suicide Risk:  Minimal: No identifiable suicidal ideation.  Patients presenting with no risk factors but with morbid ruminations; may be  classified as minimal risk based on the severity of the depressive symptoms   Follow-up Information     Schedule an appointment as soon as possible for a visit  with [facility].   Specialty: Urgent Care Why: As needed Contact information: [address] [place] [address] [phone]                 Medical Decision Making: Patient is psychiatrically cleared. Patient case review and discussed with Dr. Rebecca Eaton, and patient does not meet inpatient criteria for inpatient psychiatric treatment. At time of discharge, patient denies SI, HI, AVH and can contract for safety. He demonstrated no overt evidence of psychosis or mania. Prior to discharge, he verbalized that they understood warning signs,  triggers, and symptoms of worsening mental health and how to access emergency mental health care if they felt it was needed. Patient was instructed to call 911 or return to the emergency room if they experienced any concerning symptoms after discharge. Patient voiced understanding and agreed to the above.  Patient given resources to follow up with behavioral health urgent care for therapy and medication management. Patient denies access to weapons.    Disposition: Discharge  Alona Bene, PMHNP 05/03/2023, 2:28 PM

## 2023-05-03 NOTE — ED Provider Notes (Addendum)
Emergency Medicine Observation Re-evaluation Note  Joseph [name] is a 27 y.o. male, seen on rounds today.  Pt initially presented to the ED for complaints of Paranoid Currently, the patient is awake and alert.  He was seen yesterday and had some hallucinations.  He also used amphetamines and cocaine.  He is doing better today.    Physical Exam  BP 122/72 (BP Location: Left Arm)   Pulse 71   Temp 98.1 F (36.7 C) (Oral)   Resp 20   SpO2 99%  Physical Exam General: awake and alert Cardiac: rr Lungs: clear Psych: calm  ED Course / MDM  EKG:   I have reviewed the labs performed to date as well as medications administered while in observation.  Recent changes in the last 24 hours include TTS eval.  Plan  Current plan is for re eval today.    Jacalyn Lefevre, MD 05/03/23 [phone]  Pt has been cleared by psych for d/c.  He is stable for d/c.  He is given resources for residential substance abuse tx centers.   Jacalyn Lefevre, MD 05/03/23 [id number]

## 2023-05-03 NOTE — ED Notes (Signed)
Patient alert and oriented this shift.  Patient cooperative and appropriate with staff. Patient guarded and quiet. No delusions noted. No paranoia noted.  No hallucinations noted. NO suicidal  ideation or homicidal ideation noted.
